# Patient Record
Sex: Male | Born: 1965 | Race: Black or African American | Hispanic: No | Marital: Single | Smoking: Never smoker
Health system: Southern US, Community
[De-identification: ages and names within clinical notes are randomized; demographics above are authoritative.]

## PROBLEM LIST (undated history)

## (undated) DIAGNOSIS — S46219A Strain of muscle, fascia and tendon of other parts of biceps, unspecified arm, initial encounter: Secondary | ICD-10-CM

## (undated) DIAGNOSIS — I10 Essential (primary) hypertension: Secondary | ICD-10-CM

## (undated) DIAGNOSIS — G4730 Sleep apnea, unspecified: Secondary | ICD-10-CM

## (undated) DIAGNOSIS — E785 Hyperlipidemia, unspecified: Secondary | ICD-10-CM

## (undated) DIAGNOSIS — M1990 Unspecified osteoarthritis, unspecified site: Secondary | ICD-10-CM

## (undated) DIAGNOSIS — Z6841 Body Mass Index (BMI) 40.0 and over, adult: Secondary | ICD-10-CM

## (undated) DIAGNOSIS — I214 Non-ST elevation (NSTEMI) myocardial infarction: Secondary | ICD-10-CM

## (undated) DIAGNOSIS — S82899A Other fracture of unspecified lower leg, initial encounter for closed fracture: Secondary | ICD-10-CM

## (undated) DIAGNOSIS — S43439A Superior glenoid labrum lesion of unspecified shoulder, initial encounter: Secondary | ICD-10-CM

## (undated) DIAGNOSIS — E039 Hypothyroidism, unspecified: Secondary | ICD-10-CM

## (undated) DIAGNOSIS — I219 Acute myocardial infarction, unspecified: Secondary | ICD-10-CM

## (undated) DIAGNOSIS — S93439A Sprain of tibiofibular ligament of unspecified ankle, initial encounter: Secondary | ICD-10-CM

## (undated) HISTORY — DX: Hypothyroidism, unspecified: E03.9

## (undated) HISTORY — PX: HERNIA REPAIR: SHX51

## (undated) HISTORY — DX: Morbid (severe) obesity due to excess calories: E66.01

## (undated) HISTORY — DX: Sprain of tibiofibular ligament of unspecified ankle, initial encounter: S93.439A

## (undated) HISTORY — DX: Non-ST elevation (NSTEMI) myocardial infarction: I21.4

## (undated) HISTORY — PX: WISDOM TOOTH EXTRACTION: SHX21

## (undated) HISTORY — DX: Unspecified osteoarthritis, unspecified site: M19.90

## (undated) HISTORY — DX: Hyperlipidemia, unspecified: E78.5

## (undated) HISTORY — PX: ANKLE SURGERY: SHX546

## (undated) HISTORY — DX: Superior glenoid labrum lesion of unspecified shoulder, initial encounter: S43.439A

## (undated) HISTORY — DX: Body Mass Index (BMI) 40.0 and over, adult: Z684

## (undated) HISTORY — DX: Other fracture of unspecified lower leg, initial encounter for closed fracture: S82.899A

## (undated) HISTORY — PX: KNEE ARTHROSCOPY: SUR90

## (undated) HISTORY — PX: THUMB ARTHROSCOPY: SHX2509

## (undated) HISTORY — PX: COLONOSCOPY: SHX174

## (undated) HISTORY — PX: BACK SURGERY: SHX140

---

## 1998-06-10 ENCOUNTER — Emergency Department (HOSPITAL_COMMUNITY): Admission: EM | Admit: 1998-06-10 | Discharge: 1998-06-10 | Payer: Self-pay | Admitting: Emergency Medicine

## 2004-07-27 ENCOUNTER — Ambulatory Visit: Payer: Self-pay | Admitting: Family Medicine

## 2005-12-22 ENCOUNTER — Ambulatory Visit: Payer: Self-pay | Admitting: Family Medicine

## 2007-05-10 ENCOUNTER — Ambulatory Visit: Payer: Self-pay | Admitting: Family Medicine

## 2007-05-11 LAB — CONVERTED CEMR LAB
ALT: 25 units/L (ref 0–53)
AST: 26 units/L (ref 0–37)
Albumin: 3.4 g/dL — ABNORMAL LOW (ref 3.5–5.2)
Alkaline Phosphatase: 70 units/L (ref 39–117)
BUN: 11 mg/dL (ref 6–23)
Basophils Absolute: 0 10*3/uL (ref 0.0–0.1)
Basophils Relative: 0.3 % (ref 0.0–1.0)
Bilirubin, Direct: 0.1 mg/dL (ref 0.0–0.3)
CO2: 31 meq/L (ref 19–32)
Calcium: 9.5 mg/dL (ref 8.4–10.5)
Chloride: 104 meq/L (ref 96–112)
Cholesterol: 229 mg/dL (ref 0–200)
Creatinine, Ser: 1.1 mg/dL (ref 0.4–1.5)
Direct LDL: 179.1 mg/dL
Eosinophils Absolute: 0.5 10*3/uL (ref 0.0–0.6)
Eosinophils Relative: 7.5 % — ABNORMAL HIGH (ref 0.0–5.0)
GFR calc Af Amer: 94 mL/min
GFR calc non Af Amer: 78 mL/min
Glucose, Bld: 91 mg/dL (ref 70–99)
HCT: 41.3 % (ref 39.0–52.0)
HDL: 35.4 mg/dL — ABNORMAL LOW (ref >39.0)
Hemoglobin: 14.3 g/dL (ref 13.0–17.0)
Lymphocytes Relative: 33.1 % (ref 12.0–46.0)
MCHC: 34.6 g/dL (ref 30.0–36.0)
MCV: 85.6 fL (ref 78.0–100.0)
Monocytes Absolute: 0.7 10*3/uL (ref 0.2–0.7)
Monocytes Relative: 9.6 % (ref 3.0–11.0)
Neutro Abs: 3.4 10*3/uL (ref 1.4–7.7)
Neutrophils Relative %: 49.5 % (ref 43.0–77.0)
Platelets: 256 10*3/uL (ref 150–400)
Potassium: 4.5 meq/L (ref 3.5–5.1)
RBC: 4.82 M/uL (ref 4.22–5.81)
RDW: 13.6 % (ref 11.5–14.6)
Sodium: 142 meq/L (ref 135–145)
TSH: 7.26 microintl units/mL — ABNORMAL HIGH (ref 0.35–5.50)
Total Bilirubin: 1 mg/dL (ref 0.3–1.2)
Total CHOL/HDL Ratio: 6.5
Total Protein: 7.9 g/dL (ref 6.0–8.3)
Triglycerides: 78 mg/dL (ref 0–149)
VLDL: 16 mg/dL (ref 0–40)
WBC: 6.9 10*3/uL (ref 4.5–10.5)

## 2007-08-13 ENCOUNTER — Ambulatory Visit: Payer: Self-pay | Admitting: Family Medicine

## 2007-08-14 LAB — CONVERTED CEMR LAB
ALT: 20 units/L (ref 0–53)
AST: 19 units/L (ref 0–37)
Cholesterol: 239 mg/dL (ref 0–200)
Direct LDL: 183.9 mg/dL
Free T4: 0.7 ng/dL (ref 0.6–1.6)
HDL: 34.2 mg/dL — ABNORMAL LOW (ref >39.0)
TSH: 9.4 microintl units/mL — ABNORMAL HIGH (ref 0.35–5.50)
Total CHOL/HDL Ratio: 7
Triglycerides: 112 mg/dL (ref 0–149)
VLDL: 22 mg/dL (ref 0–40)

## 2009-07-20 ENCOUNTER — Ambulatory Visit: Payer: Self-pay | Admitting: Family Medicine

## 2009-07-20 DIAGNOSIS — M2020 Hallux rigidus, unspecified foot: Secondary | ICD-10-CM | POA: Insufficient documentation

## 2009-12-14 ENCOUNTER — Ambulatory Visit: Payer: Self-pay | Admitting: Family Medicine

## 2010-01-04 ENCOUNTER — Ambulatory Visit: Payer: Self-pay | Admitting: Family Medicine

## 2010-01-04 DIAGNOSIS — R5383 Other fatigue: Secondary | ICD-10-CM

## 2010-01-04 DIAGNOSIS — R5381 Other malaise: Secondary | ICD-10-CM | POA: Insufficient documentation

## 2010-01-05 LAB — CONVERTED CEMR LAB
ALT: 21 units/L (ref 0–53)
AST: 21 units/L (ref 0–37)
Albumin: 4.1 g/dL (ref 3.5–5.2)
Alkaline Phosphatase: 66 units/L (ref 39–117)
BUN: 11 mg/dL (ref 6–23)
Basophils Absolute: 0.1 10*3/uL (ref 0.0–0.1)
Basophils Relative: 0.7 % (ref 0.0–3.0)
Bilirubin, Direct: 0.2 mg/dL (ref 0.0–0.3)
CO2: 31 meq/L (ref 19–32)
Calcium: 9.3 mg/dL (ref 8.4–10.5)
Chloride: 103 meq/L (ref 96–112)
Cholesterol: 262 mg/dL — ABNORMAL HIGH (ref 0–200)
Creatinine, Ser: 1.1 mg/dL (ref 0.4–1.5)
Direct LDL: 210 mg/dL
Eosinophils Absolute: 0.5 10*3/uL (ref 0.0–0.7)
Eosinophils Relative: 5.7 % — ABNORMAL HIGH (ref 0.0–5.0)
GFR calc non Af Amer: 91.07 mL/min (ref >60)
Glucose, Bld: 86 mg/dL (ref 70–99)
HCT: 43.6 % (ref 39.0–52.0)
HDL: 41.7 mg/dL (ref >39.00)
Hemoglobin: 14.5 g/dL (ref 13.0–17.0)
Lymphocytes Relative: 33.1 % (ref 12.0–46.0)
Lymphs Abs: 2.7 10*3/uL (ref 0.7–4.0)
MCHC: 33.1 g/dL (ref 30.0–36.0)
MCV: 86.9 fL (ref 78.0–100.0)
Monocytes Absolute: 0.8 10*3/uL (ref 0.1–1.0)
Monocytes Relative: 9.2 % (ref 3.0–12.0)
Neutro Abs: 4.2 10*3/uL (ref 1.4–7.7)
Neutrophils Relative %: 51.3 % (ref 43.0–77.0)
PSA: 0.38 ng/mL (ref 0.10–4.00)
Platelets: 247 10*3/uL (ref 150.0–400.0)
Potassium: 4.4 meq/L (ref 3.5–5.1)
RBC: 5.02 M/uL (ref 4.22–5.81)
RDW: 15.7 % — ABNORMAL HIGH (ref 11.5–14.6)
Sodium: 144 meq/L (ref 135–145)
TSH: 16.58 microintl units/mL — ABNORMAL HIGH (ref 0.35–5.50)
Total Bilirubin: 0.9 mg/dL (ref 0.3–1.2)
Total CHOL/HDL Ratio: 6
Total Protein: 7.3 g/dL (ref 6.0–8.3)
Triglycerides: 128 mg/dL (ref 0.0–149.0)
VLDL: 25.6 mg/dL (ref 0.0–40.0)
WBC: 8.2 10*3/uL (ref 4.5–10.5)

## 2010-01-06 ENCOUNTER — Ambulatory Visit: Payer: Self-pay | Admitting: Family Medicine

## 2010-01-06 DIAGNOSIS — E039 Hypothyroidism, unspecified: Secondary | ICD-10-CM | POA: Insufficient documentation

## 2010-01-06 DIAGNOSIS — E785 Hyperlipidemia, unspecified: Secondary | ICD-10-CM | POA: Insufficient documentation

## 2010-04-06 ENCOUNTER — Encounter (INDEPENDENT_AMBULATORY_CARE_PROVIDER_SITE_OTHER): Payer: Self-pay | Admitting: *Deleted

## 2010-06-29 NOTE — Assessment & Plan Note (Signed)
Summary: DERMITITIS??   Vital Signs:  Patient profile:   45 year old male Weight:      319.13 pounds Temp:     97.7 degrees F oral Pulse rate:   76 / minute Pulse rhythm:   regular BP sitting:   128 / 90  (left arm) Cuff size:   large  Vitals Entered By: Janee Morn, CMA 12/14/09, 8:15am CC: ? Dermitiis   History of Present Illness:  Dermatitis on lower abdomen, diffusely, has used some cream from Armida Sans, some antibiotic oitnment and Carmex.  Itchy and painful.   ROS: GEN: No acute illnesses, no fevers, chills, sweats, fatigue, weight loss, or URI sx. GI: No n/v/d Pulm: No SOB, cough, wheezing Interactive and getting along well at home.  Otherwise, ROS is as per the HPI.   GEN: Well-developed,well-nourished,in no acute distress; alert,appropriate and cooperative throughout examination HEENT: Normocephalic and atraumatic without obvious abnormalities. No apparent alopecia or balding. Ears, externally no deformities large ventral hernia PULM: Breathing comfortably in no respiratory distress EXT: No clubbing, cyanosis, or edema PSYCH: Normally interactive. Cooperative during the interview. Pleasant. Friendly and conversant. Not anxious or depressed appearing. Normal, full affect.   SKIN: very large area of unelevated scale on abdomen  Allergies (verified): No Known Drug Allergies  Past History:  Past medical, surgical, family and social histories (including risk factors) reviewed, and no changes noted (except as noted below).  Past Medical History: Reviewed history from 07/20/2009 and no changes required. h/o complete syndesmotic disruption and fixation, L h/o ankle fx L, x 2 Morbidly obese  Past Surgical History: Reviewed history from 05/09/2007 and no changes required. Ankle surgery Right knee arthroscopies Left thumb surgery PMH-FH-SH reviewed-no changes except otherwise noted  Family History: Reviewed history from 05/10/2007 and no changes  required. distant maternal relatives with DM no heart disease in family mother HTN  Social History: Reviewed history from 05/09/2007 and no changes required. Marital Status: Married Children:  Occupation: Midwife- PE teacher   Impression & Recommendations:  Problem # 1:  CONTACT DERMATITIS&OTHER ECZEMA DUE UNSPEC CAUSE (ICD-692.9)  His updated medication list for this problem includes:    Triamcinolone Acetonide 0.1 % Crea (Triamcinolone acetonide) .Marland Kitchen... Apply two times a day to affected areas  Complete Medication List: 1)  Triamcinolone Acetonide 0.1 % Crea (Triamcinolone acetonide) .... Apply two times a day to affected areas  Patient Instructions: 1)  Prephysical Labs, several days before, fasting 2)  BMP, HFP, FLP, CBC with diff, TSH, PSA: v77.91, v77.1, ,780.79, v76.44  Prescriptions: TRIAMCINOLONE ACETONIDE 0.1 % CREA (TRIAMCINOLONE ACETONIDE) Apply two times a day to affected areas  #1 pound jar x 1   Entered and Authorized by:   Hannah Beat MD   Signed by:   Hannah Beat MD on 12/14/2009   Method used:   Electronically to        [facility]  [address] (retail)       [address]       [place], [place]  [address]       Ph: [phone] or [phone]       Fax: [phone]   RxID:   [id number]   Current Allergies (reviewed today): No known allergies

## 2010-06-29 NOTE — Assessment & Plan Note (Signed)
Summary: PAIN IN BOTH FEET   Vital Signs:  Patient profile:   45 year old male Height:      72 inches Weight:      317.4 pounds BMI:     43.20 Temp:     98.4 degrees F oral Pulse rate:   84 / minute Pulse rhythm:   regular BP sitting:   130 / 90  (left arm) Cuff size:   large  Vitals Entered By: Benny Lennert CMA Duncan Dull) (July 20, 2009 10:25 AM)  History of Present Illness: Chief complaint pain in both feet  45 year old male, BMI 21 with B foot pain:  For about the last four or five months, but maybe even last spring with pain, R > L. More medially compared to laterally.  Woke up one morning and could not walk until he put his shoes on.  Has been happening more and more.   When standing long times, it will hurt a long time. 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    L ankle: had some surgery - syndesmotic screw placed. Bad high grade ankle sprain and broke ankle twice in college.   Right now the right is hurting more.  Allergies (verified): No Known Drug Allergies  Past History:  Past medical, surgical, family and social histories (including risk factors) reviewed, and no changes noted (except as noted below).  Past Medical History: h/o complete syndesmotic disruption and fixation, L h/o ankle fx L, x 2 Morbidly obese  Past Surgical History: Reviewed history from 05/09/2007 and no changes required. Ankle surgery Right knee arthroscopies Left thumb surgery  Family History: Reviewed history from 05/10/2007 and no changes required. distant maternal relatives with DM no heart disease in family mother HTN  Social History: Reviewed history from 05/09/2007 and no changes required. Marital Status: Married Children:  Occupation: Midwife- PE teacher  Review of Systems       REVIEW OF SYSTEMS  GEN: No systemic complaints, no fevers, chills, sweats, or other acute illnesses MSK: Detailed in the HPI GI:  tolerating PO intake without difficulty Neuro: No numbness, parasthesias, or tingling associated. Otherwise the pertinent positives of the ROS are noted above.    Physical Exam  General:  GEN: Well-developed,well-nourished,in no acute distress; alert,appropriate and cooperative throughout examination HEENT: Normocephalic and atraumatic without obvious abnormalities. No apparent alopecia or balding. Ears, externally no deformities PULM: Breathing comfortably in no respiratory distress EXT: No clubbing, cyanosis, or edema PSYCH: Normally interactive. Cooperative during the interview. Pleasant. Friendly and conversant. Not anxious or depressed appearing. Normal, full affect.  Msk:  B FEET Echymosis: no Edema: no ROM: full LE B Gait: heel toe, severe pronation MT pain: no Lateral Mall: NT Medial Mall: NT Talus: NT Navicular: NT Cuboid: NT Calcaneous: NT Metatarsals: NT 5th MT: NT Phalanges: NT Achilles: NT Plantar Fascia: NT Fat Pad: NT Peroneals: mild TTP, R > L Post Tib: TTP, R > L Great Toe: approx 40% loss of motion Ant Drawer: neg ATFL: NT CFL: NT Deltoid: NT Long arch: severe pes planus Transverse arch: severe breakdown with large bunions, bunionettes Hindfoot breakdown: moderate to severe Sensation: intact    Impression & Recommendations:  Problem # 1:  TENDINITIS TIBIALIS (ICD-726.72) Complex foot case, severe foot breakdown, leading to probable pT and peroneal tendonitis. 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 Apply to affected area 4 times daily  Patient Instructions: 1)  Excellent Over the Counter Orthotics: 2)   ***Hapad: available at www.hapad.com 3)  ***SPENCO: Available at some sports stores or www.amazon.com 4)  SHOES: Danskos, Merrells, Keens, Clarks - good arch support, want minimal bendability 5)  [facility] [place]: excellent staff, shoe selection, OTC orthotics 6)  Shoes: Birkenstock shoes, [employer] 7)  ***[employer], [address] [place], [place]***  8)  BALANCING EXERCISES 9)  SCRUNCH UP TOWEL WITH TOES 10)  PICK UP PING PONG BALLS WITH TOES  11)  BALANCE ON ONE FOOT 12)  Prephysical Labs, several days before, fasting 13)  BMP, HFP, FLP, CBC with diff, TSH, PSA: v77.91, v77.1, ,780.79, v76.44  Prescriptions: VOLTAREN 1 %  GEL (DICLOFENAC SODIUM) Apply to affected area 4 times daily  #5 tubes x 11   Entered and Authorized by:   Hannah Beat MD   Signed by:   Hannah Beat MD on 07/20/2009   Method used:   Print then Give to Patient   RxID:   [id number]   Current Allergies (reviewed today): No known allergies

## 2010-06-29 NOTE — Assessment & Plan Note (Signed)
Summary: CPX./DLO   Vital Signs:  Patient profile:   45 year old male Height:      72 inches Weight:      315.6 pounds BMI:     42.96 Temp:     99.0 degrees F oral Pulse rate:   76 / minute Pulse rhythm:   regular BP sitting:   130 / 90  (left arm) Cuff size:   large  Vitals Entered By: Benny Lennert CMA Duncan Dull) (January 06, 2010 2:34 PM)  History of Present Illness: Chief complaint cpx  45 year old male:  Hyperlipidemia: significantly elevated lipid panel with an LDL greater than 200. Not on any current medication. More than obesity, and an active.  colon prostate, father with a current history of prostate cancer  thyroid: significantly elevated TSH, new diagnosis of hypothyroidism  fungus:RIGHT underarm and anterior abdomen, lower with a hypopigmented area of some minimal scale with some evidence of excoriation  in and around  and under the pannus area and into the groin folds.  Clinical Review Panels:  Prevention   Last PSA:  0.38 (01/04/2010)  Lipid Management   Cholesterol:  262 (01/04/2010)   LDL (bad choesterol):  DEL (08/13/2007)   HDL (good cholesterol):  41.70 (01/04/2010)  CBC   WBC:  8.2 (01/04/2010)   RBC:  5.02 (01/04/2010)   Hgb:  14.5 (01/04/2010)   Hct:  43.6 (01/04/2010)   Platelets:  247.0 (01/04/2010)   MCV  86.9 (01/04/2010)   MCHC  33.1 (01/04/2010)   RDW  15.7 (01/04/2010)   PMN:  51.3 (01/04/2010)   Lymphs:  33.1 (01/04/2010)   Monos:  9.2 (01/04/2010)   Eosinophils:  5.7 (01/04/2010)   Basophil:  0.7 (01/04/2010)  Complete Metabolic Panel   Glucose:  86 (01/04/2010)   Sodium:  144 (01/04/2010)   Potassium:  4.4 (01/04/2010)   Chloride:  103 (01/04/2010)   CO2:  31 (01/04/2010)   BUN:  11 (01/04/2010)   Creatinine:  1.1 (01/04/2010)   Albumin:  4.1 (01/04/2010)   Total Protein:  7.3 (01/04/2010)   Calcium:  9.3 (01/04/2010)   Total Bili:  0.9 (01/04/2010)   Alk Phos:  66 (01/04/2010)   SGPT (ALT):  21 (01/04/2010)   SGOT  (AST):  21 (01/04/2010)   Allergies (verified): No Known Drug Allergies  Past History:  Past medical, surgical, family and social histories (including risk factors) reviewed, and no changes noted (except as noted below).  Past Medical History: Reviewed history from 07/20/2009 and no changes required. h/o complete syndesmotic disruption and fixation, L h/o ankle fx L, x 2 Morbidly obese  Past Surgical History: Reviewed history from 05/09/2007 and no changes required. Ankle surgery Right knee arthroscopies Left thumb surgery  Family History: Reviewed history from 05/10/2007 and no changes required. distant maternal relatives with DM no heart disease in family mother HTN  Social History: Reviewed history from 05/09/2007 and no changes required. 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 Eyes: Denies blurring. ENT: Denies earache, ear discharge, decreased hearing, nasal congestion, and sore throat. CV: Denies chest pains, dyspnea on exertion, palpitations, and syncope. Resp: Denies cough, cough with exercise, dyspnea at rest, excessive sputum, nighttime cough or wheeze, and wheezing GI: Denies nausea, vomiting, diarrhea, constipation, change in bowel habits, abdominal pain, melena, BRBPR  GU: dysuria, discharge, frequency,genital sores, STD concern. MS: no back pain, joint pain, stiffness, and arthritis. Derm:as above Neuro:  No abnormal gait, frequent headaches, paresthesias, seizures, vertigo, and weakness Psych: No anxiety, behavioral problems, compulsive behavior, depression, hyperactivity, and inattentive.having some difficulties at home with his wife, there currently living in separate bedrooms, and concern that he may ultimately get divorced. Endo: No polydipsia, polyphagia, polyuria, and unusual weight change Heme: No bruising or LAD Allergy: No urticaria  or hayfever   Otherwise, the pertinent positives and negatives are listed above and in the HPI, otherwise a full review of systems has been reviewed and is negative unless noted positive.    Impression & Recommendations:  Problem # 1:  HEALTH MAINTENANCE EXAM (ICD-V70.0) The patient's preventative maintenance and recommended screening tests for an annual wellness exam were reviewed in full today. Brought up to date unless services declined.  Counselled on the importance of diet, exercise, and its role in overall health and mortality. The patient's FH and SH was reviewed, including their home life, tobacco status, and drug and alcohol status.   Lose weight  Problem # 2:  HYPOTHYROIDISM (ICD-244.9) Assessment: New >25 minutes spent in face to face time with patient, >50% spent in counselling or coordination of care: this time is spent over and above health maintenance exam reviewing new onset hypothyroidism, new onset hyperlipidemia, and the patient is having some significant home stress due to marital difficulties.  We discussed his home life with  his children, stepchildren, white, in-laws, and  the discord in his household.  He could potentially have an upcoming divorce.  His updated medication list for this problem includes:    Synthroid 100 Mcg Tabs (Levothyroxine sodium) .Marland Kitchen... 1 by mouth daily  Labs Reviewed: TSH: 16.58 (01/04/2010)    Chol: 262 (01/04/2010)   HDL: 41.70 (01/04/2010)   LDL: DEL (08/13/2007)   TG: 128.0 (01/04/2010)  Problem # 3:  HYPERLIPIDEMIA (ICD-272.4) Assessment: New  His updated medication list for this problem includes:    Pravastatin Sodium 40 Mg Tabs (Pravastatin sodium) ..... One by mouth at bedtime  Labs Reviewed: SGOT: 21 (01/04/2010)   SGPT: 21 (01/04/2010)   HDL:41.70 (01/04/2010), 34.2 (08/13/2007)  LDL:DEL (08/13/2007), DEL (05/10/2007)  Chol:262 (01/04/2010), 239 (08/13/2007)  Trig:128.0 (01/04/2010), 112 (08/13/2007)  Problem # 4:   COUNSELING MARITAL&PARTNER PROBLEMS UNSPECIFIED (ICD-V61.10) Assessment: New  Complete Medication List: 1)  Triamcinolone Acetonide 0.1 % Crea (Triamcinolone acetonide) .... Apply two times a day to affected areas 2)  Pravastatin Sodium 40 Mg Tabs (Pravastatin sodium) .... One by mouth at bedtime 3)  Synthroid 100 Mcg Tabs (Levothyroxine sodium) .Marland Kitchen.. 1 by mouth daily  Patient Instructions: 1)  f/u 3 months 2)  labs 3-4 days before 3)  Hepatic Panel prior to visit ICD-9: v58.69 4)  Lipid panel prior to visit ICD-9 : 272.4 5)  TSH prior to visit ICD-9 : Free T4, Free T3, 244.9 6)  ADD IN TOPICAL CLOTRIMAZOLE CREAM 2-3 TIMES A DAY Prescriptions: SYNTHROID 100 MCG TABS (LEVOTHYROXINE SODIUM) 1 by mouth daily  #30 x 5   Entered and Authorized by:   Hannah Beat MD   Signed by:   Hannah Beat MD on 01/06/2010   Method used:   Print then Give to Patient   RxID:   [id number] PRAVASTATIN SODIUM 40 MG  TABS (PRAVASTATIN SODIUM) one by mouth at bedtime  #90 x 3   Entered and Authorized by:   Hannah Beat MD   Signed by:   Hannah Beat MD on 01/06/2010   Method used:   Print then Give to Patient   RxID:  [id number]   Current Allergies (reviewed today): No known allergies    General Medical Physical Exam:  General Appearance:      GEN: Well-developed,well-nourished,in no acute distress; alert,appropriate and cooperative throughout examination HEENT: Normocephalic and atraumatic without obvious abnormalities. No apparent alopecia or balding. Ears, externally no deformities PULM: Breathing comfortably in no respiratory distress EXT: No clubbing, cyanosis, or edema PSYCH: Normally interactive. Cooperative during the interview. Pleasant. Friendly and conversant. Not anxious or depressed appearing. Normal, full affect.   Head:      Inspection:     normocephalic without obvious abnormalities      Palpation:     no abnormal lesions palpable  Eyes:      External:      conjunctiva and lids normal      Pupils:     equal, round, and reactive to light and accommodation      Fundus:     discs sharp and flat; no a/v nicking, hemorrhages, or exudates  Ears, Nose, Throat:      External:     normocephalic, atraumatic, and no abnormalities observed.  no sinus tenderness      Otoscopic:     canals clear; tympanic membranes intact with normal light reflex      Hearing:     grossly intact      Nasal:     mucosal erythema and mucosal edema.        Dental:     good dentition      Pharynx:     tongue normal; posterior pharynx without erythema or exudate  Neck:      Neck:     supple; no masses; trachea midline      Thyroid:     no nodules, masses, tenderness, or enlargement  Respiratory:      Resp. effort:     no intercostal retractions or use of accessory muscles      Percussion:     no dullness      Palpation:     normal fremitus      Auscultation:     no rales, rhonchi, or wheezes  Chest Wall:      Chest wall:     no masses or gynecomastia      Axilla:     no axillary adenopathy  Cardiovascular:      Palpation:     no thrill or displacement of PMI      Auscultation:     normal S1 and  S2; no murmur, rub, or gallop  Gastrointestinal:      Abdomen:     soft and non-tender with normal bowel sounds; no masses      Liver/spleen:     normal to percussion; no enlargement or nodularity      Hernia:     no hernias      Rectal:     no masses or tenderness      Stool:     not done  Genitourinary:      Scrotum:     no lesions, cysts, edema, or rash      Penis:     no lesions or discharge      Prostate:     no enlargement or nodularity  Musculoskeletal:      Gait/station:     normal gait; no ataxia  Lymphatic:      Neck:     no cervical adenopathy      Inguinal:     no inguinal adenopathy  Skin:      Inspection:     as above, lichenified, hyperkeratotic, abdomen, right underarm  Neurological:      Sensory:     intact to touch  Psychiatric:       Judgement:     intact      Orientation:     oriented to time, place, and person      Memory:     intact for recent and remote events      Mood/affect:     no appearance of anxiety, depression, or agitation

## 2010-06-29 NOTE — Assessment & Plan Note (Signed)
Summary: REVIEW RESULTS /CLE   Vital Signs:  Patient Profile:   45 Years Old Male Weight:      319 pounds Temp:     97.9 degrees F oral Pulse rate:   72 / minute Pulse rhythm:   regular BP sitting:   142 / 100  (left arm) Cuff size:   large  Vitals Entered By: Lowella Petties (May 10, 2007 8:48 AM)                 Chief Complaint:  Review test results.  History of Present Illness: had gone to a PE convention- and did a free screening chol total was 265 HDL 30- no LDL sugar 100 blood pressure 127/90- pulse 95  lifestyle habits are not optimal is busy coaching- too busy for breakfast, lunch is leftovers is trying to avoid red meat- but wife fixes tacos and hamburgers does eat some veg and fruit does eat too much portion wise not a lot of salt- but does eat salty food    Current Allergies (reviewed today): No known allergies    Family History:    distant maternal relatives with DM    no heart disease in family    mother HTN   Serial Vital Signs/Assessments:  Time      Position  BP       Pulse  Resp  Temp     By                     140/90                         Judith Part MD    Review of Systems      See HPI  General      Denies chills, fatigue, loss of appetite, and weight loss.  Eyes      Denies blurring.  CV      Denies chest pain or discomfort.  Resp      Denies cough and shortness of breath.  GI      Denies abdominal pain.  MS      Complains of joint pain.      knees hurt to run- can swim and bike  Derm      Denies changes in color of skin and rash.  Neuro      Denies numbness.  Psych      mood is ok, but dissapointed in recent news  Endo      Denies excessive thirst and excessive urination.   Physical Exam  General:     overweight but generally well appearing  Head:     normocephalic, atraumatic, and no abnormalities observed.   Eyes:     vision grossly intact, pupils equal, pupils round, and pupils  reactive to light.   Mouth:     pharynx pink and moist.   Neck:     supple with full rom and no masses or thyromegally, no JVD or carotid bruit  Lungs:     Normal respiratory effort, chest expands symmetrically. Lungs are clear to auscultation, no crackles or wheezes. Heart:     Normal rate and regular rhythm. S1 and S2 normal without gallop, murmur, click, rub or other extra sounds. Abdomen:     soft and non-tender.  no renal bruits Pulses:     R and L carotid,radial,femoral,dorsalis pedis and posterior tibial pulses are full and equal bilaterally Extremities:     No  clubbing, cyanosis, edema, or deformity noted with normal full range of motion of all joints.   Neurologic:     sensation intact to light touch, gait normal, and DTRs symmetrical and normal.  -nl monofil test Skin:     Intact without suspicious lesions or rashes Cervical Nodes:     No lymphadenopathy noted Psych:     nl affect in general- pt is a little down    Impression & Recommendations:  Problem # 1:  ELEVATED BLOOD PRESSURE WITHOUT DIAGNOSIS OF HYPERTENSION (ICD-796.2) given info on lowering bp will start lifestyle change- wt loss, exercise and lowering sodium intake- disc this in detail will consider lifestyle center in future- pt states he has fairly good understanding of what to do if no imp at f/u will start med  Orders: Venipuncture (30865) TLB-BMP (Basic Metabolic Panel-BMET) (80048-METABOL) TLB-CBC Platelet - w/Differential (85025-CBCD) TLB-Hepatic/Liver Function Pnl (80076-HEPATIC) TLB-TSH (Thyroid Stimulating Hormone) (84443-TSH)   Problem # 2:  HYPERLIPIDEMIA (ICD-272.2) will work on lowering sat fat in diet, exercise, and consider omega 3 suppl checking labs today Orders: Venipuncture (78469) TLB-Lipid Panel (80061-LIPID)   Problem # 3:  OBESITY (ICD-278.00) will start working on wt loss cut portions- eat more regularly cut out junk food and snacks  start exerising 20-30 min daily   days week f/u 2-4 weeks   Patient Instructions: 1)  start healthier diet, smaller portions and exercise 2)  It is important that you exercise regularly at least 20 minutes 5 times a week. If you develop chest pain, have severe difficulty breathing, or feel very tired , stop exercising immediately and seek medical attention. 3)  follow up in about 2-4 weeks 4)  today am checking labs including cholesterol    ] Prior Medications (reviewed today): None Current Allergies (reviewed today): No known allergies

## 2010-06-29 NOTE — Letter (Signed)
Summary: [place] No Show Letter  [place] [facility]  [address] [place], [place] [address]   Phone: [phone]  Fax: [phone]    04/06/2010 MRN: [id number]  [facility] [address] [place], [place]  [address]   Dear Mr. [facility],   Our records indicate that you missed your scheduled appointment with __LAB___________________ on __11.8.2011__________.  Please contact this office to reschedule your appointment as soon as possible.  It is important that you keep your scheduled appointments with your physician, so we can provide you the best care possible.  Please be advised that there may be a charge for "no show" appointments.    Sincerely,   [place] at [facility]

## 2010-06-29 NOTE — Assessment & Plan Note (Signed)
Summary: ROA   Vital Signs:  Patient Profile:   45 Years Old Male Weight:      305 pounds Temp:     99.5 degrees F oral Pulse rate:   84 / minute Pulse rhythm:   regular BP sitting:   146 / 94  (left arm) Cuff size:   large  Vitals Entered By: Lowella Petties (August 13, 2007 10:36 AM)                 Serial Vital Signs/Assessments:  Time      Position  BP       Pulse  Resp  Temp     By                     138/90                         Judith Part MD   Chief Complaint:  Follow up.  History of Present Illness: started going to the gym since last visit -  thinks she has lost to 5- about 10-12 pounds--? then 305 this am  is also using the [employer]- likes the running and balance programs as well  is eating differently- overall  eating more soups and salads , with chicken stays away from red meat and fried foods   is overall feeling better labs from last visit HDL 35.4 - is not using any omega 3 supplements  trig 78 LDL 179.1 - very high  tsh was a little high has never had goiter or growth in neck once he started exercising energy level did go up   has been sick lately uri and cough (taking thera flu) now has some congestion in chest- cannot get rid of it  when lies down at night- cannot breath well , is wheezing some  does have hx of childhood asthma also has spring allergies  took one mucinex  has not had fever - but today 99.5 has been sick a lot more this year than in other years     Current Allergies: No known allergies    Family History:    Reviewed history from 05/10/2007 and no changes required:       distant maternal relatives with DM       no heart disease in family       mother HTN  Social History:    Reviewed history from 05/09/2007 and no changes required:       Marital Status: Married       Children:        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  no sinus tenderness Eyes:     vision grossly intact, pupils equal, pupils round, and pupils reactive to light.  no conjunctivits Ears:     R ear normal and L ear normal.   Nose:     mucosal erythema and mucosal edema.   Mouth:     pharynx pink and moist, no erythema, and no exudates.   Neck:     supple with full rom and no masses or thyromegally, no JVD or carotid bruit no tenderness Lungs:     harsh bs at bilateral bases without rales or labored breathing some faint rhonchi and scant end exp wheezes heard Heart:  Normal rate and regular rhythm. S1 and S2 normal without gallop, murmur, click, rub or other extra sounds. Pulses:     R and L carotid,radial,femoral,dorsalis pedis and posterior tibial pulses are full and equal bilaterally Extremities:     No clubbing, cyanosis, edema, or deformity noted with normal full range of motion of all joints.   Neurologic:     sensation intact to light touch, sensation intact to pinprick, gait normal, and DTRs symmetrical and normal.  no tremor Skin:     Intact without suspicious lesions or rashes Cervical Nodes:     No lymphadenopathy noted Psych:     normal affect, talkative and pleasant     Impression & Recommendations:  Problem # 1:  THYROID STIMULATING HORMONE, ABNORMAL (ICD-246.9) Assessment: New will re check tsh with FT4 today- and start tx with thyroid replacement if low pt does report some recent wt gain and lethargy- but has no prior problems with thyroid Orders: Venipuncture (82423) TLB-TSH (Thyroid Stimulating Hormone) (84443-TSH) TLB-T4 (Thyrox), Free [id number])   Problem # 2:  OBESITY (ICD-278.00) Assessment: Improved succeeding at initial effort at wt loss  enc to keep up the good work- this will continue to decrease numerous health risks  will f/u in 65mo to check on progress  Problem # 3:  HYPERLIPIDEMIA (ICD-272.2) will re  check labs today and expect improvement pt is not interested in chol med unless absolutely necessary  Orders: Venipuncture (40086) TLB-Lipid Panel (80061-LIPID) TLB-ALT (SGPT) (84460-ALT) TLB-AST (SGOT) (84450-SGOT)   Problem # 4:  ELEVATED BLOOD PRESSURE WITHOUT DIAGNOSIS OF HYPERTENSION (ICD-796.2) bp is still elevated- pt wants to continue working on diet and exercise will re eval in 3 mo  Problem # 5:  BRONCHITIS-ACUTE (ICD-466.0) with ongoing productive cough will cover with zithromax and sympt care (guifenisen ac)- urged to call/f/u if worse or not improved His updated medication list for this problem includes:    Zithromax Z-pak 250 Mg Tabs (Azithromycin) .Marland Kitchen... Take by mouth as directed    Guaifenesin-codeine 100-10 Mg/23ml Liqd (Guaifenesin-codeine) .Marland Kitchen... 1-2 tsp by mouth q 4-6 hours as needed cough    Proventil Hfa 108 (90 Base) Mcg/act Aers (Albuterol sulfate) .Marland Kitchen... 2 puffs q 4 hours as needed for wheezing or tight chest   Complete Medication List: 1)  Zithromax Z-pak 250 Mg Tabs (Azithromycin) .... Take by mouth as directed 2)  Guaifenesin-codeine 100-10 Mg/51ml Liqd (Guaifenesin-codeine) .Marland Kitchen.. 1-2 tsp by mouth q 4-6 hours as needed cough 3)  Proventil Hfa 108 (90 Base) Mcg/act Aers (Albuterol sulfate) .... 2 puffs q 4 hours as needed for wheezing or tight chest   Patient Instructions: 1)  keep up the good work with diet and exercise and weight loss 2)  try omega 3 supplement like fish oil or flax seed oil as directed to try to increase HDL (good cholesterol) 3)  for cough- take mucinex during the day and try the guifenesin with codiene at night  4)  take the zithromax as directed 5)  albuterol mdi as needed for wheezing or tightness 6)  if not improving or any shortness of breath- please update me  7)  when labs return- I will let you know if you need to start thyroid medication  8)  follow up in 3 months    Prescriptions: PROVENTIL HFA 108 (90 BASE) MCG/ACT  AERS  (ALBUTEROL SULFATE) 2 puffs q 4 hours as needed for wheezing or tight chest  #1 mdi x 1   Entered and Authorized by:  Judith Part MD   Signed by:   Judith Part MD on 08/13/2007   Method used:   Print then Give to Patient   RxID:   [id number] GUAIFENESIN-CODEINE 100-10 MG/5ML  LIQD (GUAIFENESIN-CODEINE) 1-2 tsp by mouth q 4-6 hours as needed cough  #120 cc x 0   Entered and Authorized by:   Judith Part MD   Signed by:   Judith Part MD on 08/13/2007   Method used:   Print then Give to Patient   RxID:   [id number] ZITHROMAX Z-PAK 250 MG  TABS (AZITHROMYCIN) take by mouth as directed  #1 pk x 0   Entered and Authorized by:   Judith Part MD   Signed by:   Judith Part MD on 08/13/2007   Method used:   Print then Give to Patient   RxID:   [id number]  ] Prior Medications: Current Allergies: No known allergies

## 2011-02-14 ENCOUNTER — Ambulatory Visit (INDEPENDENT_AMBULATORY_CARE_PROVIDER_SITE_OTHER): Payer: BC Managed Care – PPO | Admitting: Internal Medicine

## 2011-02-14 ENCOUNTER — Encounter: Payer: Self-pay | Admitting: Internal Medicine

## 2011-02-14 VITALS — BP 148/96 | HR 75 | Temp 98.0°F | Ht 72.0 in | Wt 328.0 lb

## 2011-02-14 DIAGNOSIS — K436 Other and unspecified ventral hernia with obstruction, without gangrene: Secondary | ICD-10-CM

## 2011-02-14 NOTE — Assessment & Plan Note (Signed)
Sounds like he had brief episode of strangulation this morning Suspect he had extension of additional bowel into hernia after moving chairs around and this caused the strangulation Better after home and lying down  Needs surgery---immediate if recurrent pain Will set up with surgeon for elective repair if pain doesn't recur

## 2011-02-14 NOTE — Progress Notes (Signed)
  Subjective:    Patient ID: Steve Ashley, male    DOB: 1966/04/19, 45 y.o.   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 Then got severe pain, hard to breathe. Went home, fell asleep for an hour or so and it improved Still hurts ---worsening when moving around  Has had protrusion above umbilicus for some time Has been able to feel but never hurt like this before  No current outpatient prescriptions on file prior to visit.    No Known Allergies  Past Medical History  Diagnosis Date  . Syndesmotic ankle sprain   . Ankle fracture   . Morbid obesity     Past Surgical History  Procedure Date  . Ankle surgery   . Knee arthroscopy     right  . Thumb arthroscopy     Family History  Problem Relation Age of Onset  . Hypertension Mother   . Heart disease Neg Hx     History   Social History  . Marital Status: Married    Spouse Name: N/A    Number of Children: N/A  . Years of Education: N/A   Occupational History  . PE teacher    Social History Main Topics  . Smoking status: Never Smoker   . Smokeless tobacco: Never Used  . Alcohol Use: No  . Drug Use: No  . Sexually Active: Not on file   Other Topics Concern  . Not on file   Social History Narrative  . No narrative on file   Review of Systems No nausea or vomiting Ate breakfast, but nothing since then Normal stool last night     Objective:   Physical Exam  Constitutional: He appears well-developed and well-nourished. No distress.  Abdominal:       Incarcerated ventral hernia just above the umbilicus Mild tenderness now No rebound          Assessment & Plan:

## 2011-02-15 ENCOUNTER — Ambulatory Visit (INDEPENDENT_AMBULATORY_CARE_PROVIDER_SITE_OTHER): Payer: Self-pay | Admitting: General Surgery

## 2011-02-18 ENCOUNTER — Encounter (INDEPENDENT_AMBULATORY_CARE_PROVIDER_SITE_OTHER): Payer: Self-pay | Admitting: General Surgery

## 2011-02-18 ENCOUNTER — Ambulatory Visit (INDEPENDENT_AMBULATORY_CARE_PROVIDER_SITE_OTHER): Payer: BC Managed Care – PPO | Admitting: General Surgery

## 2011-02-18 VITALS — BP 132/94 | HR 68 | Temp 97.1°F | Resp 12 | Ht 71.0 in | Wt 328.4 lb

## 2011-02-18 DIAGNOSIS — K436 Other and unspecified ventral hernia with obstruction, without gangrene: Secondary | ICD-10-CM

## 2011-02-18 NOTE — Progress Notes (Signed)
Chief Complaint  Patient presents with  . Other    new pt- eval ventral hernia    HPI Steve Ashley is a 45 y.o. male.  HPI 45 year old morbidly obese African American male referred by Dr. Alphonsus Sias for evaluation of an incarcerated ventral hernia. The patient states that he has had a lifelong hernia at his umbilicus. However, over the past several years the bulge above his umbilicus has slowly gotten larger. He states particularly the last 6-8 months he's been having more discomfort and intermittent episodes of pain at this bulge. This past Monday while at work he developed a sharp pain at his umbilicus. The pain got so intense at one point that he had difficulty breathing. He had to leave work early. The pain eventually decrease in intensity. He denies any nausea or vomiting. He denies any fevers or chills. He denies any diarrhea or constipation. He denies any melena or hematochezia. He denies any prior abdominal surgery. 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 Syndesmotic ankle sprain   . Ankle fracture   . Morbid obesity   . Arthritis   . Asthma   . Hyperlipidemia     Past Surgical History  Procedure Date  . Ankle surgery   . Knee arthroscopy     right  . Thumb arthroscopy     Family History  Problem Relation Age of Onset  . Hypertension Mother   . Heart disease Neg Hx   . Cancer Father     prostate    Social History History  Substance Use Topics  . Smoking status: Never Smoker   . Smokeless tobacco: Never Used  . Alcohol Use: No    No Known Allergies  No current outpatient prescriptions on file.    Review of Systems Review of Systems  Constitutional: Negative for fever, chills, appetite change and unexpected weight change.  HENT: Negative for hearing loss and neck pain.   Eyes: Negative for photophobia and visual disturbance.  Respiratory: Negative for cough, chest tightness and shortness of  breath.        +snoring  Cardiovascular: Negative for chest pain and leg swelling.       Denies DOE, orthopnea, PND  Gastrointestinal: Negative for nausea, vomiting, diarrhea, constipation and blood in stool.  Genitourinary: Positive for urgency. Negative for dysuria, decreased urine volume, discharge and penile pain.  Musculoskeletal: Negative.   Neurological: Negative.   Hematological: Negative.   Psychiatric/Behavioral: Negative.        Some stress, separated from wife    Blood pressure 132/94, pulse 68, temperature 97.1 F (36.2 C), temperature source Temporal, resp. rate 12, height 5\' 11"  (1.803 m), weight 328 lb 6.4 oz (148.961 kg).  Physical Exam Physical Exam  Constitutional: He is oriented to person, place, and time. He appears well-developed and well-nourished.       Morbidly obese  HENT:  Head: Normocephalic and atraumatic.       Short thick neck  Eyes: Conjunctivae are normal. No scleral icterus.  Neck: Normal range of motion. No JVD present. No tracheal deviation present.  Cardiovascular: Normal rate, regular rhythm and normal heart sounds.   Pulmonary/Chest: Effort normal and breath sounds normal. No respiratory distress. He has no wheezes.  Abdominal: Soft. Bowel sounds are normal. He exhibits no distension. There is no rebound and no guarding.    Musculoskeletal: Normal range of motion.  Lymphadenopathy:    He has no  cervical adenopathy.  Neurological: He is alert and oriented to person, place, and time. He exhibits normal muscle tone.  Skin: Skin is warm and dry.       Hyperpigmentation of lower abd wall c/w acanthosis nigricans  Psychiatric: He has a normal mood and affect. His behavior is normal. Judgment and thought content normal.    Data Reviewed Referring provider's note Labs from 12/2009 which showed elevated cholesterol  Assessment    Morbid obesity Hyperlipidemia Incarcerated Ventral Hernia    Plan    We discussed the etiology of ventral  hernias. He was given Agricultural engineer. We discussed the signs and symptoms of strangulation. He was told what he should do should those symptoms occur.  Because his hernia is incarcerated, I have recommended repair. Obviously he is at higher risk for recurrence and perioperative complications given his morbid obesity. We discussed both open and laparoscopic approaches. We discussed the risks and benefits of surgery including but not limited to bleeding, infection, injury to surrounding structures, blood clot formation, general anesthesia risk, prolonged abdominal pain, urinary retention, mesh complications, and hernia recurrence. We discussed with an open repair, that there would be surgical drains postoperatively but we would be able to reapproximate his abdominal wall muscle. We discussed with the laparoscopic repair there is been no surgical drains but we would not be able to reapproximate his abdominal muscles. We discussed the typical postoperative course with each procedure.  I would like to order CT scan of his abdomen and pelvis to further delineate his abdominal wall anatomy. It is hard on physical exam to appreciate the actual defect size due to his body habitus.  The patient states that he has a lot going on right now. He would like to delay surgery until January if possible. I explained to him that I cannot force him to have surgery. I explained to him that he is at risk for strangulation. If he presented with a strangulated hernia that would definitely add to his postoperative recovery time, potentially result in small bowel resection, and potentially result in a less than ideal hernia repair.  He is going to think about what we've talked about today and let us know how he would like to proceed. He states that he will call us with his plan.  Mary Sella. Andrey Campanile, MD       Gaynelle Adu Judie Petit 02/18/2011, 2:05 PM

## 2011-02-18 NOTE — Patient Instructions (Signed)
Call us at [phone] when you have decided if you would like surgery   Hernia A hernia occurs when an internal organ pushes out through a weak spot in the belly (abdominal) wall. Hernias most commonly occur in the groin and around the navel. Hernias also can occur through by cut (incision) made by the surgeon after an abdominal operation. Hernias often can be pushed back into place (reduced). Most hernias tend to get worse over time. Problems occur when abdominal contents get stuck in the opening (incarcerated hernia). The blood supply becomes blocked or impaired (strangulated hernia). Because of these risks, you may require surgery to repair the hernia. CAUSES  Heavy lifting.   Prolonged coughing.   Straining to move your bowels.   Hernias can also occur through a cut (incision) by a surgeon after an abdominal operation.  HOME CARE INSTRUCTIONS  Bed rest is not required. You may continue your normal activities. Avoid heavy lifting (more than 10 pounds) or straining. Cough gently. If you are a smoker it is best to stop. Even the best hernia repair can break down with the continual strain of coughing. Even if you do not have your hernia repaired, a cough will continue to aggravate the problem.   Do not wear anything tight over your hernia. Do not try to keep it in with an outside bandage or truss. These can damage abdominal contents if they are trapped within the hernia sac.   Eat a normal diet. Avoid constipation. Straining over long periods of time will increase hernia size and encourage breakdown of repairs. If you cannot do this with diet alone, stool softeners may be used.  SEEK IMMEDIATE MEDICAL CARE IF: You have problems (symptoms) of a trapped (incarcerated) hernia:  You develop an oral temperature above 101, or as your caregiver suggests.   You develop increasing abdominal pain.   You feel sick to your stomach (nausea) and vomiting.   The hernia is stuck outside the abdomen,  looks discolored, feels hard, or is tender.   You have any changes in your bowel habits or in the hernia that is unusual for you.   You have increased pain or swelling around the hernia.   You cannot push the hernia back in place by applying gentle pressure while lying down.  MAKE SURE YOU:   Understand these instructions.   Will watch your condition.   Will get help right away if you are not doing well or get worse.  Document Released: 05/16/2005 Document Re-Released: 03/13/2009 [facility] Patient Information 2011 [place], [place].

## 2011-03-24 ENCOUNTER — Other Ambulatory Visit: Payer: BC Managed Care – PPO

## 2011-03-29 ENCOUNTER — Encounter: Payer: BC Managed Care – PPO | Admitting: Family Medicine

## 2011-07-08 ENCOUNTER — Ambulatory Visit: Payer: BC Managed Care – PPO | Admitting: Family Medicine

## 2011-11-21 ENCOUNTER — Emergency Department: Payer: Self-pay | Admitting: Emergency Medicine

## 2011-11-21 ENCOUNTER — Telehealth (INDEPENDENT_AMBULATORY_CARE_PROVIDER_SITE_OTHER): Payer: Self-pay | Admitting: General Surgery

## 2011-11-21 NOTE — Telephone Encounter (Signed)
Pt calling, complaining of severe pain at hernia site.  He has been given an appt for this week, but is lying on his back with an ice pack to the site.  Advised pt if pain is intractable, he needs to go to the ER immediately.  He can go to [facility] (closest ER) or drive to [place].  He understands, but will not commit to go at this time.

## 2011-11-25 ENCOUNTER — Ambulatory Visit (INDEPENDENT_AMBULATORY_CARE_PROVIDER_SITE_OTHER): Payer: BC Managed Care – PPO | Admitting: General Surgery

## 2011-11-25 ENCOUNTER — Encounter (INDEPENDENT_AMBULATORY_CARE_PROVIDER_SITE_OTHER): Payer: Self-pay | Admitting: General Surgery

## 2011-11-25 VITALS — BP 148/100 | HR 98 | Temp 97.6°F | Resp 16 | Ht 71.5 in | Wt 340.4 lb

## 2011-11-25 DIAGNOSIS — K436 Other and unspecified ventral hernia with obstruction, without gangrene: Secondary | ICD-10-CM

## 2011-11-28 NOTE — Progress Notes (Signed)
Patient ID: Steve Ashley, male   DOB: March 07, 1966, 46 y.o.   MRN: [id number]  Chief Complaint  Patient presents with  . Routine Post Op    reck umb hernia, poss discuss sx LOV 02/18/11    HPI Steve Ashley is a 46 y.o. male.   HPI This is a 46 year old African American male who comes in to re-discuss ventral hernia surgery.I initially met him in September 2012. At that time, he had a partially incarcerated ventral hernia. Even though he was morbidly obese, I recommended proceeding to the operating room at that time because of my concern for possible strangulation of this hernia. The patient was going to think about it and call our office back. However he declined surgical intervention at that time. He states over the past several months he's had intermittent episodes of abdominal pain around the umbilicus. However this past Monday he developed a severe episode of pain that was extremely sharp and intense. It was the worst pain he ever had around his umbilicus. He states that it was a hard bulge at his umbilicus. He did become nauseous; however, he did not vomit. He had to leave work and go to the emergency room. He states that he went to [facility] emergency room. He states that after lying supine for several hours the hernia eventually reduced itself and he was discharged home. He denies any melena or hematochezia. He denies any fever or chills. Unfortunately HE HAS GAINED some weight since his last visit.  Past Medical History  Diagnosis Date  . Syndesmotic ankle sprain   . Ankle fracture   . Morbid obesity   . Arthritis   . Asthma   . Hyperlipidemia     Past Surgical History  Procedure Date  . Ankle surgery   . Knee arthroscopy     right  . Thumb arthroscopy     Family History  Problem Relation Age of Onset  . Hypertension Mother   . Heart disease Neg Hx   . Cancer Father     prostate    Social History History  Substance Use Topics  . Smoking status: Never  Smoker   . Smokeless tobacco: Never Used  . Alcohol Use: No    No Known Allergies  No current outpatient prescriptions on file.    Review of Systems Review of Systems  Constitutional: Negative for fever, chills, appetite change and unexpected weight change.  HENT: Negative for congestion and trouble swallowing.   Eyes: Negative for visual disturbance.  Respiratory: Negative for chest tightness and shortness of breath.        +snoring; denies DOE, PND, SOB  Cardiovascular: Negative for chest pain and leg swelling.       No PND, no orthopnea, no DOE  Gastrointestinal:       See HPI  Genitourinary: Negative for dysuria and hematuria.  Musculoskeletal: Negative.   Skin: Negative for rash.  Neurological: Negative for seizures and speech difficulty.  Hematological: Does not bruise/bleed easily.  Psychiatric/Behavioral: Negative for behavioral problems and confusion.    Blood pressure 148/100, pulse 98, temperature 97.6 F (36.4 C), temperature source Temporal, resp. rate 16, height 5' 11.5" (1.816 m), weight 340 lb 6.4 oz (154.404 kg).  Physical Exam Physical Exam  Vitals reviewed. Constitutional: He is oriented to person, place, and time. He appears well-developed and well-nourished. No distress.  HENT:  Head: Normocephalic and atraumatic.  Right Ear: External ear normal.  Left Ear: External ear normal.  Eyes: Conjunctivae  are normal. No scleral icterus.  Neck: Normal range of motion. Neck supple. No tracheal deviation present. No thyromegaly present.  Cardiovascular: Normal rate, regular rhythm and normal heart sounds.   Pulmonary/Chest: Effort normal and breath sounds normal. No stridor. No respiratory distress. He has no wheezes.  Abdominal: Soft. Bowel sounds are normal. He exhibits no distension. There is no rebound and no guarding. A hernia is present. Hernia confirmed positive in the ventral area.         Ventral hernia; partially reducible. Soft, no RT, no guarding,  very mild tenderness. No skin changes  Musculoskeletal: Normal range of motion. He exhibits no edema and no tenderness.  Neurological: He is alert and oriented to person, place, and time.  Skin: Skin is warm and dry. No rash noted. He is not diaphoretic. No erythema.  Psychiatric: He has a normal mood and affect. His behavior is normal. Judgment and thought content normal.    Data Reviewed My office note from 01/2011  Assessment    Partially Incarcerated ventral hernia Morbid obesity    Plan    We re-discussed the etiology of ventral hernias. We discussed the signs and symptoms of incarceration and strangulation. The patient was given educational material. I also drew diagrams.  We discussed nonoperative and operative management. With respect to operative management, we discussed both open repair and laparoscopic repair. We discussed the pros and cons of each approach. I discussed the typical aftercare with each procedure and how each procedure differs.  The patient has elected to proceed with LAPAROSCOPIC VENTRAL HERNIA REPAIR WITH MESH  We discussed the risk and benefits of surgery including but not limited to bleeding, infection, injury to surrounding structures, hernia recurrence, mesh complications, hematoma/seroma formation, need to convert to an open procedure, blood clot formation, urinary retention, post operative ileus, general anesthesia risk, long-term abdominal pain. We discussed that this procedure can be quite uncomfortable and difficult to recover from based on how the mesh is secured to the abdominal wall. We discussed the importance of avoiding heavy lifting and straining for a period of 6 weeks.  I explained that the he is at higher risk for recurrence because of his morbid obesity. Unfortunately rather than losing weight since the last time I saw him, he has actually gained 13 pounds. His BMI is 47. Ideally, I would like for him to at least 50 pounds or more prior to  hernia repair. But I'm concerned that he is at a high risk for strangulation. He appears to have had an episode of incarceration this past Monday and it appears it has had ongoing intermittent pain since I last saw him. Therefore, I believe the safest course of action is to proceed to the OR even though he is at an increased risk for recurrence given his size. He is not interested in weight loss surgery. The patient is also having a difficult time accepting the postoperative restrictions (weight limit). I explained that it is very important to limit his activities to allow it to heal.   Mary Sella. Andrey Campanile, MD, FACS General, Bariatric, & Minimally Invasive Surgery [facility], [place]         [facility] M 11/28/2011, 1:37 PM

## 2011-11-29 ENCOUNTER — Encounter (HOSPITAL_COMMUNITY): Payer: Self-pay | Admitting: Pharmacy Technician

## 2011-12-05 ENCOUNTER — Ambulatory Visit
Admission: RE | Admit: 2011-12-05 | Discharge: 2011-12-05 | Disposition: A | Discharge status: Home / Self Care | Payer: BC Managed Care – PPO | Source: Ambulatory Visit | Attending: General Surgery | Admitting: General Surgery

## 2011-12-05 ENCOUNTER — Encounter (HOSPITAL_COMMUNITY)
Admission: RE | Admit: 2011-12-05 | Discharge: 2011-12-05 | Disposition: A | Discharge status: Home / Self Care | Payer: BC Managed Care – PPO | Source: Ambulatory Visit | Attending: General Surgery | Admitting: General Surgery

## 2011-12-05 ENCOUNTER — Encounter (HOSPITAL_COMMUNITY): Payer: Self-pay

## 2011-12-05 DIAGNOSIS — K436 Other and unspecified ventral hernia with obstruction, without gangrene: Secondary | ICD-10-CM

## 2011-12-05 LAB — CBC
HCT: 45.5 % (ref 39.0–52.0)
Hemoglobin: 15 g/dL (ref 13.0–17.0)
MCH: 28.2 pg (ref 26.0–34.0)
MCHC: 33 g/dL (ref 30.0–36.0)
MCV: 85.5 fL (ref 78.0–100.0)
Platelets: 298 10*3/uL (ref 150–400)
RBC: 5.32 MIL/uL (ref 4.22–5.81)
RDW: 14.4 % (ref 11.5–15.5)
WBC: 7.4 10*3/uL (ref 4.0–10.5)

## 2011-12-05 LAB — DIFFERENTIAL
Basophils Absolute: 0.1 10*3/uL (ref 0.0–0.1)
Basophils Relative: 1 % (ref 0–1)
Eosinophils Absolute: 0.3 10*3/uL (ref 0.0–0.7)
Eosinophils Relative: 5 % (ref 0–5)
Lymphocytes Relative: 37 % (ref 12–46)
Lymphs Abs: 2.8 10*3/uL (ref 0.7–4.0)
Monocytes Absolute: 0.6 10*3/uL (ref 0.1–1.0)
Monocytes Relative: 8 % (ref 3–12)
Neutro Abs: 3.6 10*3/uL (ref 1.7–7.7)
Neutrophils Relative %: 49 % (ref 43–77)

## 2011-12-05 LAB — SURGICAL PCR SCREEN
MRSA, PCR: POSITIVE — AB
Staphylococcus aureus: POSITIVE — AB

## 2011-12-05 LAB — COMPREHENSIVE METABOLIC PANEL
ALT: 18 U/L (ref 0–53)
AST: 18 U/L (ref 0–37)
Albumin: 4.2 g/dL (ref 3.5–5.2)
Alkaline Phosphatase: 73 U/L (ref 39–117)
BUN: 13 mg/dL (ref 6–23)
CO2: 28 mEq/L (ref 19–32)
Calcium: 9.9 mg/dL (ref 8.4–10.5)
Chloride: 101 mEq/L (ref 96–112)
Creatinine, Ser: 1.1 mg/dL (ref 0.50–1.35)
GFR calc Af Amer: >90 mL/min (ref >90)
GFR calc non Af Amer: 78 mL/min — ABNORMAL LOW (ref >90)
Glucose, Bld: 92 mg/dL (ref 70–99)
Potassium: 4.5 mEq/L (ref 3.5–5.1)
Sodium: 137 mEq/L (ref 135–145)
Total Bilirubin: 0.6 mg/dL (ref 0.3–1.2)
Total Protein: 8.1 g/dL (ref 6.0–8.3)

## 2011-12-05 MED ORDER — IOHEXOL 300 MG/ML  SOLN
150.0000 mL | Freq: Once | INTRAMUSCULAR | Status: AC | PRN
  Administered 2011-12-05: 150 mL via INTRAVENOUS

## 2011-12-05 NOTE — Patient Instructions (Addendum)
[name]  12/05/2011   Your procedure is scheduled on:  12-08-2011  Report to [facility] Stay [facility] at  0930 AM.  Call this number if you have problems the morning of surgery: [phone]   Remember:   Do not eat food or drink liquids:After Midnight.  .  Take these medicines the morning of surgery with A SIP OF WATER: no meds to take   Do not wear jewelry or make up.  Do not wear lotions, powders, or perfumes.Do not wear deodorant.    Do not bring valuables to the hospital.  Contacts, dentures or bridgework may not be worn into surgery.  Leave suitcase in the car. After surgery it may be brought to your room.  For patients admitted to the hospital, checkout time is 11:00 AM the day of discharge.     Special Instructions: CHG Shower Use Special Wash: 1/2 bottle night before surgery and 1/2 bottle morning of surgery, use regular soap on face and front and back private area.   Please read over the following fact sheets that you were given: MRSA Information  [facility] pre op nurse phone number [phone], call if needed

## 2011-12-05 NOTE — Progress Notes (Signed)
12/05/11 1410  OBSTRUCTIVE SLEEP APNEA  Have you ever been diagnosed with sleep apnea through a sleep study? No  Do you snore loudly (loud enough to be heard through closed doors)?  1  Do you often feel tired, fatigued, or sleepy during the daytime? 0  Has anyone observed you stop breathing during your sleep? 1  Do you have, or are you being treated for high blood pressure? 0  BMI more than 35 kg/m2? 1  Age over 47 years old? 0  Neck circumference greater than 40 cm/18 inches? 0  Gender: 1  Obstructive Sleep Apnea Score 4   Score 4 or greater  Updated health history;Results sent to PCP

## 2011-12-06 ENCOUNTER — Encounter (HOSPITAL_COMMUNITY): Payer: Self-pay

## 2011-12-06 NOTE — Progress Notes (Signed)
Slowed ekg 12-05-2011 to dr ewell, made aware pt medical hx, pt ok for surgery

## 2011-12-08 ENCOUNTER — Encounter (HOSPITAL_COMMUNITY): Payer: Self-pay | Admitting: Anesthesiology

## 2011-12-08 ENCOUNTER — Encounter (HOSPITAL_COMMUNITY): Payer: Self-pay | Admitting: *Deleted

## 2011-12-08 ENCOUNTER — Encounter (HOSPITAL_COMMUNITY)
Admission: RE | Disposition: A | Discharge status: Home / Self Care | Payer: Self-pay | Source: Ambulatory Visit | Attending: General Surgery

## 2011-12-08 ENCOUNTER — Ambulatory Visit (HOSPITAL_COMMUNITY)
Admission: RE | Admit: 2011-12-08 | Discharge: 2011-12-10 | Disposition: A | Discharge status: Home / Self Care | Payer: BC Managed Care – PPO | Source: Ambulatory Visit | Attending: General Surgery | Admitting: General Surgery

## 2011-12-08 ENCOUNTER — Ambulatory Visit (HOSPITAL_COMMUNITY): Payer: BC Managed Care – PPO | Admitting: Anesthesiology

## 2011-12-08 DIAGNOSIS — R0989 Other specified symptoms and signs involving the circulatory and respiratory systems: Secondary | ICD-10-CM | POA: Insufficient documentation

## 2011-12-08 DIAGNOSIS — M129 Arthropathy, unspecified: Secondary | ICD-10-CM | POA: Insufficient documentation

## 2011-12-08 DIAGNOSIS — E785 Hyperlipidemia, unspecified: Secondary | ICD-10-CM | POA: Insufficient documentation

## 2011-12-08 DIAGNOSIS — Z01810 Encounter for preprocedural cardiovascular examination: Secondary | ICD-10-CM | POA: Insufficient documentation

## 2011-12-08 DIAGNOSIS — Z01812 Encounter for preprocedural laboratory examination: Secondary | ICD-10-CM | POA: Insufficient documentation

## 2011-12-08 DIAGNOSIS — K436 Other and unspecified ventral hernia with obstruction, without gangrene: Secondary | ICD-10-CM

## 2011-12-08 DIAGNOSIS — R0609 Other forms of dyspnea: Secondary | ICD-10-CM | POA: Insufficient documentation

## 2011-12-08 DIAGNOSIS — J45909 Unspecified asthma, uncomplicated: Secondary | ICD-10-CM | POA: Insufficient documentation

## 2011-12-08 HISTORY — PX: VENTRAL HERNIA REPAIR: SHX424

## 2011-12-08 SURGERY — REPAIR, HERNIA, VENTRAL, LAPAROSCOPIC
Anesthesia: General | Site: Abdomen | Wound class: Clean

## 2011-12-08 MED ORDER — NEOSTIGMINE METHYLSULFATE 1 MG/ML IJ SOLN
INTRAMUSCULAR | Status: DC | PRN
  Administered 2011-12-08: 5 mg via INTRAVENOUS

## 2011-12-08 MED ORDER — OXYCODONE HCL 5 MG PO TABS
5.0000 mg | ORAL_TABLET | ORAL | Status: DC | PRN
  Administered 2011-12-09: 10 mg via ORAL
  Administered 2011-12-09: 5 mg via ORAL
  Administered 2011-12-10 (×2): 10 mg via ORAL
  Filled 2011-12-08: qty 2
  Filled 2011-12-08: qty 1
  Filled 2011-12-08 (×2): qty 2

## 2011-12-08 MED ORDER — DIPHENHYDRAMINE HCL 12.5 MG/5ML PO ELIX: 12.5000 mg | ORAL_SOLUTION | Freq: Four times a day (QID) | ORAL | Status: DC | PRN

## 2011-12-08 MED ORDER — BIOTENE DRY MOUTH MT LIQD
15.0000 mL | Freq: Two times a day (BID) | OROMUCOSAL | Status: DC
  Administered 2011-12-08 – 2011-12-10 (×4): 15 mL via OROMUCOSAL

## 2011-12-08 MED ORDER — SODIUM CHLORIDE 0.9 % IJ SOLN: 9.0000 mL | INTRAMUSCULAR | Status: DC | PRN

## 2011-12-08 MED ORDER — PROMETHAZINE HCL 25 MG/ML IJ SOLN: 6.2500 mg | INTRAMUSCULAR | Status: DC | PRN

## 2011-12-08 MED ORDER — HYDROMORPHONE HCL PF 1 MG/ML IJ SOLN
INTRAMUSCULAR | Status: AC
  Filled 2011-12-08: qty 1

## 2011-12-08 MED ORDER — ENOXAPARIN SODIUM 40 MG/0.4ML ~~LOC~~ SOLN
40.0000 mg | SUBCUTANEOUS | Status: DC
Start: 2011-12-08 — End: 2011-12-10
  Administered 2011-12-08 – 2011-12-09 (×2): 40 mg via SUBCUTANEOUS
  Filled 2011-12-08 (×3): qty 0.4

## 2011-12-08 MED ORDER — ACETAMINOPHEN 10 MG/ML IV SOLN
INTRAVENOUS | Status: AC
  Filled 2011-12-08: qty 100

## 2011-12-08 MED ORDER — MORPHINE SULFATE (PF) 1 MG/ML IV SOLN
INTRAVENOUS | Status: DC
  Administered 2011-12-08 (×2): 1 mg via INTRAVENOUS
  Administered 2011-12-09: 2 mg via INTRAVENOUS
  Administered 2011-12-09: 5 mg via INTRAVENOUS
  Administered 2011-12-09: 2 mg via INTRAVENOUS

## 2011-12-08 MED ORDER — ACETAMINOPHEN 10 MG/ML IV SOLN
INTRAVENOUS | Status: DC | PRN
  Administered 2011-12-08: 1000 mg via INTRAVENOUS

## 2011-12-08 MED ORDER — CEFAZOLIN SODIUM 1-5 GM-% IV SOLN
INTRAVENOUS | Status: AC
  Filled 2011-12-08: qty 50

## 2011-12-08 MED ORDER — LABETALOL HCL 5 MG/ML IV SOLN
INTRAVENOUS | Status: AC
  Filled 2011-12-08: qty 4

## 2011-12-08 MED ORDER — ONDANSETRON HCL 4 MG/2ML IJ SOLN: 4.0000 mg | Freq: Four times a day (QID) | INTRAMUSCULAR | Status: DC | PRN

## 2011-12-08 MED ORDER — DIPHENHYDRAMINE HCL 50 MG/ML IJ SOLN: 12.5000 mg | Freq: Four times a day (QID) | INTRAMUSCULAR | Status: DC | PRN

## 2011-12-08 MED ORDER — LABETALOL HCL 5 MG/ML IV SOLN
5.0000 mg | Freq: Once | INTRAVENOUS | Status: AC
  Administered 2011-12-08: 2.5 mg via INTRAVENOUS

## 2011-12-08 MED ORDER — DOCUSATE SODIUM 100 MG PO CAPS
100.0000 mg | ORAL_CAPSULE | Freq: Two times a day (BID) | ORAL | Status: DC
  Administered 2011-12-08 – 2011-12-10 (×4): 100 mg via ORAL
  Filled 2011-12-08 (×5): qty 1

## 2011-12-08 MED ORDER — ONDANSETRON HCL 4 MG/2ML IJ SOLN
INTRAMUSCULAR | Status: DC | PRN
  Administered 2011-12-08: 4 mg via INTRAVENOUS

## 2011-12-08 MED ORDER — MIDAZOLAM HCL 5 MG/5ML IJ SOLN
INTRAMUSCULAR | Status: DC | PRN
  Administered 2011-12-08: 2 mg via INTRAVENOUS

## 2011-12-08 MED ORDER — PROPOFOL 10 MG/ML IV EMUL
INTRAVENOUS | Status: DC | PRN
  Administered 2011-12-08: 200 mg via INTRAVENOUS

## 2011-12-08 MED ORDER — SUCCINYLCHOLINE CHLORIDE 20 MG/ML IJ SOLN
INTRAMUSCULAR | Status: DC | PRN
  Administered 2011-12-08: 130 mg via INTRAVENOUS

## 2011-12-08 MED ORDER — BUPIVACAINE-EPINEPHRINE 0.25% -1:200000 IJ SOLN
INTRAMUSCULAR | Status: DC | PRN
  Administered 2011-12-08: 50 mL

## 2011-12-08 MED ORDER — GLYCOPYRROLATE 0.2 MG/ML IJ SOLN
INTRAMUSCULAR | Status: DC | PRN
  Administered 2011-12-08: .7 mg via INTRAVENOUS

## 2011-12-08 MED ORDER — ESMOLOL HCL 10 MG/ML IV SOLN
INTRAVENOUS | Status: DC | PRN
  Administered 2011-12-08 (×3): 20 mg via INTRAVENOUS

## 2011-12-08 MED ORDER — PANTOPRAZOLE SODIUM 40 MG IV SOLR
40.0000 mg | INTRAVENOUS | Status: DC
  Administered 2011-12-08: 40 mg via INTRAVENOUS
  Filled 2011-12-08 (×2): qty 40

## 2011-12-08 MED ORDER — ROCURONIUM BROMIDE 100 MG/10ML IV SOLN
INTRAVENOUS | Status: DC | PRN
  Administered 2011-12-08: 40 mg via INTRAVENOUS

## 2011-12-08 MED ORDER — ACETAMINOPHEN 10 MG/ML IV SOLN
1000.0000 mg | Freq: Four times a day (QID) | INTRAVENOUS | Status: AC
  Administered 2011-12-08 – 2011-12-09 (×4): 1000 mg via INTRAVENOUS
  Filled 2011-12-08 (×6): qty 100

## 2011-12-08 MED ORDER — BUPIVACAINE-EPINEPHRINE 0.25% -1:200000 IJ SOLN
INTRAMUSCULAR | Status: AC
  Filled 2011-12-08: qty 1

## 2011-12-08 MED ORDER — FENTANYL CITRATE 0.05 MG/ML IJ SOLN
INTRAMUSCULAR | Status: DC | PRN
  Administered 2011-12-08 (×4): 50 ug via INTRAVENOUS

## 2011-12-08 MED ORDER — EPHEDRINE SULFATE 50 MG/ML IJ SOLN
INTRAMUSCULAR | Status: DC | PRN
  Administered 2011-12-08: 10 mg via INTRAVENOUS

## 2011-12-08 MED ORDER — HYDROMORPHONE HCL PF 1 MG/ML IJ SOLN
0.5000 mg | INTRAMUSCULAR | Status: AC
  Administered 2011-12-08 (×2): 0.5 mg via INTRAVENOUS

## 2011-12-08 MED ORDER — LACTATED RINGERS IV SOLN
INTRAVENOUS | Status: DC
  Administered 2011-12-08: 1000 mL via INTRAVENOUS

## 2011-12-08 MED ORDER — LACTATED RINGERS IV SOLN: INTRAVENOUS | Status: DC

## 2011-12-08 MED ORDER — NALOXONE HCL 0.4 MG/ML IJ SOLN: 0.4000 mg | INTRAMUSCULAR | Status: DC | PRN

## 2011-12-08 MED ORDER — HYDROMORPHONE HCL PF 1 MG/ML IJ SOLN
0.2500 mg | INTRAMUSCULAR | Status: DC | PRN
  Administered 2011-12-08 (×4): 0.5 mg via INTRAVENOUS

## 2011-12-08 MED ORDER — MORPHINE SULFATE (PF) 1 MG/ML IV SOLN
INTRAVENOUS | Status: AC
  Filled 2011-12-08: qty 25

## 2011-12-08 MED ORDER — CEFAZOLIN SODIUM-DEXTROSE 2-3 GM-% IV SOLR
INTRAVENOUS | Status: AC
  Filled 2011-12-08: qty 50

## 2011-12-08 MED ORDER — LIDOCAINE HCL 4 % MT SOLN
OROMUCOSAL | Status: DC | PRN
  Administered 2011-12-08: 4 mL via TOPICAL

## 2011-12-08 MED ORDER — VANCOMYCIN HCL 1000 MG IV SOLR
1500.0000 mg | INTRAVENOUS | Status: AC
  Administered 2011-12-08: 1500 mg via INTRAVENOUS
  Filled 2011-12-08 (×2): qty 1500

## 2011-12-08 MED ORDER — DEXTROSE 5 % IV SOLN: 3.0000 g | INTRAVENOUS | Status: DC

## 2011-12-08 MED ORDER — ONDANSETRON HCL 4 MG PO TABS: 4.0000 mg | ORAL_TABLET | Freq: Four times a day (QID) | ORAL | Status: DC | PRN

## 2011-12-08 MED ORDER — KCL IN DEXTROSE-NACL 20-5-0.45 MEQ/L-%-% IV SOLN
INTRAVENOUS | Status: DC
  Administered 2011-12-08 – 2011-12-10 (×4): via INTRAVENOUS
  Filled 2011-12-08 (×7): qty 1000

## 2011-12-08 SURGICAL SUPPLY — 54 items
APPLIER CLIP LOGIC TI 5 (MISCELLANEOUS) IMPLANT
BANDAGE ADHESIVE 1X3 (GAUZE/BANDAGES/DRESSINGS) IMPLANT
BENZOIN TINCTURE PRP APPL 2/3 (GAUZE/BANDAGES/DRESSINGS) IMPLANT
BINDER ABD UNIV 12 45-62 (WOUND CARE) ×1 IMPLANT
BINDER ABDOMINAL 46IN 62IN (WOUND CARE) ×2
CABLE HIGH FREQUENCY MONO STRZ (ELECTRODE) IMPLANT
CANISTER SUCTION 2500CC (MISCELLANEOUS) ×2 IMPLANT
CATH FOLEY LATEX FREE 16FR (CATHETERS) ×2 IMPLANT
CHLORAPREP W/TINT 26ML (MISCELLANEOUS) ×2 IMPLANT
CLOTH BEACON ORANGE TIMEOUT ST (SAFETY) ×2 IMPLANT
DECANTER SPIKE VIAL GLASS SM (MISCELLANEOUS) ×2 IMPLANT
DERMABOND ADVANCED (GAUZE/BANDAGES/DRESSINGS) ×1
DERMABOND ADVANCED .7 DNX12 (GAUZE/BANDAGES/DRESSINGS) ×1 IMPLANT
DEVICE SECURE STRAP 25 ABSORB (INSTRUMENTS) ×4 IMPLANT
DEVICE TROCAR PUNCTURE CLOSURE (ENDOMECHANICALS) ×2 IMPLANT
DRAPE INCISE IOBAN 66X45 STRL (DRAPES) ×2 IMPLANT
DRAPE LAPAROSCOPIC ABDOMINAL (DRAPES) ×2 IMPLANT
DRAPE UTILITY XL STRL (DRAPES) ×2 IMPLANT
ELECT REM PT RETURN 9FT ADLT (ELECTROSURGICAL) ×2
ELECTRODE REM PT RTRN 9FT ADLT (ELECTROSURGICAL) ×1 IMPLANT
GLOVE BIOGEL PI IND STRL 6.5 (GLOVE) ×1 IMPLANT
GLOVE BIOGEL PI IND STRL 8 (GLOVE) ×1 IMPLANT
GLOVE BIOGEL PI INDICATOR 6.5 (GLOVE) ×1
GLOVE BIOGEL PI INDICATOR 8 (GLOVE) ×1
GLOVE SURG SS PI 6.5 STRL IVOR (GLOVE) ×2 IMPLANT
GLOVE SURG SS PI 7.5 STRL IVOR (GLOVE) ×2 IMPLANT
GOWN STRL NON-REIN LRG LVL3 (GOWN DISPOSABLE) ×2 IMPLANT
GOWN STRL REIN XL XLG (GOWN DISPOSABLE) ×4 IMPLANT
KIT BASIN OR (CUSTOM PROCEDURE TRAY) ×2 IMPLANT
MESH VENTRALIGHT ST 6IN CRC (Mesh General) ×2 IMPLANT
NEEDLE INSUFFLATION 14GA 120MM (NEEDLE) IMPLANT
NEEDLE SPNL 22GX3.5 QUINCKE BK (NEEDLE) ×2 IMPLANT
NS IRRIG 1000ML POUR BTL (IV SOLUTION) ×2 IMPLANT
PEN SKIN MARKING BROAD (MISCELLANEOUS) ×2 IMPLANT
SCALPEL HARMONIC ACE (MISCELLANEOUS) ×2 IMPLANT
SCISSORS LAP 5X35 DISP (ENDOMECHANICALS) ×2 IMPLANT
SET IRRIG TUBING LAPAROSCOPIC (IRRIGATION / IRRIGATOR) IMPLANT
SOLUTION ANTI FOG 6CC (MISCELLANEOUS) ×2 IMPLANT
STAPLER VISISTAT 35W (STAPLE) IMPLANT
STRIP CLOSURE SKIN 1/2X4 (GAUZE/BANDAGES/DRESSINGS) IMPLANT
SUT MNCRL AB 4-0 PS2 18 (SUTURE) ×2 IMPLANT
SUT NOVA NAB DX-16 0-1 5-0 T12 (SUTURE) ×4 IMPLANT
SUT NOVA NAB GS-21 0 18 T12 DT (SUTURE) IMPLANT
SUT PROLENE 1 CT 1 30 (SUTURE) IMPLANT
SUT PROLENE 2 0 CT 1 CR (SUTURE) IMPLANT
SUT PROLENE 2 0 SH DA (SUTURE) IMPLANT
TACKER 5MM HERNIA 3.5CML NAB (ENDOMECHANICALS) IMPLANT
TOWEL OR 17X26 10 PK STRL BLUE (TOWEL DISPOSABLE) ×2 IMPLANT
TRAY LAP CHOLE (CUSTOM PROCEDURE TRAY) ×2 IMPLANT
TROCAR BLADELESS OPT 5 100 (ENDOMECHANICALS) ×2 IMPLANT
TROCAR BLADELESS OPT 5 75 (ENDOMECHANICALS) IMPLANT
TROCAR XCEL BLUNT TIP 100MML (ENDOMECHANICALS) IMPLANT
TROCAR XCEL NON-BLD 11X100MML (ENDOMECHANICALS) ×2 IMPLANT
TUBING INSUFFLATION 10FT LAP (TUBING) ×2 IMPLANT

## 2011-12-08 NOTE — Transfer of Care (Signed)
Immediate Anesthesia Transfer of Care Note  Patient: Steve Ashley  Procedure(s) Performed: Procedure(s) (LRB): LAPAROSCOPIC VENTRAL HERNIA (N/A) INSERTION OF MESH (N/A)  Patient Location: PACU  Anesthesia Type: General  Level of Consciousness: sedated, patient cooperative and responds to stimulaton  Airway & Oxygen Therapy: Patient Spontanous Breathing and Patient connected to face mask oxgen  Post-op Assessment: Report given to PACU RN and Post -op Vital signs reviewed and stable  Post vital signs: Reviewed and stable  Complications: No apparent anesthesia complications

## 2011-12-08 NOTE — Anesthesia Preprocedure Evaluation (Signed)
Anesthesia Evaluation  Patient identified by MRN, date of birth, ID band Patient awake    Reviewed: Allergy & Precautions, H&P , NPO status , Patient's Chart, lab work & pertinent test results  Airway Mallampati: III TM Distance: >3 FB Neck ROM: Full    Dental  (+) Teeth Intact and Chipped,    Pulmonary asthma ,    Pulmonary exam normal       Cardiovascular negative cardio ROS  Rate:Normal     Neuro/Psych negative neurological ROS  negative psych ROS   GI/Hepatic negative GI ROS, Neg liver ROS,   Endo/Other  Hypothyroidism Morbid obesity  Renal/GU negative Renal ROS  negative genitourinary   Musculoskeletal negative musculoskeletal ROS (+)   Abdominal (+) + obese,   Peds negative pediatric ROS (+)  Hematology negative hematology ROS (+)   Anesthesia Other Findings   Reproductive/Obstetrics negative OB ROS                           Anesthesia Physical Anesthesia Plan  ASA: III  Anesthesia Plan: General   Post-op Pain Management:    Induction: Intravenous  Airway Management Planned: Oral ETT  Additional Equipment:   Intra-op Plan:   Post-operative Plan: Extubation in OR  Informed Consent: I have reviewed the patients History and Physical, chart, labs and discussed the procedure including the risks, benefits and alternatives for the proposed anesthesia with the patient or authorized representative who has indicated his/her understanding and acceptance.   Dental advisory given  Plan Discussed with: CRNA  Anesthesia Plan Comments:         Anesthesia Quick Evaluation

## 2011-12-08 NOTE — Op Note (Signed)
Laparoscopic Ventral Hernia Repair with Mesh Procedure Note  Indications: Symptomatic ventral hernia  Pre-operative Diagnosis:  Incarcerated ventral hernia  Post-operative Diagnosis: same  Surgeon: Atilano Ina   Assistants: none  Anesthesia: General endotracheal anesthesia and Local anesthesia 0.25.% bupivacaine, with epinephrine  ASA Class: 3  Procedure Details  The patient was seen in the Holding Room. The risks, benefits, complications, treatment options, and expected outcomes were discussed with the patient. The possibilities of reaction to medication, pulmonary aspiration, perforation of viscus, bleeding, recurrent infection, the need for additional procedures, failure to diagnose a condition, and creating a complication requiring transfusion or operation were discussed with the patient. The patient concurred with the proposed plan, giving informed consent.  The site of surgery properly noted/marked. The patient was taken to the operating room, identified as Steve Ashley and the procedure verified as laparoscopic ventral hernia repair with mesh. A Time Out was held and the above information confirmed.    The patient was placed supine.  After establishing general anesthesia, a Foley catheter was placed.  The abdomen was prepped with Chloraprep and draped in standard fashion.  A 5 mm Optiview was used the cannulate the peritoneal cavity in the left upper quadrant below the costal margin.  Pneumoperitoneum was obtained by insufflating CO2, maintaining a maximum pressure of 15 mmHg.  The 5 mm 30-degree laparoscopic was inserted.  There was an omental plug to the anterior abdominal wall in the hernia defect.  An 11-mm port was placed in the left anterior axillary line at the level of the umbilicus.  I was able to reduce the plug of omentum from the hernia defect. The defect was about a 1.5-2 inches above the umbilicus. Harmonic scalpel and gentle traction were used to take down some  surrounding fat and the falciform ligament from the anterior abdominal wall.  We cleared the entire abdominal wall and were able to visualize 1 fascial defects. However the umbilicus did look a little thin. The actual defect of the hernia was about 2cm x 2cm. I used a spinal needle to identify the extent of the hernia defect.  This covered an area of about 4 x5 cm once I incorporated the umbilical area into my measurements. I selected a 15cm round piece of Bard Ventralight ST mesh.  This would give around a 6 cm overlap. We placed 8 stay sutures of 1-0 Novofil around the edges of the mesh.  The mesh was then rolled up and inserted through the 11 mm port site.  The mesh was then unrolled.  The stay sutures were then pulled up through small stab incisions using the Endo-close device.  This deployed the mesh widely over the fascial defect.  The stay sutures were then tied down.  The Secure Strap device was then used to tack down the edges of the mesh at 1 cm intervals circumferentially.  We placed a few tacks inside the outer ring of tacks.  We inspected for hemostasis.  Local was infiltrated around the transfascial suture sites.  Pneumoperitoneum was then released as we removed the remainder of the trocars.  The port sites were closed with 4-0 Monocryl.  All of the incisions and stay suture sites were then sealed with Dermabond.  An abdominal binder was placed around the patient's abdomen.  The patient was extubated and brought to the recovery room in stable condition.  All sponge, instrument, and needle counts were correct prior to closure and at the conclusion of the case.   Findings: Small  ventral hernia superior to umbilicus, reduced plug of omentum. Used a round 15cm piece of Bard Ventralight ST mesh  Estimated Blood Loss:  Minimal         Complications:  None; patient tolerated the procedure well.         Disposition: PACU - hemodynamically stable.         Condition: stable  Mary Sella. Andrey Campanile, MD,  FACS General, Bariatric, & Minimally Invasive Surgery [facility], [place]

## 2011-12-08 NOTE — Anesthesia Postprocedure Evaluation (Signed)
  Anesthesia Post-op Note  Patient: Steve Ashley  Procedure(s) Performed: Procedure(s) (LRB): LAPAROSCOPIC VENTRAL HERNIA (N/A) INSERTION OF MESH (N/A)  Patient Location: PACU  Anesthesia Type: General  Level of Consciousness: oriented and sedated  Airway and Oxygen Therapy: Patient Spontanous Breathing and Patient connected to nasal cannula oxygen  Post-op Pain: mild  Post-op Assessment: Post-op Vital signs reviewed, Patient's Cardiovascular Status Stable, Respiratory Function Stable and Patent Airway  Post-op Vital Signs: stable  Complications: No apparent anesthesia complications

## 2011-12-08 NOTE — Interval H&P Note (Signed)
History and Physical Interval Note:  12/08/2011 11:52 AM  Md B Flegel  has presented today for surgery, with the diagnosis of ventral hernia  The various methods of treatment have been discussed with the patient and family. After consideration of risks, benefits and other options for treatment, the patient has consented to  Procedure(s) (LRB): LAPAROSCOPIC VENTRAL HERNIA (N/A) INSERTION OF MESH (N/A) as a surgical intervention .  The patient's history has been reviewed, patient examined, no change in status, stable for surgery.  I have reviewed the patients' chart and labs.  Questions were answered to the patient's satisfaction.     Mary Sella. Andrey Campanile, MD, FACS General, Bariatric, & Minimally Invasive Surgery [facility], [place]  [facility] [place] M

## 2011-12-08 NOTE — H&P (View-Only) (Signed)
Patient ID: Steve Ashley, male   DOB: 12/02/1965, 46 y.o.   MRN: [id number]  Chief Complaint  Patient presents with  . Routine Post Op    reck umb hernia, poss discuss sx LOV 02/18/11    HPI Steve Ashley is a 46 y.o. male.   HPI This is a 46-year-old African American male who comes in to re-discuss ventral hernia surgery.I initially met him in September 2012. At that time, he had a partially incarcerated ventral hernia. Even though he was morbidly obese, I recommended proceeding to the operating room at that time because of my concern for possible strangulation of this hernia. The patient was going to think about it and call our office back. However he declined surgical intervention at that time. He states over the past several months he's had intermittent episodes of abdominal pain around the umbilicus. However this past Monday he developed a severe episode of pain that was extremely sharp and intense. It was the worst pain he ever had around his umbilicus. He states that it was a hard bulge at his umbilicus. He did become nauseous; however, he did not vomit. He had to leave work and go to the emergency room. He states that he went to [place] [facility]. He states that after lying supine for several hours the hernia eventually reduced itself and he was discharged home. He denies any melena or hematochezia. He denies any fever or chills. Unfortunately HE HAS GAINED some weight since his last visit.  Past Medical History  Diagnosis Date  . Syndesmotic ankle sprain   . Ankle fracture   . Morbid obesity   . Arthritis   . Asthma   . Hyperlipidemia     Past Surgical History  Procedure Date  . Ankle surgery   . Knee arthroscopy     right  . Thumb arthroscopy     Family History  Problem Relation Age of Onset  . Hypertension Mother   . Heart disease Neg Hx   . Cancer Father     prostate    Social History History  Substance Use Topics  . Smoking status: Never  Smoker   . Smokeless tobacco: Never Used  . Alcohol Use: No    No Known Allergies  No current outpatient prescriptions on file.    Review of Systems Review of Systems  Constitutional: Negative for fever, chills, appetite change and unexpected weight change.  HENT: Negative for congestion and trouble swallowing.   Eyes: Negative for visual disturbance.  Respiratory: Negative for chest tightness and shortness of breath.        +snoring; denies DOE, PND, SOB  Cardiovascular: Negative for chest pain and leg swelling.       No PND, no orthopnea, no DOE  Gastrointestinal:       See HPI  Genitourinary: Negative for dysuria and hematuria.  Musculoskeletal: Negative.   Skin: Negative for rash.  Neurological: Negative for seizures and speech difficulty.  Hematological: Does not bruise/bleed easily.  Psychiatric/Behavioral: Negative for behavioral problems and confusion.    Blood pressure 148/100, pulse 98, temperature 97.6 F (36.4 C), temperature source Temporal, resp. rate 16, height 5' 11.5" (1.816 m), weight 340 lb 6.4 oz (154.404 kg).  Physical Exam Physical Exam  Vitals reviewed. Constitutional: He is oriented to person, place, and time. He appears well-developed and well-nourished. No distress.  HENT:  Head: Normocephalic and atraumatic.  Right Ear: External ear normal.  Left Ear: External ear normal.  Eyes: Conjunctivae   are normal. No scleral icterus.  Neck: Normal range of motion. Neck supple. No tracheal deviation present. No thyromegaly present.  Cardiovascular: Normal rate, regular rhythm and normal heart sounds.   Pulmonary/Chest: Effort normal and breath sounds normal. No stridor. No respiratory distress. He has no wheezes.  Abdominal: Soft. Bowel sounds are normal. He exhibits no distension. There is no rebound and no guarding. A hernia is present. Hernia confirmed positive in the ventral area.         Ventral hernia; partially reducible. Soft, no RT, no guarding,  very mild tenderness. No skin changes  Musculoskeletal: Normal range of motion. He exhibits no edema and no tenderness.  Neurological: He is alert and oriented to person, place, and time.  Skin: Skin is warm and dry. No rash noted. He is not diaphoretic. No erythema.  Psychiatric: He has a normal mood and affect. His behavior is normal. Judgment and thought content normal.    Data Reviewed My office note from 01/2011  Assessment    Partially Incarcerated ventral hernia Morbid obesity    Plan    We re-discussed the etiology of ventral hernias. We discussed the signs and symptoms of incarceration and strangulation. The patient was given educational material. I also drew diagrams.  We discussed nonoperative and operative management. With respect to operative management, we discussed both open repair and laparoscopic repair. We discussed the pros and cons of each approach. I discussed the typical aftercare with each procedure and how each procedure differs.  The patient has elected to proceed with LAPAROSCOPIC VENTRAL HERNIA REPAIR WITH MESH  We discussed the risk and benefits of surgery including but not limited to bleeding, infection, injury to surrounding structures, hernia recurrence, mesh complications, hematoma/seroma formation, need to convert to an open procedure, blood clot formation, urinary retention, post operative ileus, general anesthesia risk, long-term abdominal pain. We discussed that this procedure can be quite uncomfortable and difficult to recover from based on how the mesh is secured to the abdominal wall. We discussed the importance of avoiding heavy lifting and straining for a period of 6 weeks.  I explained that the he is at higher risk for recurrence because of his morbid obesity. Unfortunately rather than losing weight since the last time I saw him, he has actually gained 13 pounds. His BMI is 47. Ideally, I would like for him to at least 50 pounds or more prior to  hernia repair. But I'm concerned that he is at a high risk for strangulation. He appears to have had an episode of incarceration this past Monday and it appears it has had ongoing intermittent pain since I last saw him. Therefore, I believe the safest course of action is to proceed to the OR even though he is at an increased risk for recurrence given his size. He is not interested in weight loss surgery. The patient is also having a difficult time accepting the postoperative restrictions (weight limit). I explained that it is very important to limit his activities to allow it to heal.   Marieelena Bartko M. Sherrill Mckamie, MD, FACS General, Bariatric, & Minimally Invasive Surgery [facility] [place]         Demika Langenderfer M 11/28/2011, 1:37 PM    

## 2011-12-09 ENCOUNTER — Encounter (HOSPITAL_COMMUNITY): Payer: Self-pay | Admitting: General Surgery

## 2011-12-09 LAB — BASIC METABOLIC PANEL
BUN: 10 mg/dL (ref 6–23)
CO2: 24 mEq/L (ref 19–32)
Calcium: 9.1 mg/dL (ref 8.4–10.5)
Chloride: 101 mEq/L (ref 96–112)
Creatinine, Ser: 0.95 mg/dL (ref 0.50–1.35)
GFR calc Af Amer: >90 mL/min (ref >90)
GFR calc non Af Amer: >90 mL/min (ref >90)
Glucose, Bld: 152 mg/dL — ABNORMAL HIGH (ref 70–99)
Potassium: 4.4 mEq/L (ref 3.5–5.1)
Sodium: 134 mEq/L — ABNORMAL LOW (ref 135–145)

## 2011-12-09 LAB — MAGNESIUM: Magnesium: 2.1 mg/dL (ref 1.5–2.5)

## 2011-12-09 MED ORDER — OXYCODONE-ACETAMINOPHEN 5-325 MG PO TABS: 1.0000 | ORAL_TABLET | ORAL | Status: AC | PRN

## 2011-12-09 MED ORDER — MORPHINE SULFATE 2 MG/ML IJ SOLN: 1.0000 mg | INTRAMUSCULAR | Status: DC | PRN

## 2011-12-09 MED ORDER — MUPIROCIN 2 % EX OINT
TOPICAL_OINTMENT | Freq: Two times a day (BID) | CUTANEOUS | Status: DC
  Administered 2011-12-09 (×2): via NASAL
  Administered 2011-12-10: 1 via NASAL
  Filled 2011-12-09: qty 22

## 2011-12-09 MED ORDER — METOPROLOL TARTRATE 50 MG PO TABS
50.0000 mg | ORAL_TABLET | Freq: Two times a day (BID) | ORAL | Status: DC
Start: 2011-12-09 — End: 2011-12-10
  Administered 2011-12-09 – 2011-12-10 (×2): 50 mg via ORAL
  Filled 2011-12-09 (×3): qty 1

## 2011-12-09 NOTE — Progress Notes (Signed)
1 Day Post-Op  Subjective: Very sore. Tolerated liquids. No n/v. Hasn't ambulated that much. Only used PCA so far  Objective: Vital signs in last 24 hours: Temp:  [97.5 F (36.4 C)-98.6 F (37 C)] 98.1 F (36.7 C) (07/12 0613) Pulse Rate:  [58-89] 66  (07/12 0639) Resp:  [9-34] 18  (07/12 0613) BP: (106-178)/(78-107) 158/90 mmHg (07/12 0639) SpO2:  [91 %-100 %] 100 % (07/12 0613) FiO2 (%):  [33 %-42 %] 33 % (07/12 0430) Weight:  [334 lb (151.501 kg)] 334 lb (151.501 kg) (07/11 1554)    Intake/Output from previous day: 07/11 0701 - 07/12 0700 In: 3102.1 [I.V.:3102.1] Out: 1750 [Urine:1750] Intake/Output this shift:    Alert, nad cta Reg Obese, soft, hypoBS, incision c/d/i. +binder +scds  Lab Results:  No results found for this basename: WBC:2,HGB:2,HCT:2,PLT:2 in the last 72 hours BMET  [facility] 12/09/11 0343  NA 134*  K 4.4  CL 101  CO2 24  GLUCOSE 152*  BUN 10  CREATININE 0.95  CALCIUM 9.1   PT/INR No results found for this basename: LABPROT:2,INR:2 in the last 72 hours ABG No results found for this basename: PHART:2,PCO2:2,PO2:2,HCO3:2 in the last 72 hours  Studies/Results: No results found.  Anti-infectives: Anti-infectives     Start     Dose/Rate Route Frequency Ordered Stop   12/08/11 1230   vancomycin (VANCOCIN) 1,500 mg in sodium chloride 0.9 % 500 mL IVPB        1,500 mg 250 mL/hr over 120 Minutes Intravenous On call 12/08/11 1149 12/08/11 1200   12/08/11 0950   ceFAZolin (ANCEF) 3 g in dextrose 5 % 50 mL IVPB  Status:  Discontinued        3 g 160 mL/hr over 30 Minutes Intravenous 60 min pre-op 12/08/11 0950 12/08/11 1149          Assessment/Plan: s/p Procedure(s) (LRB): LAPAROSCOPIC VENTRAL HERNIA (N/A) INSERTION OF MESH (N/A)  Adv diet Decrease IVF D/c PCA Encourage po pain meds VTE prophylaxis Possible d/c home later today - may need another day  Giulian Goldring M. Andrey Campanile, MD, FACS General, Bariatric, & Minimally Invasive  Surgery [facility], [place]   LOS: 1 day    Atilano Ina 12/09/2011

## 2011-12-09 NOTE — Progress Notes (Signed)
B/p 191/99 automatic, 190/100 manually  Dr Johna Sheriff called and orders given

## 2011-12-10 MED ORDER — OXYCODONE HCL 5 MG PO TABS: 5.0000 mg | ORAL_TABLET | ORAL | Status: AC | PRN

## 2011-12-10 NOTE — Progress Notes (Signed)
Assessment unchanged. Pt verbalized understanding of dc instructions. Script x1 given as provided by MD. Discharged via foot per pt request to front entrance to meet awaiting vehicle to carry home. Accompanied by nurse tech.

## 2011-12-10 NOTE — Progress Notes (Signed)
Patient ID: Steve Ashley, male   DOB: 1966-04-06, 46 y.o.   MRN: [id number] 2 Days Post-Op  Subjective: Very sore. No severe pain. No nausea. Drinking liquids but doesn't want to eat because he doesn't want to have a bowel movement. Wants to go home.  Objective: Vital signs in last 24 hours: Temp:  [97.7 F (36.5 C)-98.5 F (36.9 C)] 98.5 F (36.9 C) (07/13 0630) Pulse Rate:  [65-75] 65  (07/13 0630) Resp:  [18] 18  (07/13 0630) BP: (120-191)/(65-100) 131/85 mmHg (07/13 0630) SpO2:  [97 %-98 %] 98 % (07/13 0630)    Intake/Output from previous day: 07/12 0701 - 07/13 0700 In: 3360 [P.O.:360; I.V.:3000] Out: 4000 [Urine:4000] Intake/Output this shift:    General appearance: alert, no distress and morbidly obese GI: abnormal findings:  mild tenderness in the entire abdomen Incision/Wound: mild swelling at surgical site. Incisions clean and dry.  Lab Results:  No results found for this basename: WBC:2,HGB:2,HCT:2,PLT:2 in the last 72 hours BMET  [facility] 12/09/11 0343  NA 134*  K 4.4  CL 101  CO2 24  GLUCOSE 152*  BUN 10  CREATININE 0.95  CALCIUM 9.1     Studies/Results: No results found.  Anti-infectives: Anti-infectives     Start     Dose/Rate Route Frequency Ordered Stop   12/08/11 1230   vancomycin (VANCOCIN) 1,500 mg in sodium chloride 0.9 % 500 mL IVPB        1,500 mg 250 mL/hr over 120 Minutes Intravenous On call 12/08/11 1149 12/08/11 1200   12/08/11 0950   ceFAZolin (ANCEF) 3 g in dextrose 5 % 50 mL IVPB  Status:  Discontinued        3 g 160 mL/hr over 30 Minutes Intravenous 60 min pre-op 12/08/11 0950 12/08/11 1149          Assessment/Plan: s/p Procedure(s): LAPAROSCOPIC VENTRAL HERNIA INSERTION OF MESH Progressing okay without complication identified. He wants to go home and I think is okay for discharge today.   LOS: 2 days    Cinque Begley T 12/10/2011  fg

## 2012-01-04 ENCOUNTER — Encounter (INDEPENDENT_AMBULATORY_CARE_PROVIDER_SITE_OTHER): Payer: Self-pay | Admitting: General Surgery

## 2012-01-04 ENCOUNTER — Ambulatory Visit (INDEPENDENT_AMBULATORY_CARE_PROVIDER_SITE_OTHER): Payer: BC Managed Care – PPO | Admitting: General Surgery

## 2012-01-04 VITALS — BP 146/82 | HR 88 | Temp 97.1°F | Resp 16 | Ht 73.0 in | Wt 330.4 lb

## 2012-01-04 DIAGNOSIS — Z09 Encounter for follow-up examination after completed treatment for conditions other than malignant neoplasm: Secondary | ICD-10-CM

## 2012-01-04 NOTE — Progress Notes (Signed)
Subjective:     Patient ID: Steve Ashley, male   DOB: 1965/09/28, 46 y.o.   MRN: [id number]  HPI 46 year old morbidly obese African American male comes in for followup after undergoing laparoscopic repair of incarcerated ventral hernia on July 11. He was discharged from the hospital 2 days later. He states that he is doing quite well. He denies any fever, chills, nausea, vomiting, diarrhea or constipation. He is no longer taking pain medication. He has one suture site that causes him some intermittent discomfort with certain activities. However, it is getting better. He has been doing some light exercising such as walking  Review of Systems     Objective:   Physical Exam BP 146/82  Pulse 88  Temp 97.1 F (36.2 C) (Temporal)  Resp 16  Ht 6\' 1"  (1.854 m)  Wt 330 lb 6.4 oz (149.868 kg)  BMI 43.59 kg/m2  Gen: alert, NAD, non-toxic appearing Pupils: equal, no scleral icterus Pulm: Lungs clear to auscultation, symmetric chest rise CV: regular rate and rhythm Abd: soft, nontender, nondistended, obese. Well-healed trocar sites. No cellulitis. No incisional hernia. Small 3-4 seroma in upper midine.  Ext: no edema, no calf tenderness Skin: no rash, no jaundice     Assessment:     S/p Laparoscopic repair of incarcerated ventral hernia with mesh 7/11    Plan:     The patient is going great. He has lost 10 pounds since surgery. I congratulated him on his weight loss. I reminded him he should not do any heavy lifting until the week of Aug 26th. I encouraged him to keep up with his light exercise. F/u 8 weeks.   Mary Sella. Andrey Campanile, MD, FACS General, Bariatric, & Minimally Invasive Surgery [facility], [place]

## 2012-01-04 NOTE — Patient Instructions (Signed)
You can do LIGHT cardio activity and some light weight leg flex/ext as well as tricep/bicep exercises but NO squat, crunches, pull-up, push-ups. At the 6 week mark (week of Aug 26), gradually ease back into full activities

## 2012-02-29 ENCOUNTER — Encounter (INDEPENDENT_AMBULATORY_CARE_PROVIDER_SITE_OTHER): Payer: BC Managed Care – PPO | Admitting: General Surgery

## 2012-03-05 ENCOUNTER — Encounter (INDEPENDENT_AMBULATORY_CARE_PROVIDER_SITE_OTHER): Payer: Self-pay | Admitting: General Surgery

## 2012-07-25 ENCOUNTER — Other Ambulatory Visit: Payer: Self-pay | Admitting: Family Medicine

## 2012-07-25 DIAGNOSIS — E039 Hypothyroidism, unspecified: Secondary | ICD-10-CM

## 2012-07-25 DIAGNOSIS — E785 Hyperlipidemia, unspecified: Secondary | ICD-10-CM

## 2012-07-25 DIAGNOSIS — R5381 Other malaise: Secondary | ICD-10-CM

## 2012-07-25 DIAGNOSIS — Z125 Encounter for screening for malignant neoplasm of prostate: Secondary | ICD-10-CM

## 2012-08-01 ENCOUNTER — Other Ambulatory Visit (INDEPENDENT_AMBULATORY_CARE_PROVIDER_SITE_OTHER): Payer: BC Managed Care – PPO

## 2012-08-01 DIAGNOSIS — Z125 Encounter for screening for malignant neoplasm of prostate: Secondary | ICD-10-CM

## 2012-08-01 DIAGNOSIS — R5381 Other malaise: Secondary | ICD-10-CM

## 2012-08-01 DIAGNOSIS — E785 Hyperlipidemia, unspecified: Secondary | ICD-10-CM

## 2012-08-01 DIAGNOSIS — E039 Hypothyroidism, unspecified: Secondary | ICD-10-CM

## 2012-08-01 LAB — CBC WITH DIFFERENTIAL/PLATELET
Basophils Absolute: 0.1 10*3/uL (ref 0.0–0.1)
Basophils Relative: 0.6 % (ref 0.0–3.0)
Eosinophils Absolute: 0.4 10*3/uL (ref 0.0–0.7)
Eosinophils Relative: 4.5 % (ref 0.0–5.0)
HCT: 44.2 % (ref 39.0–52.0)
Hemoglobin: 14.5 g/dL (ref 13.0–17.0)
Lymphocytes Relative: 35.1 % (ref 12.0–46.0)
Lymphs Abs: 3 10*3/uL (ref 0.7–4.0)
MCHC: 32.8 g/dL (ref 30.0–36.0)
MCV: 84.9 fl (ref 78.0–100.0)
Monocytes Absolute: 0.8 10*3/uL (ref 0.1–1.0)
Monocytes Relative: 9.3 % (ref 3.0–12.0)
Neutro Abs: 4.4 10*3/uL (ref 1.4–7.7)
Neutrophils Relative %: 50.5 % (ref 43.0–77.0)
Platelets: 289 10*3/uL (ref 150.0–400.0)
RBC: 5.2 Mil/uL (ref 4.22–5.81)
RDW: 14.8 % — ABNORMAL HIGH (ref 11.5–14.6)
WBC: 8.7 10*3/uL (ref 4.5–10.5)

## 2012-08-01 LAB — LIPID PANEL
Cholesterol: 214 mg/dL — ABNORMAL HIGH (ref 0–200)
HDL: 38.6 mg/dL — ABNORMAL LOW (ref >39.00)
Total CHOL/HDL Ratio: 6
Triglycerides: 82 mg/dL (ref 0.0–149.0)
VLDL: 16.4 mg/dL (ref 0.0–40.0)

## 2012-08-01 LAB — BASIC METABOLIC PANEL
BUN: 13 mg/dL (ref 6–23)
CO2: 31 mEq/L (ref 19–32)
Calcium: 9.8 mg/dL (ref 8.4–10.5)
Chloride: 104 mEq/L (ref 96–112)
Creatinine, Ser: 1.1 mg/dL (ref 0.4–1.5)
GFR: 97.02 mL/min (ref >60.00)
Glucose, Bld: 87 mg/dL (ref 70–99)
Potassium: 5 mEq/L (ref 3.5–5.1)
Sodium: 142 mEq/L (ref 135–145)

## 2012-08-01 LAB — HEPATIC FUNCTION PANEL
ALT: 23 U/L (ref 0–53)
AST: 18 U/L (ref 0–37)
Albumin: 4 g/dL (ref 3.5–5.2)
Alkaline Phosphatase: 61 U/L (ref 39–117)
Bilirubin, Direct: 0.1 mg/dL (ref 0.0–0.3)
Total Bilirubin: 0.7 mg/dL (ref 0.3–1.2)
Total Protein: 7.5 g/dL (ref 6.0–8.3)

## 2012-08-01 LAB — LDL CHOLESTEROL, DIRECT: Direct LDL: 170.9 mg/dL

## 2012-08-01 LAB — TSH: TSH: 8.54 u[IU]/mL — ABNORMAL HIGH (ref 0.35–5.50)

## 2012-08-01 LAB — PSA: PSA: 0.46 ng/mL (ref 0.10–4.00)

## 2012-08-08 ENCOUNTER — Ambulatory Visit (INDEPENDENT_AMBULATORY_CARE_PROVIDER_SITE_OTHER): Payer: BC Managed Care – PPO | Admitting: Family Medicine

## 2012-08-08 ENCOUNTER — Encounter: Payer: Self-pay | Admitting: Family Medicine

## 2012-08-08 VITALS — BP 130/80 | HR 77 | Temp 98.4°F | Ht 73.0 in | Wt 339.0 lb

## 2012-08-08 DIAGNOSIS — G4733 Obstructive sleep apnea (adult) (pediatric): Secondary | ICD-10-CM

## 2012-08-08 DIAGNOSIS — E039 Hypothyroidism, unspecified: Secondary | ICD-10-CM

## 2012-08-08 DIAGNOSIS — Z6841 Body Mass Index (BMI) 40.0 and over, adult: Secondary | ICD-10-CM

## 2012-08-08 DIAGNOSIS — Z0000 Encounter for general adult medical examination without abnormal findings: Secondary | ICD-10-CM

## 2012-08-08 MED ORDER — LEVOTHYROXINE SODIUM 50 MCG PO TABS
50.0000 ug | ORAL_TABLET | Freq: Every day | ORAL | Status: DC
Start: 2012-08-08 — End: 2013-10-10

## 2012-08-08 NOTE — Patient Instructions (Addendum)
REFERRAL: GO THE THE FRONT ROOM AT THE ENTRANCE OF OUR CLINIC, NEAR CHECK IN. ASK FOR MARION. SHE WILL HELP YOU SET UP YOUR REFERRAL. DATE: TIME:   F/u for thyroid labs in 1 month

## 2012-08-08 NOTE — Progress Notes (Signed)
Nature conservation officer at [facility] [address] [place] [address] Phone: [phone] Fax: [phone]  Date:  08/08/2012   Name:  Steve Ashley   DOB:  12/30/1965   MRN:  [id number] Gender: male Age: 47 y.o.  Primary Physician:  Hannah Beat, MD  Evaluating MD: Hannah Beat, MD   Chief Complaint: Annual Exam   History of Present Illness:  Steve Ashley is a 47 y.o. pleasant patient who presents with the following:  CPX and management of ongoing chronic medical problems:  Thyroid: Sleep study: everyone in family has sleep apnea Lipids:  Thyroid: Some weight gain. Labs reviewed. Denies cold / heat intolerance, dry skin, hair loss. No goiter.  Lab Results  Component Value Date   TSH 8.54* 08/01/2012    OSA: morbidly obese, 339 pounds, snores all the time, tired all the time. Ex-wife told him that he choked and stopped breathing while sleeping, but he has never had a sleep study.  Knees, hips: hurt much of the time.   Has been having a lot of DJ gigs.   Preventative Health Maintenance Visit:  Health Maintenance Summary Reviewed and updated, unless pt declines services.  Tobacco History Reviewed. Alcohol: No concerns, no excessive use Exercise Habits: rare activity, but likes to swim for exercise STD concerns: no risk or activity to increase risk Drug Use: None Encouraged self-testicular check  Labs reviewed with the patient.  Results for orders placed in visit on 08/01/12  LIPID PANEL      Result Value Range   Cholesterol 214 (*) 0 - 200 mg/dL   Triglycerides 86.5  0.0 - 149.0 mg/dL   HDL 78.46 (*) >96.29 mg/dL   VLDL 52.8  0.0 - 41.3 mg/dL   Total CHOL/HDL Ratio 6    HEPATIC FUNCTION PANEL      Result Value Range   Total Bilirubin 0.7  0.3 - 1.2 mg/dL   Bilirubin, Direct 0.1  0.0 - 0.3 mg/dL   Alkaline Phosphatase 61  39 - 117 U/L   AST 18  0 - 37 U/L   ALT 23  0 - 53 U/L   Total Protein 7.5  6.0 - 8.3 g/dL   Albumin 4.0  3.5 -  5.2 g/dL  CBC WITH DIFFERENTIAL      Result Value Range   WBC 8.7  4.5 - 10.5 K/uL   RBC 5.20  4.22 - 5.81 Mil/uL   Hemoglobin 14.5  13.0 - 17.0 g/dL   HCT 24.4  01.0 - 27.2 %   MCV 84.9  78.0 - 100.0 fl   MCHC 32.8  30.0 - 36.0 g/dL   RDW 53.6 (*) 64.4 - 03.4 %   Platelets 289.0  150.0 - 400.0 K/uL   Neutrophils Relative 50.5  43.0 - 77.0 %   Lymphocytes Relative 35.1  12.0 - 46.0 %   Monocytes Relative 9.3  3.0 - 12.0 %   Eosinophils Relative 4.5  0.0 - 5.0 %   Basophils Relative 0.6  0.0 - 3.0 %   Neutro Abs 4.4  1.4 - 7.7 K/uL   Lymphs Abs 3.0  0.7 - 4.0 K/uL   Monocytes Absolute 0.8  0.1 - 1.0 K/uL   Eosinophils Absolute 0.4  0.0 - 0.7 K/uL   Basophils Absolute 0.1  0.0 - 0.1 K/uL  BASIC METABOLIC PANEL      Result Value Range   Sodium 142  135 - 145 mEq/L   Potassium 5.0  3.5 - 5.1  mEq/L   Chloride 104  96 - 112 mEq/L   CO2 31  19 - 32 mEq/L   Glucose, Bld 87  70 - 99 mg/dL   BUN 13  6 - 23 mg/dL   Creatinine, Ser 1.1  0.4 - 1.5 mg/dL   Calcium 9.8  8.4 - 16.1 mg/dL   GFR 09.60  >45.40 mL/min  PSA      Result Value Range   PSA 0.46  0.10 - 4.00 ng/mL  TSH      Result Value Range   TSH 8.54 (*) 0.35 - 5.50 uIU/mL  LDL CHOLESTEROL, DIRECT      Result Value Range   Direct LDL 170.9       Patient Active Problem List  Diagnosis  . HYPOTHYROIDISM  . HYPERLIPIDEMIA  . HALLUX RIGIDUS    Past Medical History  Diagnosis Date  . Syndesmotic ankle sprain yrs ago  . Ankle fracture yrs ago    left   . Morbid obesity   . Arthritis   . Hyperlipidemia   . Asthma      hx of childhood asthma, none current    Past Surgical History  Procedure Laterality Date  . Ankle surgery  age 56    left ankle   . Knee arthroscopy  yrs ago    right  . Thumb arthroscopy  age 89    left   . Ventral hernia repair  12/08/2011    Procedure: LAPAROSCOPIC VENTRAL HERNIA;  Surgeon: Atilano Ina, MD,FACS;  Location: [facility];  Service: General;  Laterality: N/A;    History    Social History  . Marital Status: Legally Separated    Spouse Name: N/A    Number of Children: N/A  . Years of Education: N/A   Occupational History  . PE teacher    Social History Main Topics  . Smoking status: Never Smoker   . Smokeless tobacco: Never Used  . Alcohol Use: No  . Drug Use: No  . Sexually Active: Not on file   Other Topics Concern  . Not on file   Social History Narrative  . No narrative on file    Family History  Problem Relation Age of Onset  . Hypertension Mother   . Heart disease Neg Hx   . Cancer Father     prostate    Allergies  Allergen Reactions  . Latex Itching    Medication list has been reviewed and updated.  Outpatient Prescriptions Prior to Visit  Medication Sig Dispense Refill  . RASPBERRY KETONES PO Take 2 tablets by mouth daily.       No facility-administered medications prior to visit.    Review of Systems:   General: Denies fever, chills, sweats. No significant weight loss. WEIGHT GAIN, FATIGUE Eyes: Denies blurring,significant itching ENT: Denies earache, sore throat, and hoarseness. Cardiovascular: Denies chest pains, palpitations, dyspnea on exertion Respiratory: Denies cough, dyspnea at rest,wheeezing Breast: no concerns about lumps GI: Denies nausea, vomiting, diarrhea, constipation, change in bowel habits, abdominal pain, melena, hematochezia GU: Denies penile discharge, ED, urinary flow / outflow problems. No STD concerns. Musculoskeletal: KNEE HIP, BACK PAIN Derm: Denies rash, itching Neuro: Denies  paresthesias, frequent falls, frequent headaches Psych: Denies depression, anxiety Endocrine: WEIGHT GAIN AND HYPOTHYROID Heme: Denies enlarged lymph nodes Allergy: No hayfever   Physical Examination: BP 130/80  Pulse 77  Temp(Src) 98.4 F (36.9 C) (Oral)  Ht 6\' 1"  (1.854 m)  Wt 339 lb (153.769 kg)  BMI 44.74  kg/m2  SpO2 96%  Ideal Body Weight: Weight in (lb) to have BMI = 25: 189.1   Wt Readings  from Last 3 Encounters:  08/08/12 339 lb (153.769 kg)  01/04/12 330 lb 6.4 oz (149.868 kg)  12/08/11 334 lb (151.501 kg)    GEN: well developed, well nourished, no acute distress Eyes: conjunctiva and lids normal, PERRLA, EOMI ENT: TM clear, nares clear, oral exam WNL Neck: supple, no lymphadenopathy, no thyromegaly, no JVD Pulm: clear to auscultation and percussion, respiratory effort normal CV: regular rate and rhythm, S1-S2, no murmur, rub or gallop, no bruits, peripheral pulses normal and symmetric, no cyanosis, clubbing, edema or varicosities Chest: no scars, masses GI: soft, non-tender; no hepatosplenomegaly, masses; active bowel sounds all quadrants GU: no hernia, testicular mass, penile discharge, or prostate enlargement Lymph: no cervical, axillary or inguinal adenopathy MSK: gait normal, muscle tone and strength WNL, no joint swelling, effusions, discoloration, crepitus  Knee ROM 0 -115 B. Flexion pinch + and some crepitus. SKIN: clear, good turgor, color WNL, no rashes, lesions, or ulcerations Neuro: normal mental status, normal strength, sensation, and motion Psych: alert; oriented to person, place and time, normally interactive and not anxious or depressed in appearance.   Assessment and Plan:  Routine general medical examination at a health care facility: The patient's preventative maintenance and recommended screening tests for an annual wellness exam were reviewed in full today. Brought up to date unless services declined.  Counselled on the importance of diet, exercise, and its role in overall health and mortality. The patient's FH and SH was reviewed, including their home life, tobacco status, and drug and alcohol status.   Obstructive sleep apnea - Plan: Ambulatory referral to Pulmonology: strong clinical suspicion for OSA - consult Sleep Med  Unspecified hypothyroidism - Plan: TSH: start supplementation and recheck TSH in 1 month  Morbid obesity  Orders Today:   Orders Placed This Encounter  Procedures  . TSH    Standing Status: Future     Number of Occurrences:      Standing Expiration Date: 08/08/2013  . Ambulatory referral to Pulmonology    Referral Priority:  Routine    Referral Type:  Consultation    Referral Reason:  Specialty Services Required    Requested Specialty:  Pulmonary Disease    Number of Visits Requested:  1    Updated Medication List: (Includes new medications, updates to list, dose adjustments) Meds ordered this encounter  Medications  . levothyroxine (SYNTHROID, LEVOTHROID) 50 MCG tablet    Sig: Take 1 tablet (50 mcg total) by mouth daily.    Dispense:  30 tablet    Refill:  3    Medications Discontinued: Medications Discontinued During This Encounter  Medication Reason  . RASPBERRY KETONES PO Error      Signed, Shell Yandow T. Aubrina Nieman, MD 08/08/2012 9:07 AM

## 2012-08-10 ENCOUNTER — Encounter: Payer: Self-pay | Admitting: Family Medicine

## 2012-08-29 ENCOUNTER — Ambulatory Visit (INDEPENDENT_AMBULATORY_CARE_PROVIDER_SITE_OTHER): Payer: BC Managed Care – PPO | Admitting: Family Medicine

## 2012-08-29 ENCOUNTER — Encounter: Payer: Self-pay | Admitting: Family Medicine

## 2012-08-29 ENCOUNTER — Ambulatory Visit (INDEPENDENT_AMBULATORY_CARE_PROVIDER_SITE_OTHER): Payer: BC Managed Care – PPO | Admitting: Pulmonary Disease

## 2012-08-29 ENCOUNTER — Encounter: Payer: Self-pay | Admitting: Pulmonary Disease

## 2012-08-29 VITALS — BP 158/110 | HR 71 | Temp 98.2°F | Ht 71.75 in | Wt 338.4 lb

## 2012-08-29 VITALS — BP 130/84 | HR 76 | Temp 97.4°F | Wt 336.0 lb

## 2012-08-29 DIAGNOSIS — R030 Elevated blood-pressure reading, without diagnosis of hypertension: Secondary | ICD-10-CM

## 2012-08-29 DIAGNOSIS — IMO0001 Reserved for inherently not codable concepts without codable children: Secondary | ICD-10-CM

## 2012-08-29 DIAGNOSIS — G4733 Obstructive sleep apnea (adult) (pediatric): Secondary | ICD-10-CM

## 2012-08-29 HISTORY — DX: Obstructive sleep apnea (adult) (pediatric): G47.33

## 2012-08-29 NOTE — Progress Notes (Signed)
Nature conservation officer at [facility] [address] [place] [address] Phone: [phone] Fax: [phone]  Date:  08/29/2012   Name:  Steve Ashley   DOB:  1965-11-27   MRN:  [id number] Gender: male Age: 47 y.o.  Primary Physician:  Hannah Beat, MD  Evaluating MD: Hannah Beat, MD   Chief Complaint: Hypertension   History of Present Illness:  Steve Ashley is a 47 y.o. pleasant patient who presents with the following:  BP: 128/78 on my manual with thigh cuff.  The patient walked in our office, and I was asked to see him urgently to evaluate him for potential hypertension. He was at the pulmonary clinic, and they obtained a blood pressure reading of 160/110 and asked him to followup in our office.  He is currently asymptomatic, has no headache, blurred vision, and generally has been feeling well.  I remember this patient well and he is a Secondary school teacher at [employer].  Patient Active Problem List  Diagnosis  . HYPOTHYROIDISM  . HYPERLIPIDEMIA  . HALLUX RIGIDUS  . Morbid obesity  . OSA (obstructive sleep apnea)    Past Medical History  Diagnosis Date  . Syndesmotic ankle sprain yrs ago  . Ankle fracture yrs ago    left   . Morbid obesity   . Arthritis   . Hyperlipidemia   . Asthma      hx of childhood asthma, none current  . Morbid obesity 08/10/2012    Past Surgical History  Procedure Laterality Date  . Ankle surgery  age 47    left ankle   . Knee arthroscopy  yrs ago    right  . Thumb arthroscopy  age 22    left   . Ventral hernia repair  12/08/2011    Procedure: LAPAROSCOPIC VENTRAL HERNIA;  Surgeon: Atilano Ina, MD,FACS;  Location: [facility];  Service: General;  Laterality: N/A;    History   Social History  . Marital Status: Legally Separated    Spouse Name: N/A    Number of Children: N/A  . Years of Education: N/A   Occupational History  . PE teacher    Social History Main Topics  . Smoking status: Never  Smoker   . Smokeless tobacco: Never Used  . Alcohol Use: Yes     Comment: occassional  . Drug Use: No  . Sexually Active: Not on file   Other Topics Concern  . Not on file   Social History Narrative  . No narrative on file    Family History  Problem Relation Age of Onset  . Hypertension Mother   . Heart disease Neg Hx   . Cancer Father     prostate    Allergies  Allergen Reactions  . Latex Itching    Medication list has been reviewed and updated.  Outpatient Prescriptions Prior to Visit  Medication Sig Dispense Refill  . GARCINIA CAMBOGIA-CHROMIUM PO Take 3 tablets by mouth daily.      Marland Kitchen levothyroxine (SYNTHROID, LEVOTHROID) 50 MCG tablet Take 1 tablet (50 mcg total) by mouth daily.  30 tablet  3   No facility-administered medications prior to visit.    Review of Systems:   GEN: No acute illnesses, no fevers, chills. GI: No n/v/d, eating normally Pulm: No SOB Interactive and getting along well at home.  Otherwise, ROS is as per the HPI.   Physical Examination: BP 130/84  Pulse 76  Temp(Src) 97.4 F (36.3 C)  Wt 336 lb (152.409 kg)  BMI 45.91 kg/m2  Ideal Body Weight:     GEN: WDWN, NAD, Non-toxic, Alert & Oriented x 3 HEENT: Atraumatic, Normocephalic.  Ears and Nose: No external deformity. EXTR: No clubbing/cyanosis/edema NEURO: Normal gait.  PSYCH: Normally interactive. Conversant. Not depressed or anxious appearing.  Calm demeanor.    Assessment and Plan: Elevated blood pressure   I obtained 3 blood pressures and had an additional nurse check the blood pressure for comparison. All ranged 128-138/78-84.  We obtained all of these blood pressures with a thigh cuff, since the patient is 340 pounds a large adult blood pressure cuff is not large enough for his arm, and I am suspicious that this is what caused his blood pressure to appear quite high earlier. At its current level, urged him to continue to work on his weight.  He wanted me to make  sure Dr. Shelle Iron got a copy of my note. I am ok with his BP, and I feel pretty confident that my BP readings are accurate.  Signed, Elpidio Galea. Steve Elena, MD 08/29/2012 12:47 PM

## 2012-08-29 NOTE — Patient Instructions (Addendum)
Will do home sleep testing to evaluate you for sleep apnea Will call you with results when available.  Your blood pressure today is elevated, and recommend calling Dr. Durel Salts office to let them know, and they can pull up this visit in the computer.

## 2012-08-29 NOTE — Assessment & Plan Note (Signed)
The patient has a classic history for clinically significant sleep apnea.  He has loud snoring as well as an abnormal breathing pattern during sleep, nonrestorative sleep, and also definite daytime sleepiness with inactivity.  He is morbidly obese and does have an abnormal upper airway.  I have had long discussion with him about sleep apnea, including its impact to his quality of life and cardiovascular health.  He will need to have a sleep study done, and I think he is an excellent candidate for home sleep testing.  The patient is agreeable to this approach.

## 2012-08-29 NOTE — Progress Notes (Signed)
Subjective:    Patient ID: Steve Ashley, male    DOB: Jan 27, 1966, 47 y.o.   MRN: [id number]  HPI The patient is a 47 year old male who I've been asked to see for possible obstructive sleep apnea.  He has been noted to have loud snoring, as well as an abnormal breathing pattern during sleep.  He is not rested in the mornings upon arising, and admits to definite sleep pressure during the day with inactivity.  He will often fall sleep at night watching television or reading, and does note sleepiness driving longer distances.  He has no issues driving shorter distances.  The patient states that his weight has been up and down in the last few years, but most recently has lost 15 pounds.  His Epworth Sleepiness Scale today is 12.  Sleep Questionnaire What time do you typically go to bed?( Between what hours) 11p-1a 11p-1a at 0938 on 08/29/12 by Nita Sells, CMA How long does it take you to fall asleep? approx 1 hr  approx 1 hr  at 0938 on 08/29/12 by Marjo Bicker Mabe, CMA How many times during the night do you wake up? 2 2 at 0938 on 08/29/12 by Nita Sells, CMA What time do you get out of bed to start your day? 0600 0600 at 0938 on 08/29/12 by Nita Sells, CMA Do you drive or operate heavy machinery in your occupation? No No at 0938 on 08/29/12 by Nita Sells, CMA How much has your weight changed (up or down) over the past two years? (In pounds) 15 lb (6.804 kg)15 lb (6.804 kg) decreased at 0938 on 08/29/12 by Nita Sells, CMA Have you ever had a sleep study before? No No at 0938 on 08/29/12 by Nita Sells, CMA Do you currently use CPAP? No No at 0938 on 08/29/12 by Marjo Bicker Mabe, CMA Do you wear oxygen at any time? No No at 0938 on 08/29/12 by Nita Sells, CMA    Review of Systems  Constitutional: Negative for fever and unexpected weight change.  HENT: Negative for ear pain, nosebleeds, congestion, sore throat, rhinorrhea, sneezing, trouble swallowing, dental  problem, postnasal drip and sinus pressure.   Eyes: Negative for redness and itching.  Respiratory: Negative for cough, chest tightness, shortness of breath and wheezing.   Cardiovascular: Negative for palpitations and leg swelling.  Gastrointestinal: Negative for nausea and vomiting.  Genitourinary: Negative for dysuria.  Musculoskeletal: Negative for joint swelling.  Skin: Negative for rash.  Neurological: Negative for headaches.  Hematological: Does not bruise/bleed easily.  Psychiatric/Behavioral: Positive for dysphoric mood ( patient going through a divorce currently). The patient is not nervous/anxious.        Objective:   Physical Exam Constitutional: morbidly obese male, no acute distress  HENT:  Nares patent without discharge, but swollen turbinates.  Oropharynx without exudate, palate and uvula are thick and elongated, +side wall narrowing.   Eyes:  Perrla, eomi, no scleral icterus  Neck:  No JVD, no TMG  Cardiovascular:  Normal rate, regular rhythm, no rubs or gallops.  No murmurs        Intact distal pulses  Pulmonary :  Normal breath sounds, no stridor or respiratory distress   No rales, rhonchi, or wheezing  Abdominal:  Soft, nondistended, bowel sounds present.  No tenderness noted.   Musculoskeletal:  1+ lower extremity edema noted.  Lymph Nodes:  No cervical lymphadenopathy noted  Skin:  No cyanosis noted  Neurologic:  Appears sleepy,  but appropriate, moves all 4 extremities without obvious deficit.         Assessment & Plan:

## 2012-12-12 ENCOUNTER — Encounter: Payer: Self-pay | Admitting: Family Medicine

## 2012-12-12 ENCOUNTER — Ambulatory Visit (INDEPENDENT_AMBULATORY_CARE_PROVIDER_SITE_OTHER): Payer: BC Managed Care – PPO | Admitting: Family Medicine

## 2012-12-12 ENCOUNTER — Ambulatory Visit (INDEPENDENT_AMBULATORY_CARE_PROVIDER_SITE_OTHER)
Admission: RE | Admit: 2012-12-12 | Discharge: 2012-12-12 | Disposition: A | Discharge status: Home / Self Care | Payer: BC Managed Care – PPO | Source: Ambulatory Visit | Attending: Family Medicine | Admitting: Family Medicine

## 2012-12-12 VITALS — BP 130/80 | HR 105 | Temp 98.4°F | Ht 71.75 in | Wt 335.0 lb

## 2012-12-12 DIAGNOSIS — M19249 Secondary osteoarthritis, unspecified hand: Secondary | ICD-10-CM

## 2012-12-12 DIAGNOSIS — M25539 Pain in unspecified wrist: Secondary | ICD-10-CM

## 2012-12-12 DIAGNOSIS — M25532 Pain in left wrist: Secondary | ICD-10-CM

## 2012-12-12 DIAGNOSIS — M1830 Unilateral post-traumatic osteoarthritis of first carpometacarpal joint, unspecified hand: Secondary | ICD-10-CM

## 2012-12-12 NOTE — Progress Notes (Signed)
Nature conservation officer at [facility] [address] [place] [address] Phone: [phone] Fax: [phone]  Date:  12/12/2012   Name:  Steve Ashley   DOB:  05-24-1966   MRN:  [id number] Gender: male Age: 47 y.o.  Primary Physician:  Hannah Beat, MD  Evaluating MD: Hannah Beat, MD   Chief Complaint: thumb pain   History of Present Illness:  Steve Ashley is a 47 y.o. pleasant patient who presents with the following:  Left thumb --- at the [facility] joint.  Was hitting the heavy bag and caught it like that.  Now with some pain around the [facility] Old trauma and fracture with ORIF when he was younger.  Now with some pain, mild swelling.   Patient Active Problem List   Diagnosis Date Noted  . OSA (obstructive sleep apnea) 08/29/2012  . Morbid obesity 08/10/2012  . HYPOTHYROIDISM 01/06/2010  . HYPERLIPIDEMIA 01/06/2010  . HALLUX RIGIDUS 07/20/2009    Past Medical History  Diagnosis Date  . Syndesmotic ankle sprain yrs ago  . Ankle fracture yrs ago    left   . Morbid obesity   . Arthritis   . Hyperlipidemia   . Asthma      hx of childhood asthma, none current  . Morbid obesity 08/10/2012    Past Surgical History  Procedure Laterality Date  . Ankle surgery  age 38    left ankle   . Knee arthroscopy  yrs ago    right  . Thumb arthroscopy  age 41    left   . Ventral hernia repair  12/08/2011    Procedure: LAPAROSCOPIC VENTRAL HERNIA;  Surgeon: Atilano Ina, MD,FACS;  Location: [facility];  Service: General;  Laterality: N/A;    History   Social History  . Marital Status: Legally Separated    Spouse Name: N/A    Number of Children: N/A  . Years of Education: N/A   Occupational History  . PE teacher    Social History Main Topics  . Smoking status: Never Smoker   . Smokeless tobacco: Never Used  . Alcohol Use: Yes     Comment: occassional  . Drug Use: No  . Sexually Active: Not on file   Other Topics Concern  . Not on file   Social History  Narrative  . No narrative on file    Family History  Problem Relation Age of Onset  . Hypertension Mother   . Heart disease Neg Hx   . Cancer Father     prostate    Allergies  Allergen Reactions  . Latex Itching    Medication list has been reviewed and updated.  Outpatient Prescriptions Prior to Visit  Medication Sig Dispense Refill  . GARCINIA CAMBOGIA-CHROMIUM PO Take 3 tablets by mouth daily.      Marland Kitchen levothyroxine (SYNTHROID, LEVOTHROID) 50 MCG tablet Take 1 tablet (50 mcg total) by mouth daily.  30 tablet  3   No facility-administered medications prior to visit.    Review of Systems:   GEN: No fevers, chills. Nontoxic. Primarily MSK c/o today. MSK: Detailed in the HPI GI: tolerating PO intake without difficulty Neuro: No numbness, parasthesias, or tingling associated. Otherwise the pertinent positives of the ROS are noted above.    Physical Examination: BP 130/80  Pulse 105  Temp(Src) 98.4 F (36.9 C) (Oral)  Ht 5' 11.75" (1.822 m)  Wt 335 lb (151.955 kg)  BMI 45.77 kg/m2  SpO2 98%  Ideal Body Weight: Weight  in (lb) to have BMI = 25: 182.7   GEN: WDWN, NAD, Non-toxic, Alert & Oriented x 3 HEENT: Atraumatic, Normocephalic.  Ears and Nose: No external deformity. EXTR: No clubbing/cyanosis/edema NEURO: Normal gait.  PSYCH: Normally interactive. Conversant. Not depressed or anxious appearing.  Calm demeanor.   Hand: L Ecchymosis or edema: neg ROM wrist/hand/digits/elbow: full  Carpals, MCP's, digits: L CMC TTP Distal Ulna and Radius: NT Ecchymosis or edema: minimal swelling Cysts/nodules: neg Finkelstein's test: neg Snuffbox tenderness: neg Scaphoid tubercle: NT Hook of Hamate: NT Resisted supination: NT Full composite fist Grip, all digits: 5/5 str No tenosynovitis Axial load test: neg Atrophy: neg  Hand sensation: intact  Dg Wrist Complete Left  12/12/2012   *RADIOLOGY REPORT*  Clinical Data: Wrist pain post trauma  LEFT WRIST - COMPLETE  3+ VIEW  Comparison: None.  Findings: Five views of the left wrist submitted.  No acute fracture or subluxation.  Well corticated bony fragment adjacent to radial styloid process is most likely due to separate ossification center or due to prior injury. Degenerative changes are noted first carpal metacarpal joint.  IMPRESSION: No acute fracture or subluxation.  Well corticated bony fragment adjacent to radial styloid process is most likely due to separate ossification center or due to prior injury. Degenerative changes are noted first carpal metacarpal joint.   Original Report Authenticated By: Natasha Mead, M.D.    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 Thumb spica splint x 7-10 days  Signed, Aara Jacquot T. Kahlin Mark, MD 12/12/2012 12:15 PM

## 2013-05-09 ENCOUNTER — Encounter: Payer: Self-pay | Admitting: Family Medicine

## 2013-05-09 ENCOUNTER — Ambulatory Visit (INDEPENDENT_AMBULATORY_CARE_PROVIDER_SITE_OTHER): Payer: BC Managed Care – PPO | Admitting: Family Medicine

## 2013-05-09 VITALS — BP 130/90 | HR 88 | Temp 98.4°F | Ht 71.75 in | Wt 336.8 lb

## 2013-05-09 DIAGNOSIS — J209 Acute bronchitis, unspecified: Secondary | ICD-10-CM

## 2013-05-09 DIAGNOSIS — R059 Cough, unspecified: Secondary | ICD-10-CM

## 2013-05-09 DIAGNOSIS — R05 Cough: Secondary | ICD-10-CM

## 2013-05-09 DIAGNOSIS — Z6841 Body Mass Index (BMI) 40.0 and over, adult: Secondary | ICD-10-CM | POA: Insufficient documentation

## 2013-05-09 MED ORDER — HYDROCODONE-HOMATROPINE 5-1.5 MG/5ML PO SYRP: ORAL_SOLUTION | ORAL | Status: DC

## 2013-05-09 MED ORDER — AZITHROMYCIN 250 MG PO TABS: ORAL_TABLET | ORAL | Status: DC

## 2013-05-09 NOTE — Progress Notes (Signed)
Pre-visit discussion using our clinic review tool. No additional management support is needed unless otherwise documented below in the visit note.  

## 2013-05-09 NOTE — Progress Notes (Signed)
Patient Name: Steve Ashley Date of Birth: 1965-09-18 Medical Record Number: [id number]  History of Present Illness:  Patent presents with runny nose, sneezing, cough, sore throat, malaise and minimal / low-grade fever .  Has been sick and son is better. 1 week ago, bad cough.  Green sputum, nyquil, flu meds, but it has not helped it.  + recent exposure to others with similar symptoms.   The patent denies sore throat as the primary complaint. Denies sthortness of breath/wheezing, high fever, chest pain, rhinits for more than 14 days, significant myalgia, otalgia, facial pain, abdominal pain, changes in bowel or bladder.  PMH, PHS, Allergies, Problem List, Medications, Family History, and Social History have all been reviewed.  Patient Active Problem List   Diagnosis Date Noted  . Severe obesity (BMI >= 40) 05/09/2013  . OSA (obstructive sleep apnea) 08/29/2012  . HYPOTHYROIDISM 01/06/2010  . HYPERLIPIDEMIA 01/06/2010  . HALLUX RIGIDUS 07/20/2009    Past Medical History  Diagnosis Date  . Syndesmotic ankle sprain yrs ago  . Ankle fracture yrs ago    left   . Morbid obesity   . Arthritis   . Hyperlipidemia   . Asthma      hx of childhood asthma, none current  . Morbid obesity 08/10/2012    Past Surgical History  Procedure Laterality Date  . Ankle surgery  age 39    left ankle   . Knee arthroscopy  yrs ago    right  . Thumb arthroscopy  age 18    left   . Ventral hernia repair  12/08/2011    Procedure: LAPAROSCOPIC VENTRAL HERNIA;  Surgeon: Atilano Ina, MD,FACS;  Location: [facility];  Service: General;  Laterality: N/A;    History   Social History  . Marital Status: Legally Separated    Spouse Name: N/A    Number of Children: N/A  . Years of Education: N/A   Occupational History  . PE teacher    Social History Main Topics  . Smoking status: Never Smoker   . Smokeless tobacco: Never Used  . Alcohol Use: Yes     Comment: occassional  . Drug Use: No  .  Sexual Activity: Not on file   Other Topics Concern  . Not on file   Social History Narrative  . No narrative on file    Family History  Problem Relation Age of Onset  . Hypertension Mother   . Heart disease Neg Hx   . Cancer Father     prostate    Allergies  Allergen Reactions  . Latex Itching    Medication list reviewed and updated in full in [facility].  Review of Systems: as above, eating and drinking - tolerating PO. Urinating normally. No excessive vomitting or diarrhea. O/w as above.  Physical Exam:  Filed Vitals:   05/09/13 1137  BP: 130/90  Pulse: 88  Temp: 98.4 F (36.9 C)  TempSrc: Oral  Height: 5' 11.75" (1.822 m)  Weight: 336 lb 12 oz (152.749 kg)  SpO2: 98%    GEN: WDWN, Non-toxic, Atraumatic, normocephalic. A and O x 3. HEENT: Oropharynx clear without exudate, MMM, no significant LAD, mild rhinnorhea Ears: TM clear, COL visualized with good landmarks CV: RRR, no m/g/r. Pulm: CTA B, no wheezes, rhonchi, or crackles, normal respiratory effort. EXT: no c/c/e Psych: well oriented, neither depressed nor anxious in appearance   Acute bronchitis  Cough  Acute bronchitis: discussed plan of care. Given length of symptoms and  overall history, will treat with ABX in this case. Continue with additional supportive care, cough medications, liquids, sleep, steam / vaporizer.   New medications, updates to list, dose adjustments: Meds ordered this encounter  Medications  . HYDROcodone-homatropine (HYCODAN) 5-1.5 MG/5ML syrup    Sig: 1 tsp po at night before bed prn cough    Dispense:  240 mL    Refill:  0  . azithromycin (ZITHROMAX Z-PAK) 250 MG tablet    Sig: Take 2 tablets (500 mg) on  Day 1,  followed by 1 tablet (250 mg) once daily on Days 2 through 5.    Dispense:  6 each    Refill:  0    Signed,  Vasil Juhasz T. Ciro Tashiro, MD, CAQ Sports Medicine  [facility] [address] [place] [address] Phone:  [phone] Fax: [phone]  Updated Complete Medication List:   Medication List       This list is accurate as of: 05/09/13  2:35 PM.  Always use your most recent med list.               azithromycin 250 MG tablet  Commonly known as:  ZITHROMAX Z-PAK  Take 2 tablets (500 mg) on  Day 1,  followed by 1 tablet (250 mg) once daily on Days 2 through 5.     HYDROcodone-homatropine 5-1.5 MG/5ML syrup  Commonly known as:  HYCODAN  1 tsp po at night before bed prn cough     levothyroxine 50 MCG tablet  Commonly known as:  SYNTHROID, LEVOTHROID  Take 1 tablet (50 mcg total) by mouth daily.

## 2013-10-10 ENCOUNTER — Ambulatory Visit (INDEPENDENT_AMBULATORY_CARE_PROVIDER_SITE_OTHER): Payer: BC Managed Care – PPO | Admitting: Family Medicine

## 2013-10-10 ENCOUNTER — Encounter: Payer: Self-pay | Admitting: Family Medicine

## 2013-10-10 VITALS — BP 124/90 | HR 91 | Temp 97.3°F | Ht 70.32 in | Wt 342.8 lb

## 2013-10-10 DIAGNOSIS — G4733 Obstructive sleep apnea (adult) (pediatric): Secondary | ICD-10-CM

## 2013-10-10 DIAGNOSIS — Z13220 Encounter for screening for lipoid disorders: Secondary | ICD-10-CM

## 2013-10-10 DIAGNOSIS — Z0000 Encounter for general adult medical examination without abnormal findings: Secondary | ICD-10-CM

## 2013-10-10 DIAGNOSIS — R5381 Other malaise: Secondary | ICD-10-CM

## 2013-10-10 DIAGNOSIS — E7800 Pure hypercholesterolemia, unspecified: Secondary | ICD-10-CM

## 2013-10-10 DIAGNOSIS — Z125 Encounter for screening for malignant neoplasm of prostate: Secondary | ICD-10-CM

## 2013-10-10 DIAGNOSIS — Z8042 Family history of malignant neoplasm of prostate: Secondary | ICD-10-CM

## 2013-10-10 DIAGNOSIS — E039 Hypothyroidism, unspecified: Secondary | ICD-10-CM

## 2013-10-10 DIAGNOSIS — E785 Hyperlipidemia, unspecified: Secondary | ICD-10-CM

## 2013-10-10 DIAGNOSIS — R5383 Other fatigue: Secondary | ICD-10-CM

## 2013-10-10 DIAGNOSIS — Z131 Encounter for screening for diabetes mellitus: Secondary | ICD-10-CM

## 2013-10-10 LAB — LIPID PANEL
Cholesterol: 235 mg/dL — ABNORMAL HIGH (ref 0–200)
HDL: 41.7 mg/dL (ref >39.00)
LDL Cholesterol: 172 mg/dL — ABNORMAL HIGH (ref 0–99)
Total CHOL/HDL Ratio: 6
Triglycerides: 109 mg/dL (ref 0.0–149.0)
VLDL: 21.8 mg/dL (ref 0.0–40.0)

## 2013-10-10 LAB — CBC WITH DIFFERENTIAL/PLATELET
Basophils Absolute: 0 10*3/uL (ref 0.0–0.1)
Basophils Relative: 0.6 % (ref 0.0–3.0)
Eosinophils Absolute: 0.3 10*3/uL (ref 0.0–0.7)
Eosinophils Relative: 4.2 % (ref 0.0–5.0)
HCT: 44.8 % (ref 39.0–52.0)
Hemoglobin: 14.7 g/dL (ref 13.0–17.0)
Lymphocytes Relative: 30.2 % (ref 12.0–46.0)
Lymphs Abs: 2.3 10*3/uL (ref 0.7–4.0)
MCHC: 32.8 g/dL (ref 30.0–36.0)
MCV: 85.7 fl (ref 78.0–100.0)
Monocytes Absolute: 0.7 10*3/uL (ref 0.1–1.0)
Monocytes Relative: 9 % (ref 3.0–12.0)
Neutro Abs: 4.3 10*3/uL (ref 1.4–7.7)
Neutrophils Relative %: 56 % (ref 43.0–77.0)
Platelets: 277 10*3/uL (ref 150.0–400.0)
RBC: 5.23 Mil/uL (ref 4.22–5.81)
RDW: 15.3 % (ref 11.5–15.5)
WBC: 7.7 10*3/uL (ref 4.0–10.5)

## 2013-10-10 LAB — COMPREHENSIVE METABOLIC PANEL
ALT: 21 U/L (ref 0–53)
AST: 22 U/L (ref 0–37)
Albumin: 4 g/dL (ref 3.5–5.2)
Alkaline Phosphatase: 63 U/L (ref 39–117)
BUN: 13 mg/dL (ref 6–23)
CO2: 28 mEq/L (ref 19–32)
Calcium: 9.4 mg/dL (ref 8.4–10.5)
Chloride: 105 mEq/L (ref 96–112)
Creatinine, Ser: 1 mg/dL (ref 0.4–1.5)
GFR: 100.96 mL/min (ref >60.00)
Glucose, Bld: 83 mg/dL (ref 70–99)
Potassium: 4.3 mEq/L (ref 3.5–5.1)
Sodium: 139 mEq/L (ref 135–145)
Total Bilirubin: 1.2 mg/dL (ref 0.2–1.2)
Total Protein: 7.4 g/dL (ref 6.0–8.3)

## 2013-10-10 LAB — HEMOGLOBIN A1C: Hgb A1c MFr Bld: 6.1 % (ref 4.6–6.5)

## 2013-10-10 LAB — TSH: TSH: 7.13 u[IU]/mL — ABNORMAL HIGH (ref 0.35–4.50)

## 2013-10-10 LAB — PSA: PSA: 0.51 ng/mL (ref 0.10–4.00)

## 2013-10-10 MED ORDER — LEVOTHYROXINE SODIUM 50 MCG PO TABS: 50.0000 ug | ORAL_TABLET | Freq: Every day | ORAL | Status: DC

## 2013-10-10 NOTE — Progress Notes (Signed)
Pre visit review using our clinic review tool, if applicable. No additional management support is needed unless otherwise documented below in the visit note. 

## 2013-10-10 NOTE — Progress Notes (Signed)
[address] [place] [address] Phone: [phone] Fax: [phone]  Patient ID: Steve Ashley MRN: [id number], DOB: 05/31/1965, 48 y.o. Date of Encounter: 10/10/2013  Primary Physician:  Hannah BeatSpencer Nell Schrack, MD   Chief Complaint: Annual Exam  Subjective:   History of Present Illness:  Steve Ashley is a 48 y.o. pleasant patient who presents with the following:  Preventative Health Maintenance Visit as well as some acute issues.  Health Maintenance Summary Reviewed and updated, unless pt declines services.  Tobacco History Reviewed. Alcohol: No concerns, no excessive use Exercise Habits: minimal now STD concerns: no risk or activity to increase risk Drug Use: None Encouraged self-testicular check  Health Maintenance  Topic Date Due  . Tetanus/tdap  10/01/1983  . Influenza Vaccine  12/28/2013    There is no immunization history for the selected administration types on file for this patient.  Osa? [name]. Email.  Sleep center: set up.  Patient was seen by Dr. Shelle Ironlance about a year ago, who recommended sleep study, but the patient did not want to do it at the time.  Body mass index is 48.74 kg/(m^2).  Still tired all the time. "Everyone in my family has sleep apnea" His father just died 1 month ago, so he is ready to get checked and try CPAP  Thyroid: No symptoms. Labs reviewed. Denies cold / heat intolerance, dry skin, hair loss. No goiter.  Lab Results  Component Value Date   TSH 7.13* 10/10/2013    Lipids: No meds Lipids:    Component Value Date/Time   CHOL 235* 10/10/2013 0921   TRIG 109.0 10/10/2013 0921   HDL 41.70 10/10/2013 0921   LDLDIRECT 170.9 08/01/2012 1030   VLDL 21.8 10/10/2013 0921   CHOLHDL 6 10/10/2013 0921    Lab Results  Component Value Date   ALT 21 10/10/2013   AST 22 10/10/2013   ALKPHOS 63 10/10/2013   BILITOT 1.2 10/10/2013    Father with history of metastatic prostate ca. Will check PSA and prostate check.  Patient Active  Problem List   Diagnosis Date Noted  . Family history of prostate cancer 10/10/2013  . Severe obesity (BMI >= 40) 05/09/2013  . OSA (obstructive sleep apnea) 08/29/2012  . HYPOTHYROIDISM 01/06/2010  . HYPERLIPIDEMIA 01/06/2010  . HALLUX RIGIDUS 07/20/2009   Past Medical History  Diagnosis Date  . Syndesmotic ankle sprain yrs ago  . Ankle fracture yrs ago    left   . Morbid obesity   . Arthritis   . Hyperlipidemia   . Asthma      hx of childhood asthma, none current  . Morbid obesity 08/10/2012   Past Surgical History  Procedure Laterality Date  . Ankle surgery  age 48    left ankle   . Knee arthroscopy  yrs ago    right  . Thumb arthroscopy  age 48    left   . Ventral hernia repair  12/08/2011    Procedure: LAPAROSCOPIC VENTRAL HERNIA;  Surgeon: Atilano InaEric M Wilson, MD,FACS;  Location: [facility];  Service: General;  Laterality: N/A;   History   Social History  . Marital Status: Legally Separated    Spouse Name: N/A    Number of Children: N/A  . Years of Education: N/A   Occupational History  . PE teacher    Social History Main Topics  . Smoking status: Never Smoker   . Smokeless tobacco: Never Used  . Alcohol Use: Yes     Comment: occassional  .  Drug Use: No  . Sexual Activity: Not on file   Other Topics Concern  . Not on file   Social History Narrative  . No narrative on file   Family History  Problem Relation Age of Onset  . Hypertension Mother   . Heart disease Neg Hx   . Cancer Father     prostate   Allergies  Allergen Reactions  . Latex Itching   Medication list has been reviewed and updated.  Review of Systems:  General: Denies fever, chills, sweats. No significant weight loss. Eyes: Denies blurring,significant itching ENT: Denies earache, sore throat, and hoarseness. Cardiovascular: Denies chest pains, palpitations, dyspnea on exertion Respiratory: Denies cough, dyspnea at rest,wheeezing Breast: no concerns about lumps GI: Denies nausea,  vomiting, diarrhea, constipation, change in bowel habits, abdominal pain, melena, hematochezia GU: Denies penile discharge, ED, urinary flow / outflow problems. No STD concerns. Musculoskeletal: Denies back pain, joint pain Derm: Denies rash, itching Neuro: Denies  paresthesias, frequent falls, frequent headaches Psych: Denies depression, anxiety. GRIEF OVER HIS FATHER'S PASSING, BUT EVERYTHING IS GOING WELL WITH HIS CHILDREN. Endocrine: Denies cold intolerance, heat intolerance, polydipsia Heme: Denies enlarged lymph nodes Allergy: No hayfever  Objective:   Physical Examination: BP 124/90  Pulse 91  Temp(Src) 97.3 F (36.3 C) (Oral)  Ht 5' 10.32" (1.786 m)  Wt 342 lb 12 oz (155.47 kg)  BMI 48.74 kg/m2   Wt Readings from Last 3 Encounters:  10/10/13 342 lb 12 oz (155.47 kg)  05/09/13 336 lb 12 oz (152.749 kg)  12/12/12 335 lb (151.955 kg)    GEN: well developed, well nourished, no acute distress Eyes: conjunctiva and lids normal, PERRLA, EOMI ENT: TM clear, nares clear, oral exam WNL Neck: supple, no lymphadenopathy, no thyromegaly, no JVD Pulm: clear to auscultation and percussion, respiratory effort normal CV: regular rate and rhythm, S1-S2, no murmur, rub or gallop, no bruits, peripheral pulses normal and symmetric, no cyanosis, clubbing, edema or varicosities Chest: no scars, masses GI: soft, non-tender; no hepatosplenomegaly, masses; active bowel sounds all quadrants GU: no hernia, testicular mass, penile discharge, or prostate enlargement Lymph: no cervical, axillary or inguinal adenopathy MSK: gait normal, muscle tone and strength WNL, no joint swelling, effusions, discoloration, crepitus  SKIN: clear, good turgor, color WNL, no rashes, lesions, or ulcerations Neuro: normal mental status, normal strength, sensation, and motion Psych: alert; oriented to person, place and time, normally interactive and not anxious or depressed in appearance.  All labs reviewed with  patient.  Lipids:    Component Value Date/Time   CHOL 235* 10/10/2013 0921   TRIG 109.0 10/10/2013 0921   HDL 41.70 10/10/2013 0921   LDLDIRECT 170.9 08/01/2012 1030   VLDL 21.8 10/10/2013 0921   CHOLHDL 6 10/10/2013 0921   CBC:    Component Value Date/Time   WBC 7.7 10/10/2013 0921   HGB 14.7 10/10/2013 0921   HCT 44.8 10/10/2013 0921   PLT 277.0 10/10/2013 0921   MCV 85.7 10/10/2013 0921   NEUTROABS 4.3 10/10/2013 0921   LYMPHSABS 2.3 10/10/2013 0921   MONOABS 0.7 10/10/2013 0921   EOSABS 0.3 10/10/2013 0921   BASOSABS 0.0 10/10/2013 0921   Basic Metabolic Panel:    Component Value Date/Time   NA 139 10/10/2013 0921   K 4.3 10/10/2013 0921   CL 105 10/10/2013 0921   CO2 28 10/10/2013 0921   BUN 13 10/10/2013 0921   CREATININE 1.0 10/10/2013 0921   GLUCOSE 83 10/10/2013 0921   CALCIUM 9.4 10/10/2013 0921  Lab Results  Component Value Date   ALT 21 10/10/2013   AST 22 10/10/2013   ALKPHOS 63 10/10/2013   BILITOT 1.2 10/10/2013   Lab Results  Component Value Date   TSH 7.13* 10/10/2013   Lab Results  Component Value Date   PSA 0.51 10/10/2013   PSA 0.46 08/01/2012   PSA 0.38 01/04/2010    Assessment & Plan:   Routine general medical examination at a health care facility - Plan: Comprehensive metabolic panel, TSH, CBC with Differential  Special screening for malignant neoplasm of prostate - Plan: PSA  Pure hypercholesterolemia - Plan: Lipid panel  Screening for lipoid disorders  Other malaise and fatigue - Plan: Comprehensive metabolic panel, TSH, CBC with Differential, Nocturnal polysomnography  Screening for diabetes mellitus - Plan: Hemoglobin A1c  Family history of prostate cancer  HYPERLIPIDEMIA: weight loss discussed at length.  OSA (obstructive sleep apnea) - Plan: Nocturnal polysomnography: We will set him up for a sleep study at the hospital, and when that is back, he can f/u with Dr. Shelle Iron if needed. I will send him a copy of this note. 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   Severe obesity (BMI >= 40) - Plan: Nocturnal polysomnography  Hypothyroid: TSH is high, we will increase to 75 mcg of synthroid and recheck in 1 month.  >15 minutes spent in Ashley to Ashley time with patient, >50% spent in counselling or coordination of care: management of above and discussion of grief, death, and family above HM exam.  Health Maintenance Exam: The patient's preventative maintenance and recommended screening tests for an annual wellness exam were reviewed in full today. Brought up to date unless services declined.  Counselled on the importance of diet, exercise, and its role in overall health and mortality. The patient's FH and SH was reviewed, including their home life, tobacco status, and drug and alcohol status.  Follow-up: No Follow-up on file. Or follow-up in 1 year for complete physical examination  New Prescriptions   LEVOTHYROXINE (SYNTHROID, LEVOTHROID) 75 MCG TABLET    Take 1 tablet (75 mcg total) by mouth daily.   Orders Placed This Encounter  Procedures  . Lipid panel  . Hemoglobin A1c  . Comprehensive metabolic panel  . TSH  . PSA  . CBC with Differential  . Nocturnal polysomnography    Signed,  Courney Garrod T. Nickia Boesen, MD, CAQ Sports Medicine   Patient's Medications  New Prescriptions   LEVOTHYROXINE (SYNTHROID, LEVOTHROID) 75 MCG TABLET    Take 1 tablet (75 mcg total) by mouth daily.  Previous Medications   No medications on file  Modified Medications   No medications on file  Discontinued Medications   LEVOTHYROXINE (SYNTHROID, LEVOTHROID) 50 MCG TABLET    Take 1 tablet (50 mcg total) by mouth daily.

## 2013-10-11 MED ORDER — LEVOTHYROXINE SODIUM 75 MCG PO TABS: 75.0000 ug | ORAL_TABLET | Freq: Every day | ORAL | Status: DC

## 2013-11-25 ENCOUNTER — Ambulatory Visit (HOSPITAL_BASED_OUTPATIENT_CLINIC_OR_DEPARTMENT_OTHER): Payer: BC Managed Care – PPO | Attending: Family Medicine | Admitting: Radiology

## 2013-11-25 VITALS — Ht 70.0 in | Wt 332.0 lb

## 2013-11-25 DIAGNOSIS — R5381 Other malaise: Secondary | ICD-10-CM

## 2013-11-25 DIAGNOSIS — G4733 Obstructive sleep apnea (adult) (pediatric): Secondary | ICD-10-CM | POA: Insufficient documentation

## 2013-11-25 DIAGNOSIS — R5383 Other fatigue: Secondary | ICD-10-CM

## 2013-12-04 DIAGNOSIS — G4710 Hypersomnia, unspecified: Secondary | ICD-10-CM

## 2013-12-04 DIAGNOSIS — G4730 Sleep apnea, unspecified: Secondary | ICD-10-CM

## 2013-12-04 NOTE — Sleep Study (Signed)
   NAME: Steve Ashley DATE OF BIRTH:  09/11/1965 MEDICAL RECORD NUMBER [id number]  LOCATION: [facility]  PHYSICIAN: [name],[name]  DATE OF STUDY: 11/25/2013  SLEEP STUDY TYPE: Nocturnal Polysomnogram               REFERRING PHYSICIAN: [name], [name], MD  INDICATION FOR STUDY: Hypersomnia with sleep apnea  EPWORTH SLEEPINESS SCORE:  17 HEIGHT: 5\' 10"  (177.8 cm)  WEIGHT: 332 lb (150.594 kg)    Body mass index is 47.64 kg/(m^2).  NECK SIZE: 17.5 in.  MEDICATIONS: Reviewed in the sleep record  SLEEP ARCHITECTURE: The patient had a total sleep time of 376 minutes with no slow-wave sleep and 75 minutes of REM. Sleep onset latency was normal at 4 minutes, and REM onset was rapid at 57 minutes. Sleep efficiency was excellent at 98%.  RESPIRATORY DATA: The patient was found to have 343 apneas and 128 obstructive hypopneas, giving him an AHI of 75 events per hour. The events occurred in all body positions, and there was very loud snoring noted throughout.  OXYGEN DATA: There was oxygen desaturation as low as 58% with the patient's obstructive events  CARDIAC DATA: PACs noted throughout  MOVEMENT/PARASOMNIA: The patient had no significant limb movements or other abnormal behaviors.  IMPRESSION/ RECOMMENDATION:    1) severe obstructive sleep apnea/hypopnea syndrome, with an AHI of 75 events per hour and oxygen desaturation as low as 58%. Treatment for this degree of sleep apnea should focus on a CPAP trial, as well as aggressive weight loss.  2) PACs noted throughout, but no clinically significant arrhythmias were seen.     Barbaraann ShareLANCE,[name] Diplomate, American Board of Sleep Medicine  ELECTRONICALLY SIGNED ON:  12/04/2013, 6:13 PM [facility] PH: [phone]   FX: [phone] ACCREDITED BY THE AMERICAN ACADEMY OF SLEEP MEDICINE

## 2013-12-11 ENCOUNTER — Telehealth: Payer: Self-pay | Admitting: Pulmonary Disease

## 2013-12-11 NOTE — Telephone Encounter (Signed)
[email or web address].Paco Cislo, CMA  

## 2013-12-11 NOTE — Telephone Encounter (Signed)
Victorino DikeJennifer, this pt needs OV with me to review his sleep study.  Thanks .

## 2013-12-12 NOTE — Telephone Encounter (Signed)
lmomtcb x 2  

## 2013-12-17 NOTE — Telephone Encounter (Signed)
appt set for tomorrow at 1:30pm. Carron CurieJennifer Delaynee Alred, CMA

## 2013-12-18 ENCOUNTER — Ambulatory Visit: Payer: BC Managed Care – PPO | Admitting: Pulmonary Disease

## 2014-01-09 ENCOUNTER — Ambulatory Visit (INDEPENDENT_AMBULATORY_CARE_PROVIDER_SITE_OTHER): Payer: BC Managed Care – PPO | Admitting: Pulmonary Disease

## 2014-01-09 ENCOUNTER — Encounter: Payer: Self-pay | Admitting: Pulmonary Disease

## 2014-01-09 VITALS — BP 130/86 | HR 79 | Temp 98.5°F | Ht 71.0 in | Wt 339.8 lb

## 2014-01-09 DIAGNOSIS — G4733 Obstructive sleep apnea (adult) (pediatric): Secondary | ICD-10-CM

## 2014-01-09 NOTE — Assessment & Plan Note (Signed)
The patient has very severe obstructive sleep apnea by his recent sleep study, with severe oxygen desaturation. I have reviewed the results with him in detail, and have recommended starting on CPAP while he works aggressively on weight loss. The patient is agreeable to this approach. I will set the patient up on cpap at a moderate pressure level to allow for desensitization, and will troubleshoot the device over the next 4-6weeks if needed.  The pt is to call me if having issues with tolerance.  Will then optimize the pressure once patient is able to wear cpap on a consistent basis.

## 2014-01-09 NOTE — Progress Notes (Signed)
   Subjective:    Patient ID: Steve Ashley, male    DOB: 03/08/1966, 48 y.o.   MRN: [id number]  HPI The patient comes in today for followup of his recent sleep study. To review, I saw the patient initially in 2014, and recommended a sleep study. Unfortunately, the patient never had this done, however he recently had a sleep study ordered by his primary care physician which showed very severe sleep apnea. He had an AHI of 75 events per hour with oxygen desaturation as low as 58%. I have reviewed the study with him in detail, and answered all of his questions.   Review of Systems  Constitutional: Negative for fever and unexpected weight change.  HENT: Negative for congestion, dental problem, ear pain, nosebleeds, postnasal drip, rhinorrhea, sinus pressure, sneezing, sore throat and trouble swallowing.   Eyes: Negative for redness and itching.  Respiratory: Negative for cough, chest tightness, shortness of breath and wheezing.   Cardiovascular: Negative for palpitations and leg swelling.  Gastrointestinal: Negative for nausea and vomiting.  Genitourinary: Negative for dysuria.  Musculoskeletal: Negative for joint swelling.  Skin: Negative for rash.  Allergic/Immunologic: Negative for environmental allergies.  Neurological: Negative for headaches.  Hematological: Does not bruise/bleed easily.  Psychiatric/Behavioral: Negative for dysphoric mood. The patient is not nervous/anxious.        Objective:   Physical Exam Morbidly obese male in no acute distress Nose without purulence or discharge noted Neck without lymphadenopathy or thyromegaly Lower extremities with mild edema, no cyanosis Alert and oriented, moves all 4 extremities.       Assessment & Plan:

## 2014-01-09 NOTE — Patient Instructions (Signed)
Will start you on cpap at a moderate pressure.  Please call if having issues with tolerance. Work on weight loss.  followup with me again in 8 weeks.

## 2014-01-28 ENCOUNTER — Telehealth: Payer: Self-pay | Admitting: Pulmonary Disease

## 2014-01-28 NOTE — Telephone Encounter (Signed)
Called [facility]. Synetta Fail advised me they have been trying to contact pt but have not been able to leave VM. Gave # above and [facility] will call him after 4 pm. Called pt and LMTCB x1

## 2014-01-29 NOTE — Telephone Encounter (Signed)
Spoke with patient-aware we gave number listed in phone note to [facility] and they will reach him after 4:00pm. Pt is aware to contact our office back if he does not hear from [facility].

## 2014-01-30 ENCOUNTER — Telehealth: Payer: Self-pay | Admitting: Pulmonary Disease

## 2014-01-30 NOTE — Telephone Encounter (Signed)
Called spoke with Synetta Fail from [facility] [phone]. She reports she will make sure the RT Greggory Stallion gives pt a call today after 4 PM. Called pt and LMTCB x1

## 2014-01-31 NOTE — Telephone Encounter (Signed)
Spoke with the pt He states that he spoke with [facility] and nothing further needed

## 2014-01-31 NOTE — Telephone Encounter (Signed)
Pt called back we can call him around 1:50 when he gets out of class [phone]

## 2014-01-31 NOTE — Telephone Encounter (Signed)
lmomtcb for the pt to make sure that [facility] followed up with him.

## 2014-03-10 ENCOUNTER — Ambulatory Visit: Payer: BC Managed Care – PPO | Admitting: Pulmonary Disease

## 2014-04-07 ENCOUNTER — Encounter: Payer: Self-pay | Admitting: Family Medicine

## 2014-04-07 ENCOUNTER — Ambulatory Visit (INDEPENDENT_AMBULATORY_CARE_PROVIDER_SITE_OTHER): Payer: BC Managed Care – PPO | Admitting: Family Medicine

## 2014-04-07 VITALS — BP 120/78 | HR 84 | Temp 98.2°F | Ht 71.0 in | Wt 340.5 lb

## 2014-04-07 DIAGNOSIS — M76822 Posterior tibial tendinitis, left leg: Secondary | ICD-10-CM

## 2014-04-07 DIAGNOSIS — M76821 Posterior tibial tendinitis, right leg: Secondary | ICD-10-CM

## 2014-04-07 DIAGNOSIS — E038 Other specified hypothyroidism: Secondary | ICD-10-CM

## 2014-04-07 LAB — TSH: TSH: 6.89 u[IU]/mL — ABNORMAL HIGH (ref 0.35–4.50)

## 2014-04-07 MED ORDER — DICLOFENAC SODIUM 75 MG PO TBEC: 75.0000 mg | DELAYED_RELEASE_TABLET | Freq: Two times a day (BID) | ORAL | Status: DC

## 2014-04-07 NOTE — Progress Notes (Signed)
Dr. Karleen HampshireSpencer T. Walta Bellville, MD, [facility] Primary Care and Sports Medicine [address] [place], [id number] Phone: [phone] Fax: [phone]  04/07/2014  Patient: Steve [name], MRN: [id number], DOB: 05/09/1966, 48 y.o.  Primary Physician:  Hannah BeatSpencer Zurri Rudden, MD  Chief Complaint: Foot Pain  Subjective:   Steve Ashley is a 48 y.o. very pleasant male patient who presents with the following:  Coaching football, MS and HS.   R > L heels, and pain first thing in the morning. Scared to put his feet on the floor. Rotates his shoes. Lost one pair of shoes.   PT tendinopathy  The patient describes this as foot pain, more on the RIGHT compared to the LEFT, but he is pointing almost directly at the posterior tibialis tendon on the RIGHT compared and compared to the LEFT, the RIGHT is more tender.  He has very flat feet, and he is having a difficult time going up on his toes.  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  He has not had any particular injury.  Past Medical History, Surgical History, Social History, Family History, Problem List, Medications, and Allergies have been reviewed and updated if relevant.  GEN: No fevers, chills. Nontoxic. Primarily MSK c/o today. MSK: Detailed in the HPI GI: tolerating PO intake without difficulty Neuro: No numbness, parasthesias, or tingling associated. Otherwise the pertinent positives of the ROS are noted above.   Objective:   BP 120/78 mmHg  Pulse 84  Temp(Src) 98.2 F (36.8 C) (Oral)  Ht 5\' 11"  (1.803 m)  Wt 340 lb 8 oz (154.45 kg)  BMI 47.51 kg/m2   GEN: WDWN, NAD, Non-toxic, Alert & Oriented x 3 HEENT: Atraumatic, Normocephalic.  Ears and Nose: No external deformity. EXTR: No clubbing/cyanosis/edema NEURO: Normal gait.  PSYCH: Normally interactive. Conversant. Not depressed or anxious appearing.  Calm demeanor.    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  Also bunionette formation.  Nontender in the forefoot.  Nontender at the malleoli.  Peroneal tendons are nontender.  Markedly tender at the posterior tibialis tendon on the RIGHT, and to a lesser degree on the LEFT.  Achilles is nontender and plantar fascia is nontender  Radiology: No results found.  Assessment and Plan:   Tibialis tendinitis, right  Other specified hypothyroidism - Plan: TSH  Tibialis tendinitis, left  Severe obesity (BMI >= 40)  Using an anatomical model, I reviewed with the patient the structures involved and how they related to their diagnosis. The patient indicated that they understood our discussion and the anatomy involved.   Ice massage 2-3 times a day Rehab as described in p/i, toe raises, pidgeon toed and walking pidgeon toed  The patient would benefit from an ASO ankle brace type to unload the PT tendon - give one.  Follow-up: No Follow-up on file.  New Prescriptions   DICLOFENAC (VOLTAREN) 75 MG EC TABLET    Take 1 tablet (75 mg total) by mouth 2 (two) times daily.   Orders Placed This Encounter  Procedures  . TSH    Signed,  Karleen HampshireSpencer T. Josephanthony Tindel, MD  Patient Instructions  Posterior Tib and arch rehab Begin with easy walking, heel, toe and backwards * Try to pick an easy location like a hallway or a room in your house and  do one of these each time that you go through this area.  Calf raises on a step - Pidgeon Toes, with toes turned inward Try to do most days of the week If pain persists at 3 sets of 30 - add backpack with 5 lbs Increase by 5 lbs per week to max of 30 lbs  Towel "Scrunch Ups" Use a hand towel or a moderate size towel Foot flat down on the towel Use toes to "scrunch up the towel" straight up and down, and going to the right and left.  3 sets of 20 * Can be done watching TV, reading, or  sitting and relaxing.      Patient's Medications  New Prescriptions   DICLOFENAC (VOLTAREN) 75 MG EC TABLET    Take 1 tablet (75 mg total) by mouth 2 (two) times daily.  Previous Medications   LEVOTHYROXINE (SYNTHROID, LEVOTHROID) 75 MCG TABLET    Take 1 tablet (75 mcg total) by mouth daily.  Modified Medications   No medications on file  Discontinued Medications   No medications on file

## 2014-04-07 NOTE — Progress Notes (Signed)
Pre visit review using our clinic review tool, if applicable. No additional management support is needed unless otherwise documented below in the visit note. 

## 2014-04-07 NOTE — Patient Instructions (Signed)
Posterior Tib and arch rehab Begin with easy walking, heel, toe and backwards * Try to pick an easy location like a hallway or a room in your house and do one of these each time that you go through this area.  Calf raises on a step - Pidgeon Toes, with toes turned inward Try to do most days of the week If pain persists at 3 sets of 30 - add backpack with 5 lbs Increase by 5 lbs per week to max of 30 lbs  Towel "Scrunch Ups" Use a hand towel or a moderate size towel Foot flat down on the towel Use toes to "scrunch up the towel" straight up and down, and going to the right and left.  3 sets of 20 * Can be done watching TV, reading, or sitting and relaxing.  

## 2014-04-15 ENCOUNTER — Encounter: Payer: Self-pay | Admitting: *Deleted

## 2014-04-15 MED ORDER — LEVOTHYROXINE SODIUM 88 MCG PO TABS: 88.0000 ug | ORAL_TABLET | Freq: Every day | ORAL | Status: DC

## 2014-04-15 NOTE — Addendum Note (Signed)
Addended by: Damita LackLORING, Jannetta Massey S on: 04/15/2014 02:46 PM   Modules accepted: Orders

## 2014-10-13 ENCOUNTER — Encounter: Payer: Self-pay | Admitting: Internal Medicine

## 2014-10-13 ENCOUNTER — Encounter: Payer: Self-pay | Admitting: Family Medicine

## 2014-10-13 ENCOUNTER — Ambulatory Visit (INDEPENDENT_AMBULATORY_CARE_PROVIDER_SITE_OTHER): Payer: BC Managed Care – PPO | Admitting: Family Medicine

## 2014-10-13 VITALS — BP 120/80 | HR 87 | Temp 98.4°F | Ht 70.5 in | Wt 329.5 lb

## 2014-10-13 DIAGNOSIS — Z0000 Encounter for general adult medical examination without abnormal findings: Secondary | ICD-10-CM

## 2014-10-13 DIAGNOSIS — Z125 Encounter for screening for malignant neoplasm of prostate: Secondary | ICD-10-CM

## 2014-10-13 DIAGNOSIS — E038 Other specified hypothyroidism: Secondary | ICD-10-CM

## 2014-10-13 DIAGNOSIS — Z1211 Encounter for screening for malignant neoplasm of colon: Secondary | ICD-10-CM

## 2014-10-13 DIAGNOSIS — R739 Hyperglycemia, unspecified: Secondary | ICD-10-CM

## 2014-10-13 DIAGNOSIS — Z79899 Other long term (current) drug therapy: Secondary | ICD-10-CM

## 2014-10-13 DIAGNOSIS — E785 Hyperlipidemia, unspecified: Secondary | ICD-10-CM

## 2014-10-13 NOTE — Progress Notes (Signed)
Pre visit review using our clinic review tool, if applicable. No additional management support is needed unless otherwise documented below in the visit note. 

## 2014-10-13 NOTE — Progress Notes (Signed)
Dr. Frederico Hamman T. Hale Chalfin, MD, [facility] Primary [facility] and Sports Medicine [place], [address] Phone: [phone] Fax: [phone]  10/13/2014  Patient: Steve Ashley, MRN: [id number], DOB: 24-Oct-1965, 49 y.o.  Primary Physician:  Owens Loffler, MD  Chief Complaint: Annual Exam  Subjective:   Steve Ashley is a 49 y.o. pleasant patient who presents with the following:  Preventative Health Maintenance Visit:  Health Maintenance Summary Reviewed and updated, unless pt declines services.  Tobacco History Reviewed. Alcohol: No concerns, no excessive use Exercise Habits: Some activity, rec at least 30 mins 5 times a week STD concerns: no risk or activity to increase risk Drug Use: None Encouraged self-testicular check  [employer] orth[employer]?  DJ a lot  Plan to lift and swim a lot over the summer.  Ride bikes around the track.   Health Maintenance  Topic Date Due  . HIV Screening  10/01/1979  . TETANUS/TDAP  10/01/1983  . COLONOSCOPY  10/01/2014  . INFLUENZA VACCINE  12/29/2014   There is no immunization history for the selected administration types on file for this patient. Patient Active Problem List   Diagnosis Date Noted  . Family history of prostate cancer 10/10/2013  . Severe obesity (BMI >= 40) 05/09/2013  . OSA (obstructive sleep apnea) 08/29/2012  . Hypothyroidism 01/06/2010  . HYPERLIPIDEMIA 01/06/2010  . HALLUX RIGIDUS 07/20/2009   Past Medical History  Diagnosis Date  . Syndesmotic ankle sprain yrs ago  . Ankle fracture yrs ago    left   . Morbid obesity   . Arthritis   . Hyperlipidemia   . Asthma      hx of childhood asthma, none current  . Morbid obesity 08/10/2012   Past Surgical History  Procedure Laterality Date  . Ankle surgery  age 56    left ankle   . Knee arthroscopy  yrs ago    right  . Thumb arthroscopy  age 28    left   . Ventral hernia repair  12/08/2011    Procedure: LAPAROSCOPIC VENTRAL HERNIA;   Surgeon: Gayland Curry, MD,FACS;  Location: [facility];  Service: General;  Laterality: N/A;   History   Social History  . Marital Status: Single    Spouse Name: N/A  . Number of Children: N/A  . Years of Education: N/A   Occupational History  . PE teacher    Social History Main Topics  . Smoking status: Never Smoker   . Smokeless tobacco: Never Used  . Alcohol Use: Yes     Comment: occassional  . Drug Use: No  . Sexual Activity: Not on file   Other Topics Concern  . Not on file   Social History Narrative   Family History  Problem Relation Age of Onset  . Hypertension Mother   . Heart disease Neg Hx   . Cancer Father     prostate   Allergies  Allergen Reactions  . Latex Itching    Medication list has been reviewed and updated.   General: Denies fever, chills, sweats. No significant weight loss. Eyes: Denies blurring,significant itching ENT: Denies earache, sore throat, and hoarseness. Cardiovascular: Denies chest pains, palpitations, dyspnea on exertion Respiratory: Denies cough, dyspnea at rest,wheeezing Breast: no concerns about lumps GI: Denies nausea, vomiting, diarrhea, constipation, change in bowel habits, abdominal pain, melena, hematochezia GU: Denies penile discharge, ED, urinary flow / outflow problems. No STD concerns. Musculoskeletal: Denies back pain, joint pain, ONGOING PT TENDINOPATHY Derm: Denies rash,  itching Neuro: Denies  paresthesias, frequent falls, frequent headaches Psych: Denies depression, anxiety Endocrine: Denies cold intolerance, heat intolerance, polydipsia Heme: Denies enlarged lymph nodes Allergy: No hayfever  Objective:   BP 120/80 mmHg  Pulse 87  Temp(Src) 98.4 F (36.9 C) (Oral)  Ht 5' 10.5" (1.791 m)  Wt 329 lb 8 oz (149.46 kg)  BMI 46.59 kg/m2 Ideal Body Weight: Weight in (lb) to have BMI = 25: 176.4  No exam data present  GEN: well developed, well nourished, no acute distress Eyes: conjunctiva and lids normal,  PERRLA, EOMI ENT: TM clear, nares clear, oral exam WNL Neck: supple, no lymphadenopathy, no thyromegaly, no JVD Pulm: clear to auscultation and percussion, respiratory effort normal CV: regular rate and rhythm, S1-S2, no murmur, rub or gallop, no bruits, peripheral pulses normal and symmetric, no cyanosis, clubbing, edema or varicosities GI: soft, non-tender; no hepatosplenomegaly, masses; active bowel sounds all quadrants GU: defer Lymph: no cervical, axillary or inguinal adenopathy MSK: gait normal, muscle tone and strength WNL. Notable bilateral posterior tibialis tendinopathy.  He has very significant for breakdown and pes planus bilaterally. SKIN: clear, good turgor, color WNL, no rashes, lesions, or ulcerations Neuro: normal mental status, normal strength, sensation, and motion Psych: alert; oriented to person, place and time, normally interactive and not anxious or depressed in appearance.  All labs reviewed with patient. (2016 labs pending)  Lipids:    Component Value Date/Time   CHOL 235* 10/10/2013 0921   TRIG 109.0 10/10/2013 0921   HDL 41.70 10/10/2013 0921   LDLDIRECT 170.9 08/01/2012 1030   VLDL 21.8 10/10/2013 0921   CHOLHDL 6 10/10/2013 0921   CBC: CBC Latest Ref Rng 10/10/2013 08/01/2012 12/05/2011  WBC 4.0 - 10.5 K/uL 7.7 8.7 7.4  Hemoglobin 13.0 - 17.0 g/dL 14.7 14.5 15.0  Hematocrit 39.0 - 52.0 % 44.8 44.2 45.5  Platelets 150.0 - 400.0 K/uL 277.0 289.0 704    Basic Metabolic Panel:    Component Value Date/Time   NA 139 10/10/2013 0921   K 4.3 10/10/2013 0921   CL 105 10/10/2013 0921   CO2 28 10/10/2013 0921   BUN 13 10/10/2013 0921   CREATININE 1.0 10/10/2013 0921   GLUCOSE 83 10/10/2013 0921   CALCIUM 9.4 10/10/2013 0921   Hepatic Function Latest Ref Rng 10/10/2013 08/01/2012 12/05/2011  Total Protein 6.0 - 8.3 g/dL 7.4 7.5 8.1  Albumin 3.5 - 5.2 g/dL 4.0 4.0 4.2  AST 0 - 37 U/L 22 18 18   ALT 0 - 53 U/L 21 23 18   Alk Phosphatase 39 - 117 U/L 63 61 73    Total Bilirubin 0.2 - 1.2 mg/dL 1.2 0.7 0.6  Bilirubin, Direct 0.0 - 0.3 mg/dL - 0.1 -    Lab Results  Component Value Date   TSH 6.89* 04/07/2014   Lab Results  Component Value Date   PSA 0.51 10/10/2013   PSA 0.46 08/01/2012   PSA 0.38 01/04/2010    Assessment and Plan:   Healthcare maintenance  Special screening for malignant neoplasms, colon - Plan: Ambulatory referral to Gastroenterology  Screening for prostate cancer - Plan: PSA  Hyperlipidemia - Plan: LDL cholesterol, direct  Other specified hypothyroidism - Plan: TSH  Hyperglycemia - Plan: Hemoglobin A1c  Encounter for long-term (current) use of medications - Plan: Basic metabolic panel, CBC with Differential/Platelet, Hepatic function panel  Health Maintenance Exam: The patient's preventative maintenance and recommended screening tests for an annual wellness exam were reviewed in full today. Brought up to date unless services  declined.  Counselled on the importance of diet, exercise, and its role in overall health and mortality. The patient's FH and SH was reviewed, including their home life, tobacco status, and drug and alcohol status.  Weight loss is still primary goal that he will work towards this year, diet and exercise.   Follow-up: Return in 1 year (on 10/13/2015). Unless noted, follow-up in 1 year for Health Maintenance Exam.  New Prescriptions   No medications on file   Orders Placed This Encounter  Procedures  . TSH  . Basic metabolic panel  . CBC with Differential/Platelet  . Hepatic function panel  . PSA  . LDL cholesterol, direct  . Hemoglobin A1c  . Ambulatory referral to Gastroenterology    Signed,  Frederico Hamman T. Yovani Cogburn, MD   Patient's Medications  New Prescriptions   No medications on file  Previous Medications   LEVOTHYROXINE (SYNTHROID, LEVOTHROID) 88 MCG TABLET    Take 1 tablet (88 mcg total) by mouth daily.  Modified Medications   No medications on file  Discontinued  Medications   DICLOFENAC (VOLTAREN) 75 MG EC TABLET    Take 1 tablet (75 mg total) by mouth 2 (two) times daily.

## 2014-10-13 NOTE — Patient Instructions (Signed)

## 2014-10-14 ENCOUNTER — Encounter: Payer: Self-pay | Admitting: Family Medicine

## 2014-10-14 LAB — CBC WITH DIFFERENTIAL/PLATELET
Basophils Absolute: 0 10*3/uL (ref 0.0–0.1)
Basophils Relative: 0.6 % (ref 0.0–3.0)
Eosinophils Absolute: 0.3 10*3/uL (ref 0.0–0.7)
Eosinophils Relative: 3.7 % (ref 0.0–5.0)
HCT: 44.8 % (ref 39.0–52.0)
Hemoglobin: 14.7 g/dL (ref 13.0–17.0)
Lymphocytes Relative: 29.5 % (ref 12.0–46.0)
Lymphs Abs: 2.4 10*3/uL (ref 0.7–4.0)
MCHC: 32.7 g/dL (ref 30.0–36.0)
MCV: 84.8 fl (ref 78.0–100.0)
Monocytes Absolute: 0.8 10*3/uL (ref 0.1–1.0)
Monocytes Relative: 10.6 % (ref 3.0–12.0)
Neutro Abs: 4.5 10*3/uL (ref 1.4–7.7)
Neutrophils Relative %: 55.6 % (ref 43.0–77.0)
Platelets: 300 10*3/uL (ref 150.0–400.0)
RBC: 5.28 Mil/uL (ref 4.22–5.81)
RDW: 15.5 % (ref 11.5–15.5)
WBC: 8 10*3/uL (ref 4.0–10.5)

## 2014-10-14 LAB — HEPATIC FUNCTION PANEL
ALT: 21 U/L (ref 0–53)
AST: 19 U/L (ref 0–37)
Albumin: 4.3 g/dL (ref 3.5–5.2)
Alkaline Phosphatase: 70 U/L (ref 39–117)
Bilirubin, Direct: 0.1 mg/dL (ref 0.0–0.3)
Total Bilirubin: 0.4 mg/dL (ref 0.2–1.2)
Total Protein: 7.5 g/dL (ref 6.0–8.3)

## 2014-10-14 LAB — BASIC METABOLIC PANEL
BUN: 13 mg/dL (ref 6–23)
CO2: 30 mEq/L (ref 19–32)
Calcium: 10.1 mg/dL (ref 8.4–10.5)
Chloride: 103 mEq/L (ref 96–112)
Creatinine, Ser: 1.13 mg/dL (ref 0.40–1.50)
GFR: 88.33 mL/min (ref >60.00)
Glucose, Bld: 81 mg/dL (ref 70–99)
Potassium: 4.4 mEq/L (ref 3.5–5.1)
Sodium: 140 mEq/L (ref 135–145)

## 2014-10-14 LAB — PSA: PSA: 0.45 ng/mL (ref 0.10–4.00)

## 2014-10-14 LAB — LDL CHOLESTEROL, DIRECT: Direct LDL: 175 mg/dL

## 2014-10-14 LAB — HEMOGLOBIN A1C: Hgb A1c MFr Bld: 5.8 % (ref 4.6–6.5)

## 2014-10-14 LAB — TSH: TSH: 6.86 u[IU]/mL — ABNORMAL HIGH (ref 0.35–4.50)

## 2014-10-14 NOTE — Addendum Note (Signed)
Addended by: Alvina ChouWALSH, Casi Westerfeld J on: 10/14/2014 11:24 AM   Modules accepted: Orders

## 2014-11-05 ENCOUNTER — Other Ambulatory Visit: Payer: Self-pay | Admitting: Family Medicine

## 2014-11-05 DIAGNOSIS — E039 Hypothyroidism, unspecified: Secondary | ICD-10-CM

## 2014-12-04 ENCOUNTER — Telehealth: Payer: Self-pay | Admitting: *Deleted

## 2014-12-04 NOTE — Telephone Encounter (Signed)
Attempted to call home, work and cell. LM on cell phone regarding missed previsit appointment that colonoscopy cancelled. Advised that he could call back 1st thing in the morning to see if appointment still available if that suited his schedule. Both colonoscopy and previsit need to be scheduled. No show letter sent.

## 2014-12-08 ENCOUNTER — Other Ambulatory Visit: Payer: Self-pay | Admitting: Family Medicine

## 2014-12-09 ENCOUNTER — Ambulatory Visit (AMBULATORY_SURGERY_CENTER): Payer: Self-pay

## 2014-12-09 VITALS — Ht 71.0 in | Wt 334.8 lb

## 2014-12-09 DIAGNOSIS — Z1211 Encounter for screening for malignant neoplasm of colon: Secondary | ICD-10-CM

## 2014-12-09 MED ORDER — SUPREP BOWEL PREP KIT 17.5-3.13-1.6 GM/177ML PO SOLN: 1.0000 | Freq: Once | ORAL | Status: DC

## 2014-12-09 NOTE — Progress Notes (Signed)
No allergies to eggs or soy No diet/weight loss meds No home oxygen No past problems with anesthesia  Does not want emmi video 

## 2014-12-18 ENCOUNTER — Encounter: Payer: Self-pay | Admitting: Internal Medicine

## 2014-12-18 ENCOUNTER — Ambulatory Visit (AMBULATORY_SURGERY_CENTER): Payer: BC Managed Care – PPO | Admitting: Internal Medicine

## 2014-12-18 ENCOUNTER — Encounter: Payer: BC Managed Care – PPO | Admitting: Internal Medicine

## 2014-12-18 VITALS — BP 147/81 | HR 71 | Temp 97.2°F | Resp 28 | Ht 71.0 in | Wt 334.0 lb

## 2014-12-18 DIAGNOSIS — Z1211 Encounter for screening for malignant neoplasm of colon: Secondary | ICD-10-CM

## 2014-12-18 MED ORDER — SODIUM CHLORIDE 0.9 % IV SOLN: 500.0000 mL | INTRAVENOUS | Status: DC

## 2014-12-18 NOTE — Progress Notes (Signed)
A/ox3 pleased with MAC, report to Suzanne RN 

## 2014-12-18 NOTE — Op Note (Signed)
[facility] [address] [place], [address]   COLONOSCOPY PROCEDURE REPORT  PATIENT: Steve, Ashley  MR#: [id number] BIRTHDATE: [birth year] , 49  yrs. old GENDER: male ENDOSCOPIST: Beverley Fiedler, MD REFERRED XB:[id number] Ward Chatters, M.D. PROCEDURE DATE:  12/18/2014 PROCEDURE:   Colonoscopy, screening First Screening Colonoscopy - Avg.  risk and is 50 yrs.  old or older Yes.  Prior Negative Screening - Now for repeat screening. N/A  History of Adenoma - Now for follow-up colonoscopy & has been > or = to 3 yrs.  N/A  Polyps removed today? No Recommend repeat exam, <10 yrs? No ASA CLASS:   Class III INDICATIONS:Screening for colonic neoplasia and Colorectal Neoplasm Risk Assessment for this procedure is average risk. MEDICATIONS: Monitored anesthesia care and Propofol 370 mg IV  DESCRIPTION OF PROCEDURE:   After the risks benefits and alternatives of the procedure were thoroughly explained, informed consent was obtained.  The digital rectal exam revealed no rectal mass.   The LB [id number]  endoscope was introduced through the anus and advanced to the cecum, which was identified by both the appendix and ileocecal valve. No adverse events experienced. The quality of the prep was good.  (Suprep was used)  The instrument was then slowly withdrawn as the colon was fully examined. Estimated blood loss is zero unless otherwise noted in this procedure report.     COLON FINDINGS: There was mild diverticulosis noted in the left colon.   The examination was otherwise normal.  No polyps or tumors seen.  Retroflexed views revealed external hemorrhoids. The time to cecum = 5.1 Withdrawal time = 13.9   The scope was withdrawn and the procedure completed.  COMPLICATIONS: There were no immediate complications.  ENDOSCOPIC IMPRESSION: 1.   Mild diverticulosis was noted in the left colon 2.   The examination was otherwise normal  RECOMMENDATIONS: You should  continue to follow colorectal cancer screening guidelines for "routine risk" patients with a repeat colonoscopy in 10 years. There is no need for FOBT (stool) testing for at least 5 years.  eSigned:  Beverley Fiedler, MD 12/18/2014 12:28 PM   cc: Juleen China, MD and The Patient

## 2014-12-18 NOTE — Patient Instructions (Signed)
YOU HAD AN ENDOSCOPIC PROCEDURE TODAY AT THE [facility]:   Refer to the procedure report that was given to you for any specific questions about what was found during the examination.  If the procedure report does not answer your questions, please call your gastroenterologist to clarify.  If you requested that your care partner not be given the details of your procedure findings, then the procedure report has been included in a sealed envelope for you to review at your convenience later.  YOU SHOULD EXPECT: Some feelings of bloating in the abdomen. Passage of more gas than usual.  Walking can help get rid of the air that was put into your GI tract during the procedure and reduce the bloating. If you had a lower endoscopy (such as a colonoscopy or flexible sigmoidoscopy) you may notice spotting of blood in your stool or on the toilet paper. If you underwent a bowel prep for your procedure, you may not have a normal bowel movement for a few days.  Please Note:  You might notice some irritation and congestion in your nose or some drainage.  This is from the oxygen used during your procedure.  There is no need for concern and it should clear up in a day or so.  SYMPTOMS TO REPORT IMMEDIATELY:   Following lower endoscopy (colonoscopy or flexible sigmoidoscopy):  Excessive amounts of blood in the stool  Significant tenderness or worsening of abdominal pains  Swelling of the abdomen that is new, acute  Fever of 100F or higher   For urgent or emergent issues, a gastroenterologist can be reached at any hour by calling [phone].   DIET: Your first meal following the procedure should be a small meal and then it is ok to progress to your normal diet. Heavy or fried foods are harder to digest and may make you feel nauseous or bloated.  Likewise, meals heavy in dairy and vegetables can increase bloating.  Drink plenty of fluids but you should avoid alcoholic beverages for 24 hours. Increase  the fiber in your diet.  ACTIVITY:  You should plan to take it easy for the rest of today and you should NOT DRIVE or use heavy machinery until tomorrow (because of the sedation medicines used during the test).    FOLLOW UP: Our staff will call the number listed on your records the next business day following your procedure to check on you and address any questions or concerns that you may have regarding the information given to you following your procedure. If we do not reach you, we will leave a message.  However, if you are feeling well and you are not experiencing any problems, there is no need to return our call.  We will assume that you have returned to your regular daily activities without incident.  If any biopsies were taken you will be contacted by phone or by letter within the next 1-3 weeks.  Please call us at [phone] if you have not heard about the biopsies in 3 weeks.    SIGNATURES/CONFIDENTIALITY: You and/or your care partner have signed paperwork which will be entered into your electronic medical record.  These signatures attest to the fact that that the information above on your After Visit Summary has been reviewed and is understood.  Full responsibility of the confidentiality of this discharge information lies with you and/or your care-partner.  Read all of the information given to you by your recovery room nurse.

## 2014-12-19 ENCOUNTER — Telehealth: Payer: Self-pay | Admitting: *Deleted

## 2014-12-19 NOTE — Telephone Encounter (Signed)
  Follow up Call-  Call back number 12/18/2014  Post procedure Call Back phone  # [phone]  Permission to leave phone message Yes     Patient questions:  Do you have a fever, pain , or abdominal swelling? No. Pain Score  0 *  Have you tolerated food without any problems? Yes.    Have you been able to return to your normal activities? Yes.    Do you have any questions about your discharge instructions: Diet   No. Medications  No. Follow up visit  No.  Do you have questions or concerns about your Care? No.  Actions: * If pain score is 4 or above: No action needed, pain <4.

## 2015-04-30 ENCOUNTER — Ambulatory Visit (INDEPENDENT_AMBULATORY_CARE_PROVIDER_SITE_OTHER): Payer: BC Managed Care – PPO | Admitting: Family Medicine

## 2015-04-30 ENCOUNTER — Ambulatory Visit (INDEPENDENT_AMBULATORY_CARE_PROVIDER_SITE_OTHER)
Admission: RE | Admit: 2015-04-30 | Discharge: 2015-04-30 | Disposition: A | Discharge status: Home / Self Care | Payer: BC Managed Care – PPO | Source: Ambulatory Visit | Attending: Family Medicine | Admitting: Family Medicine

## 2015-04-30 ENCOUNTER — Encounter: Payer: Self-pay | Admitting: Family Medicine

## 2015-04-30 ENCOUNTER — Encounter (INDEPENDENT_AMBULATORY_CARE_PROVIDER_SITE_OTHER): Payer: Self-pay

## 2015-04-30 VITALS — BP 138/86 | HR 85 | Temp 98.7°F | Ht 70.5 in | Wt 348.0 lb

## 2015-04-30 DIAGNOSIS — R05 Cough: Secondary | ICD-10-CM

## 2015-04-30 DIAGNOSIS — R059 Cough, unspecified: Secondary | ICD-10-CM

## 2015-04-30 DIAGNOSIS — R042 Hemoptysis: Secondary | ICD-10-CM

## 2015-04-30 MED ORDER — HYDROCODONE-HOMATROPINE 5-1.5 MG/5ML PO SYRP: 5.0000 mL | ORAL_SOLUTION | Freq: Every evening | ORAL | Status: DC | PRN

## 2015-04-30 MED ORDER — AZITHROMYCIN 250 MG PO TABS: ORAL_TABLET | ORAL | Status: DC

## 2015-04-30 NOTE — Assessment & Plan Note (Signed)
CXR clear.. Likely due to throat irritation from coughing.

## 2015-04-30 NOTE — Patient Instructions (Addendum)
Cough suppressant at night. Mucinex DM during the day.  Complete antibiotics.  Go to ER for severe shortness of breath.

## 2015-04-30 NOTE — Assessment & Plan Note (Signed)
Given worsening after 7-10 days.. Treat with antibitoics. Cough suppressant as well.

## 2015-04-30 NOTE — Progress Notes (Signed)
   Subjective:    Patient ID: Steve Ashley, male    DOB: 06/19/1965, 49 y.o.   MRN: [id number]  Cough This is a new problem. The current episode started in the past 7 days (week ago). The problem has been gradually worsening. The problem occurs constantly. The cough is productive of blood-tinged sputum. Associated symptoms include hemoptysis, myalgias, nasal congestion, a sore throat and shortness of breath. Pertinent negatives include no ear congestion, ear pain, fever, postnasal drip or wheezing. Associated symptoms comments: Night sweats. The symptoms are aggravated by lying down. Risk factors: nonsmoker. Treatments tried: theraflu. The treatment provided moderate relief. His past medical history is significant for asthma. There is no history of COPD or environmental allergies. childhood asthma    Social History /Family History/Past Medical History reviewed and updated if needed.  OSA  Review of Systems  Constitutional: Negative for fever.  HENT: Positive for sore throat. Negative for ear pain and postnasal drip.   Respiratory: Positive for cough, hemoptysis and shortness of breath. Negative for wheezing.   Musculoskeletal: Positive for myalgias.  Allergic/Immunologic: Negative for environmental allergies.       Objective:   Physical Exam  Constitutional: Vital signs are normal. He appears well-developed and well-nourished.  Non-toxic appearance. He does not appear ill. No distress.  HENT:  Head: Normocephalic and atraumatic.  Right Ear: Hearing, tympanic membrane, external ear and ear canal normal. No tenderness. No foreign bodies. Tympanic membrane is not retracted and not bulging.  Left Ear: Hearing, tympanic membrane, external ear and ear canal normal. No tenderness. No foreign bodies. Tympanic membrane is not retracted and not bulging.  Nose: Nose normal. No mucosal edema or rhinorrhea. Right sinus exhibits no maxillary sinus tenderness and no frontal sinus tenderness. Left  sinus exhibits no maxillary sinus tenderness and no frontal sinus tenderness.  Mouth/Throat: Uvula is midline, oropharynx is clear and moist and mucous membranes are normal. Normal dentition. No dental caries. No oropharyngeal exudate or tonsillar abscesses.  Eyes: Conjunctivae, EOM and lids are normal. Pupils are equal, round, and reactive to light. Lids are everted and swept, no foreign bodies found.  Neck: Trachea normal, normal range of motion and phonation normal. Neck supple. Carotid bruit is not present. No thyroid mass and no thyromegaly present.  Cardiovascular: Normal rate, regular rhythm, S1 normal, S2 normal, normal heart sounds, intact distal pulses and normal pulses.  Exam reveals no gallop.   No murmur heard. Pulmonary/Chest: Effort normal. No respiratory distress. He has no wheezes. He has rhonchi. He has no rales.  Cannot stop coughing for deep breath exam.  Abdominal: Soft. Normal appearance and bowel sounds are normal. There is no hepatosplenomegaly. There is no tenderness. There is no rebound, no guarding and no CVA tenderness. No hernia.  Neurological: He is alert. He has normal reflexes.  Skin: Skin is warm, dry and intact. No rash noted.  Psychiatric: He has a normal mood and affect. His speech is normal and behavior is normal. Judgment normal.          Assessment & Plan:

## 2015-04-30 NOTE — Progress Notes (Signed)
Pre visit review using our clinic review tool, if applicable. No additional management support is needed unless otherwise documented below in the visit note. 

## 2015-10-12 ENCOUNTER — Other Ambulatory Visit: Payer: Self-pay | Admitting: Family Medicine

## 2015-10-12 DIAGNOSIS — Z114 Encounter for screening for human immunodeficiency virus [HIV]: Secondary | ICD-10-CM

## 2015-10-12 DIAGNOSIS — Z13220 Encounter for screening for lipoid disorders: Secondary | ICD-10-CM

## 2015-10-12 DIAGNOSIS — Z79899 Other long term (current) drug therapy: Secondary | ICD-10-CM

## 2015-10-12 DIAGNOSIS — E039 Hypothyroidism, unspecified: Secondary | ICD-10-CM

## 2015-10-12 DIAGNOSIS — Z125 Encounter for screening for malignant neoplasm of prostate: Secondary | ICD-10-CM

## 2015-10-12 DIAGNOSIS — I519 Heart disease, unspecified: Secondary | ICD-10-CM

## 2015-10-14 ENCOUNTER — Other Ambulatory Visit (INDEPENDENT_AMBULATORY_CARE_PROVIDER_SITE_OTHER): Payer: BC Managed Care – PPO

## 2015-10-14 DIAGNOSIS — Z125 Encounter for screening for malignant neoplasm of prostate: Secondary | ICD-10-CM

## 2015-10-14 DIAGNOSIS — Z79899 Other long term (current) drug therapy: Secondary | ICD-10-CM

## 2015-10-14 DIAGNOSIS — E039 Hypothyroidism, unspecified: Secondary | ICD-10-CM | POA: Diagnosis not present

## 2015-10-14 DIAGNOSIS — Z13220 Encounter for screening for lipoid disorders: Secondary | ICD-10-CM

## 2015-10-14 DIAGNOSIS — Z114 Encounter for screening for human immunodeficiency virus [HIV]: Secondary | ICD-10-CM

## 2015-10-14 DIAGNOSIS — R7989 Other specified abnormal findings of blood chemistry: Secondary | ICD-10-CM

## 2015-10-14 LAB — T4, FREE: Free T4: 0.57 ng/dL — ABNORMAL LOW (ref 0.60–1.60)

## 2015-10-14 LAB — CBC WITH DIFFERENTIAL/PLATELET
Basophils Absolute: 0 10*3/uL (ref 0.0–0.1)
Basophils Relative: 0.6 % (ref 0.0–3.0)
Eosinophils Absolute: 0.4 10*3/uL (ref 0.0–0.7)
Eosinophils Relative: 4.5 % (ref 0.0–5.0)
HCT: 44.5 % (ref 39.0–52.0)
Hemoglobin: 14.5 g/dL (ref 13.0–17.0)
Lymphocytes Relative: 30.1 % (ref 12.0–46.0)
Lymphs Abs: 2.5 10*3/uL (ref 0.7–4.0)
MCHC: 32.7 g/dL (ref 30.0–36.0)
MCV: 85.3 fl (ref 78.0–100.0)
Monocytes Absolute: 0.7 10*3/uL (ref 0.1–1.0)
Monocytes Relative: 9 % (ref 3.0–12.0)
Neutro Abs: 4.6 10*3/uL (ref 1.4–7.7)
Neutrophils Relative %: 55.8 % (ref 43.0–77.0)
Platelets: 255 10*3/uL (ref 150.0–400.0)
RBC: 5.21 Mil/uL (ref 4.22–5.81)
RDW: 15 % (ref 11.5–15.5)
WBC: 8.2 10*3/uL (ref 4.0–10.5)

## 2015-10-14 LAB — HEPATIC FUNCTION PANEL
ALT: 20 U/L (ref 0–53)
AST: 18 U/L (ref 0–37)
Albumin: 4.3 g/dL (ref 3.5–5.2)
Alkaline Phosphatase: 68 U/L (ref 39–117)
Bilirubin, Direct: 0.1 mg/dL (ref 0.0–0.3)
Total Bilirubin: 0.5 mg/dL (ref 0.2–1.2)
Total Protein: 7.4 g/dL (ref 6.0–8.3)

## 2015-10-14 LAB — T3, FREE: T3, Free: 3.7 pg/mL (ref 2.3–4.2)

## 2015-10-14 LAB — BASIC METABOLIC PANEL
BUN: 11 mg/dL (ref 6–23)
CO2: 28 mEq/L (ref 19–32)
Calcium: 9.7 mg/dL (ref 8.4–10.5)
Chloride: 104 mEq/L (ref 96–112)
Creatinine, Ser: 1.02 mg/dL (ref 0.40–1.50)
GFR: 99.01 mL/min (ref >60.00)
Glucose, Bld: 122 mg/dL — ABNORMAL HIGH (ref 70–99)
Potassium: 3.8 mEq/L (ref 3.5–5.1)
Sodium: 141 mEq/L (ref 135–145)

## 2015-10-14 LAB — TSH: TSH: 6.85 u[IU]/mL — ABNORMAL HIGH (ref 0.35–4.50)

## 2015-10-14 LAB — LIPID PANEL
Cholesterol: 248 mg/dL — ABNORMAL HIGH (ref 0–200)
HDL: 37.1 mg/dL — ABNORMAL LOW (ref >39.00)
NonHDL: 210.5
Total CHOL/HDL Ratio: 7
Triglycerides: 217 mg/dL — ABNORMAL HIGH (ref 0.0–149.0)
VLDL: 43.4 mg/dL — ABNORMAL HIGH (ref 0.0–40.0)

## 2015-10-14 LAB — PSA: PSA: 0.42 ng/mL (ref 0.10–4.00)

## 2015-10-14 LAB — LDL CHOLESTEROL, DIRECT: Direct LDL: 175 mg/dL

## 2015-10-15 ENCOUNTER — Encounter: Payer: Self-pay | Admitting: *Deleted

## 2015-10-15 ENCOUNTER — Ambulatory Visit (INDEPENDENT_AMBULATORY_CARE_PROVIDER_SITE_OTHER): Payer: BC Managed Care – PPO | Admitting: Family Medicine

## 2015-10-15 ENCOUNTER — Encounter: Payer: Self-pay | Admitting: Family Medicine

## 2015-10-15 ENCOUNTER — Ambulatory Visit (INDEPENDENT_AMBULATORY_CARE_PROVIDER_SITE_OTHER)
Admission: RE | Admit: 2015-10-15 | Discharge: 2015-10-15 | Disposition: A | Discharge status: Home / Self Care | Payer: BC Managed Care – PPO | Source: Ambulatory Visit | Attending: Family Medicine | Admitting: Family Medicine

## 2015-10-15 VITALS — BP 120/80 | HR 93 | Temp 98.3°F | Ht 70.5 in | Wt 353.0 lb

## 2015-10-15 DIAGNOSIS — M79674 Pain in right toe(s): Secondary | ICD-10-CM | POA: Diagnosis not present

## 2015-10-15 DIAGNOSIS — W2103XS Struck by baseball, sequela: Secondary | ICD-10-CM

## 2015-10-15 DIAGNOSIS — M79671 Pain in right foot: Secondary | ICD-10-CM

## 2015-10-15 LAB — HIV ANTIBODY (ROUTINE TESTING W REFLEX): HIV 1&2 Ab, 4th Generation: NONREACTIVE

## 2015-10-15 MED ORDER — DICLOFENAC SODIUM 75 MG PO TBEC: 75.0000 mg | DELAYED_RELEASE_TABLET | Freq: Two times a day (BID) | ORAL | Status: DC

## 2015-10-15 NOTE — Progress Notes (Signed)
   Subjective:    Patient ID: Steve Ashley, male    DOB: 11/09/1965, 50 y.o.   MRN: [id number]  HPI   50 year old male with severe obesity presents with new onset baseball injury to right great toe.  He reports that  2 days ago,he had a baseball direct impact to tip of right great toe when he was pitching.  Unable to weight bear immediately, pain and swelling immediate.  Iced that day. Some decrease in swelling now. Painful weight bearing and pt limping.  He has used 1-2 tabs  ibuprofen with out any relief.  He has been elevating foot at night.   Social History /Family History/Past Medical History reviewed and updated if needed. No past foot injuries.  Review of Systems  Constitutional: Negative for fatigue.  HENT: Negative for ear pain.   Eyes: Negative for pain.  Respiratory: Negative for shortness of breath.   Cardiovascular: Negative for chest pain.       Objective:   Physical Exam  Constitutional: Vital signs are normal. He appears well-developed and well-nourished.  HENT:  Head: Normocephalic.  Right Ear: Hearing normal.  Left Ear: Hearing normal.  Nose: Nose normal.  Mouth/Throat: Oropharynx is clear and moist and mucous membranes are normal.  Neck: Trachea normal. Carotid bruit is not present. No thyroid mass and no thyromegaly present.  Cardiovascular: Normal rate, regular rhythm and normal pulses.  Exam reveals no gallop, no distant heart sounds and no friction rub.   No murmur heard. No peripheral edema  Pulmonary/Chest: Effort normal and breath sounds normal. No respiratory distress.  Musculoskeletal:       Right ankle: Normal. He exhibits normal range of motion and no swelling. No tenderness.       Right foot: There is tenderness, bony tenderness and swelling.       Feet:  Area of pain  and swelling  No bruising  Skin: Skin is warm, dry and intact. No rash noted.  Psychiatric: He has a normal mood and affect. His speech is normal and behavior is  normal. Thought content normal.          Assessment & Plan:   Pain in right great toe and dorsal foot: No clear fracture seen at this time on X-ray. Will await radiologist's over-reading.  Treat with ice, elevation, limit weight bearing with use of crutch hes. Start NSAID for pain and inflammation. Wear post op shoe. Keep follow up with PCP/sports medication for further management of injury as planned on Monday.

## 2015-10-15 NOTE — Patient Instructions (Addendum)
Elevate, ice. Start anti-inflammatory for pain and swelling.  Wear post op shoe and use crutches to limit weight bearing.  We will call with radiologist's over reading of X-rays.  Keep follow up with Dr. Patsy Lageropland as scheduled.

## 2015-10-19 ENCOUNTER — Ambulatory Visit (INDEPENDENT_AMBULATORY_CARE_PROVIDER_SITE_OTHER): Payer: BC Managed Care – PPO | Admitting: Family Medicine

## 2015-10-19 ENCOUNTER — Encounter: Payer: Self-pay | Admitting: Family Medicine

## 2015-10-19 VITALS — BP 116/80 | HR 86 | Temp 98.3°F | Ht 70.5 in | Wt 351.2 lb

## 2015-10-19 DIAGNOSIS — Z23 Encounter for immunization: Secondary | ICD-10-CM

## 2015-10-19 DIAGNOSIS — Z0000 Encounter for general adult medical examination without abnormal findings: Secondary | ICD-10-CM | POA: Diagnosis not present

## 2015-10-19 MED ORDER — LEVOTHYROXINE SODIUM 75 MCG PO TABS: 75.0000 ug | ORAL_TABLET | Freq: Every day | ORAL | Status: DC

## 2015-10-19 NOTE — Progress Notes (Signed)
Dr. Frederico Hamman T. [name], MD, [facility] [place], [address] Phone: [phone] Fax: [phone]  10/19/2015  Patient: Steve Ashley, MRN: [id number], DOB: 05-30-1966, 50 y.o.  Primary Physician:  Owens Loffler, MD   Chief Complaint  Patient presents with  . Annual Exam   Subjective:   Steve Ashley is a 50 y.o. pleasant patient who presents with the following:  Preventative Health Maintenance Visit:  Health Maintenance Summary Reviewed and updated, unless pt declines services.  Tobacco History Reviewed. Alcohol: No concerns, no excessive use Exercise Habits: Some activity, rec at least 30 mins 5 times a week - some walking and biking STD concerns: no risk or activity to increase risk Drug Use: None Encouraged self-testicular check  R MTP with small avulsion fx  Health Maintenance  Topic Date Due  . INFLUENZA VACCINE  12/29/2015  . COLONOSCOPY  12/17/2024  . TETANUS/TDAP  10/18/2025  . HIV Screening  Completed   Immunization History  Administered Date(s) Administered  . Tdap 10/19/2015   Patient Active Problem List   Diagnosis Date Noted  . Right foot pain 10/15/2015  . Pain of right great toe 10/15/2015  . Hemoptysis 04/30/2015  . Family history of prostate cancer 10/10/2013  . Severe obesity (BMI >= 40) ([facility]) 05/09/2013  . OSA (obstructive sleep apnea) 08/29/2012  . Hypothyroidism 01/06/2010  . HYPERLIPIDEMIA 01/06/2010  . HALLUX RIGIDUS 07/20/2009   Past Medical History  Diagnosis Date  . Syndesmotic ankle sprain yrs ago  . Ankle fracture yrs ago    left   . Morbid obesity ([facility])   . Arthritis   . Hyperlipidemia   . Asthma      hx of childhood asthma, none current  . Morbid obesity 08/10/2012  . Thyroid condition    Past Surgical History  Procedure Laterality Date  . Ankle surgery  age 66    left ankle   . Knee arthroscopy  yrs ago    right  . Thumb arthroscopy  age 60   left   . Ventral hernia repair  12/08/2011    Procedure: LAPAROSCOPIC VENTRAL HERNIA;  Surgeon: Gayland Curry, MD,FACS;  Location: [facility];  Service: General;  Laterality: N/A;   Social History   Social History  . Marital Status: Single    Spouse Name: N/A  . Number of Children: N/A  . Years of Education: N/A   Occupational History  . PE teacher    Social History Main Topics  . Smoking status: Never Smoker   . Smokeless tobacco: Never Used  . Alcohol Use: 0.0 oz/week    0 Standard drinks or equivalent per week     Comment: occassional; once monthly  . Drug Use: No  . Sexual Activity: Not on file   Other Topics Concern  . 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 Hypertension Mother   . Heart disease Neg Hx   . Colon cancer Neg Hx   . Cancer Father     prostate   Allergies  Allergen Reactions  . Latex Itching    Medication list has been reviewed and updated.   General: Denies fever, chills, sweats. No significant weight loss. Eyes: Denies blurring,significant itching ENT: Denies earache, sore throat,  and hoarseness. Cardiovascular: Denies chest pains, palpitations, dyspnea on exertion Respiratory: Denies cough, dyspnea at rest,wheeezing Breast: no concerns about lumps GI: Denies nausea, vomiting, diarrhea, constipation, change in bowel habits, abdominal pain, melena, hematochezia GU: Denies penile discharge, ED, urinary flow / outflow problems. No STD concerns. Musculoskeletal: Denies back pain, joint pain Derm: Denies rash, itching Neuro: Denies  paresthesias, frequent falls, frequent headaches Psych: MILD DEPRESSION Endocrine: Denies cold intolerance, heat intolerance, polydipsia Heme: Denies enlarged lymph nodes Allergy: No hayfever  Objective:   BP 116/80 mmHg  Pulse 86  Temp(Src) 98.3 F (36.8 C)  (Oral)  Ht 5' 10.5" (1.791 m)  Wt 351 lb 4 oz (159.326 kg)  BMI 49.67 kg/m2 Ideal Body Weight: Weight in (lb) to have BMI = 25: 176.4  No exam data present  GEN: well developed, well nourished, no acute distress Eyes: conjunctiva and lids normal, PERRLA, EOMI ENT: TM clear, nares clear, oral exam WNL Neck: supple, no lymphadenopathy, no thyromegaly, no JVD Pulm: clear to auscultation and percussion, respiratory effort normal CV: regular rate and rhythm, S1-S2, no murmur, rub or gallop, no bruits, peripheral pulses normal and symmetric, no cyanosis, clubbing, edema or varicosities GI: soft, non-tender; no hepatosplenomegaly, masses; active bowel sounds all quadrants GU: no hernia, testicular mass, penile discharge Lymph: no cervical, axillary or inguinal adenopathy MSK: gait normal, muscle tone and strength WNL, no joint swelling, effusions, discoloration, crepitus  SKIN: clear, good turgor, color WNL, no rashes, lesions, or ulcerations Neuro: normal mental status, normal strength, sensation, and motion Psych: alert; oriented to person, place and time, normally interactive and not anxious or depressed in appearance. All labs reviewed with patient.  Lipids:    Component Value Date/Time   CHOL 248* 10/14/2015 1531   TRIG 217.0* 10/14/2015 1531   HDL 37.10* 10/14/2015 1531   LDLDIRECT 175.0 10/14/2015 1531   VLDL 43.4* 10/14/2015 1531   CHOLHDL 7 10/14/2015 1531   CBC: CBC Latest Ref Rng 10/14/2015 10/14/2014 10/10/2013  WBC 4.0 - 10.5 K/uL 8.2 8.0 7.7  Hemoglobin 13.0 - 17.0 g/dL 14.5 14.7 14.7  Hematocrit 39.0 - 52.0 % 44.5 44.8 44.8  Platelets 150.0 - 400.0 K/uL 255.0 300.0 888.9    Basic Metabolic Panel:    Component Value Date/Time   NA 141 10/14/2015 1531   K 3.8 10/14/2015 1531   CL 104 10/14/2015 1531   CO2 28 10/14/2015 1531   BUN 11 10/14/2015 1531   CREATININE 1.02 10/14/2015 1531   GLUCOSE 122* 10/14/2015 1531   CALCIUM 9.7 10/14/2015 1531   Hepatic Function  Latest Ref Rng 10/14/2015 10/14/2014 10/10/2013  Total Protein 6.0 - 8.3 g/dL 7.4 7.5 7.4  Albumin 3.5 - 5.2 g/dL 4.3 4.3 4.0  AST 0 - 37 U/L _0 ALT 0 - 53 U/L _1 Alk Phosphatase 39 - 117 U/L 68 70 63  Total Bilirubin 0.2 - 1.2 mg/dL 0.5 0.4 1.2  Bilirubin, Direct 0.0 - 0.3 mg/dL 0.1 0.1 -    Lab Results  Component Value Date   TSH 6.85* 10/14/2015   Lab Results  Component Value Date   PSA 0.42 10/14/2015   PSA 0.45 10/14/2014   PSA 0.51 10/10/2013   Dg Foot Complete Right  10/15/2015  CLINICAL DATA:  RIGHT great toe and dorsal foot pain over first through third metacarpals following direct impact to great toe with a baseball, initial encounter EXAM: RIGHT FOOT COMPLETE - 3+ VIEW COMPARISON:  None FINDINGS: Osseous mineralization normal. Soft tissue  swelling diffusely at RIGHT foot greatest at the medial and dorsal margins of the first MTP joint. Joint space narrowing with spur formation first MTP joint. Bony overgrowth at medial margin of first metatarsal head. Small calcific densities are seen medial to the first metatarsal head, appear grossly corticated, question soft tissue calcification versus sequela of remote fracture. These do not have the typical appearance of an acute fracture fragments. Additional tiny triangular calcific density is seen at the dorsal medial aspect, base of proximal phalanx great toe, question age-indeterminate fracture fragment. No definite acute fracture, dislocation, or bone destruction. Additional degenerative changes noted at talonavicular joint with question talar beaking. Tiny plantar calcaneal spur. IMPRESSION: Degenerative changes first MTP joint and at talonavicular joint. Soft tissue swelling, greatest at first MTP joint. Question age-indeterminate avulsion fracture at the base of the proximal phalanx RIGHT great toe. Electronically Signed   By: Lavonia Dana M.D.   On: 10/15/2015 19:54     Assessment and Plan:   Healthcare  maintenance  Need for prophylactic vaccination with combined diphtheria-tetanus-pertussis (DTP) vaccine - Plan: Tdap vaccine greater than or equal to 7yo IM  Health Maintenance Exam: The patient's preventative maintenance and recommended screening tests for an annual wellness exam were reviewed in full today. Brought up to date unless services declined.  Counselled on the importance of diet, exercise, and its role in overall health and mortality. The patient's FH and SH was reviewed, including their home life, tobacco status, and drug and alcohol status.  Been off synthroid x 6 mos  Be carefull with 1st MTP joint, toe for next few weeks, avulsion fx  Follow-up: No Follow-up on file. Unless noted, follow-up in 1 year for Health Maintenance Exam.  Modified Medications   Modified Medication Previous Medication   LEVOTHYROXINE (SYNTHROID, LEVOTHROID) 75 MCG TABLET levothyroxine (SYNTHROID, LEVOTHROID) 75 MCG tablet      Take 1 tablet (75 mcg total) by mouth daily before breakfast. Reported on 10/15/2015    Take 75 mcg by mouth daily before breakfast. Reported on 10/15/2015   Orders Placed This Encounter  Procedures  . Tdap vaccine greater than or equal to 7yo IM    Signed,  [name] T. [name], MD   Patient's Medications  New Prescriptions   No medications on file  Previous Medications   DICLOFENAC (VOLTAREN) 75 MG EC TABLET    Take 1 tablet (75 mg total) by mouth 2 (two) times daily.  Modified Medications   Modified Medication Previous Medication   LEVOTHYROXINE (SYNTHROID, LEVOTHROID) 75 MCG TABLET levothyroxine (SYNTHROID, LEVOTHROID) 75 MCG tablet      Take 1 tablet (75 mcg total) by mouth daily before breakfast. Reported on 10/15/2015    Take 75 mcg by mouth daily before breakfast. Reported on 10/15/2015  Discontinued Medications   OVER THE COUNTER MEDICATION    Reported on 10/15/2015   OVER THE COUNTER MEDICATION    Reported on 10/15/2015

## 2015-10-20 ENCOUNTER — Encounter: Payer: Self-pay | Admitting: Family Medicine

## 2015-12-02 ENCOUNTER — Encounter: Payer: Self-pay | Admitting: Family Medicine

## 2015-12-02 ENCOUNTER — Ambulatory Visit
Admission: RE | Admit: 2015-12-02 | Discharge: 2015-12-02 | Disposition: A | Discharge status: Home / Self Care | Payer: BC Managed Care – PPO | Source: Ambulatory Visit | Attending: Family Medicine | Admitting: Family Medicine

## 2015-12-02 ENCOUNTER — Ambulatory Visit (INDEPENDENT_AMBULATORY_CARE_PROVIDER_SITE_OTHER): Payer: BC Managed Care – PPO | Admitting: Family Medicine

## 2015-12-02 VITALS — BP 120/80 | HR 78 | Temp 98.3°F | Ht 70.5 in | Wt 350.5 lb

## 2015-12-02 DIAGNOSIS — M545 Low back pain, unspecified: Secondary | ICD-10-CM

## 2015-12-02 DIAGNOSIS — Z6841 Body Mass Index (BMI) 40.0 and over, adult: Secondary | ICD-10-CM | POA: Diagnosis not present

## 2015-12-02 MED ORDER — PREDNISONE 20 MG PO TABS: ORAL_TABLET | ORAL | Status: DC

## 2015-12-02 MED ORDER — TIZANIDINE HCL 4 MG PO TABS: 4.0000 mg | ORAL_TABLET | Freq: Every evening | ORAL | Status: DC

## 2015-12-02 NOTE — Patient Instructions (Signed)

## 2015-12-02 NOTE — Progress Notes (Signed)
Dr. Karleen HampshireSpencer T. Zaleah Ternes, MD, [facility] [address] [place], [id number] Phone: [phone] Fax: [phone]  12/02/2015  Patient: Steve Ashley, MRN: [id number], DOB: 11/03/1965, 50 y.o.  Primary Physician:  Hannah BeatSpencer Malaiah Viramontes, MD   Chief Complaint  Patient presents with  . Back Pain    ?Hurt lifting speakers   Subjective:   Steve Ashley is a 50 y.o. very pleasant male patient who presents with the following: Back Pain  ongoing for approximately: 6 weeks The patient has had back pain before. The back pain is localized into the lumbar spine area. They also describe no radiculopathy.  6 weeks ago: Lifted some speakers. Will usually take down and will lift straight up, turned v lifted too high. Felt a twinge. Put his stuff in the car. Did not take it out of the car.   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 Lifted them straight up and has to lift them straight up. Hurting on the R buttock.   3-4 inches R of spine.  Dull pain with lying down.  Cannot really sit Bangladeshindian style.   Used a TENS unit.  In bed for 3 days.  Took some ibuprofen.   No numbness or tingling. No bowel or bladder incontinence. No focal weakness. Prior interventions: inversion table, NSAIDS, TENS unit Physical therapy: No Chiropractic manipulations: No Acupuncture: No Osteopathic manipulation: No Heat or cold: Minimal effect  Past Medical History, Surgical History, Family History, Medications, Allergies have been reviewed and updated if relevant.  Patient Active Problem List   Diagnosis Date Noted  . Family history of prostate cancer 10/10/2013  . Severe obesity (BMI >= 40) ([facility]) 05/09/2013  . OSA (obstructive sleep apnea) 08/29/2012  . Hypothyroidism 01/06/2010  . HYPERLIPIDEMIA 01/06/2010  . HALLUX RIGIDUS 07/20/2009    Past Medical History  Diagnosis Date  . Syndesmotic ankle sprain yrs ago  . Ankle fracture yrs ago    left     . Morbid obesity ([facility])   . Arthritis   . Hyperlipidemia   . Asthma      hx of childhood asthma, none current  . Morbid obesity 08/10/2012  . Hypothyroidism     Past Surgical History  Procedure Laterality Date  . Ankle surgery  age 50    left ankle   . Knee arthroscopy  yrs ago    right  . Thumb arthroscopy  age 50    left   . Ventral hernia repair  12/08/2011    Procedure: LAPAROSCOPIC VENTRAL HERNIA;  Surgeon: Atilano InaEric M Wilson, MD,FACS;  Location: [facility];  Service: General;  Laterality: N/A;    Social History   Social History  . Marital Status: Single    Spouse Name: N/A  . Number of Children: N/A  . Years of Education: N/A   Occupational History  . PE teacher    Social History Main Topics  . Smoking status: Never Smoker   . Smokeless tobacco: Never Used  . Alcohol Use: 0.0 oz/week    0 Standard drinks or equivalent per week     Comment: occassional; once monthly  . Drug Use: No  . Sexual Activity: Not on file   Other Topics Concern  . Not on file   Social History Narrative   Divorced   2 sons   PE Runner, broadcasting/film/videoteacher at [employer], television/film/videoelementary   Baseball and Football coach at [employer]    Family History  Problem  Relation Age of Onset  . Hypertension Mother   . Heart disease Neg Hx   . Colon cancer Neg Hx   . Cancer Father     prostate    Allergies  Allergen Reactions  . Latex Itching    Medication list reviewed and updated in full in [name].  GEN: No fevers, chills. Nontoxic. Primarily MSK c/o today. MSK: Detailed in the HPI GI: tolerating PO intake without difficulty Neuro: As above  Otherwise the pertinent positives of the ROS are noted above.    Objective:   Blood pressure 120/80, pulse 78, temperature 98.3 F (36.8 C), temperature source Oral, height 5' 10.5" (1.791 m), weight 350 lb 8 oz (158.986 kg).  Gen: Well-developed,well-nourished,in no acute distress; alert,appropriate and cooperative throughout examination HEENT: Normocephalic  and atraumatic without obvious abnormalities.  Ears, externally no deformities Pulm: Breathing comfortably in no respiratory distress Range of motion at  the waist: Flexion, rotation and lateral bending: flexion is limited to approximately 70.  Normal extension and lateral bending and rotation.   No echymosis or edema Rises to examination table with no difficulty Gait: minimally antalgic  Inspection/Deformity: No abnormality Paraspinus T:  Tender to palpation from L1-S1 bilaterally  B Ankle Dorsiflexion (L5,4): 5/5 B Great Toe Dorsiflexion (L5,4): 5/5 Heel Walk (L5): WNL Toe Walk (S1): WNL Rise/Squat (L4): WNL, mild pain  SENSORY B Medial Foot (L4): WNL B Dorsum (L5): WNL B Lateral (S1): WNL Light Touch: WNL Pinprick: WNL  REFLEXES Knee (L4): 2+ Ankle (S1): 2+  B SLR, seated: neg B SLR, supine: neg B FABER: neg B Reverse FABER: neg B Greater Troch: NT B Log Roll: neg B Stork: NT B Sciatic Notch: NT  Radiology: Dg Lumbar Spine Complete  12/02/2015  CLINICAL DATA:  Right lower lumbar pain for 6 weeks, lifted heavy object 6 weeks ago EXAM: LUMBAR SPINE - COMPLETE 4+ VIEW COMPARISON:  CT scan 12/05/2011 FINDINGS: Five views of the lumbar spine submitted. No acute fracture or subluxation. Alignment and vertebral body heights are preserved. Minimal disc space flattening at L4-L5 level. IMPRESSION: No acute fracture or subluxation. Minimal disc space flattening at L4-L5 level. Electronically Signed   By: Natasha Mead M.D.   On: 12/02/2015 11:36    Assessment and Plan:   Right-sided low back pain without sciatica - Plan: DG Lumbar Spine Complete, CANCELED: DG Lumbar Spine Complete  Morbid obesity with BMI of 45.0-49.9, adult ([facility]) - Plan: Ambulatory referral to General Surgery  Anatomy reviewed. Conservative algorithms for acute back pain generally begin with the following: NSAIDS, Muscle Relaxants, Mild pain medication  Start with medications, core rehab, and progress from  there following low back pain algorithm. No red flags are present.  Body mass index is 49.56 kg/(m^2).  He also has interest in discussing his case with the bariatric service.  He has been unable to lose weight and is been trying basically the entire time I have known him.  He is very physically active her historically has been, and he has morbid obesity, sleep apnea as well as hyperlipidemia.  Follow-up: 1 mo if not improved  New Prescriptions   PREDNISONE (DELTASONE) 20 MG TABLET    Prednisone 20 mg, 2 tabs po for 5 days, then 1 tab po for 5 days, #15, 0 ref   TIZANIDINE (ZANAFLEX) 4 MG TABLET    Take 1 tablet (4 mg total) by mouth Nightly.   Orders Placed This Encounter  Procedures  . DG Lumbar Spine Complete  .  Ambulatory referral to General Surgery    Signed,  Steve HampshireSpencer T. Gessica Jawad, MD   Patient's Medications  New Prescriptions   PREDNISONE (DELTASONE) 20 MG TABLET    Prednisone 20 mg, 2 tabs po for 5 days, then 1 tab po for 5 days, #15, 0 ref   TIZANIDINE (ZANAFLEX) 4 MG TABLET    Take 1 tablet (4 mg total) by mouth Nightly.  Previous Medications   LEVOTHYROXINE (SYNTHROID, LEVOTHROID) 75 MCG TABLET    Take 1 tablet (75 mcg total) by mouth daily before breakfast. Reported on 10/15/2015  Modified Medications   No medications on file  Discontinued Medications   DICLOFENAC (VOLTAREN) 75 MG EC TABLET    Take 1 tablet (75 mg total) by mouth 2 (two) times daily.

## 2015-12-02 NOTE — Progress Notes (Signed)
Pre visit review using our clinic review tool, if applicable. No additional management support is needed unless otherwise documented below in the visit note. 

## 2016-01-28 ENCOUNTER — Encounter: Payer: Self-pay | Admitting: Family Medicine

## 2016-01-28 ENCOUNTER — Ambulatory Visit (INDEPENDENT_AMBULATORY_CARE_PROVIDER_SITE_OTHER): Payer: BC Managed Care – PPO | Admitting: Family Medicine

## 2016-01-28 VITALS — BP 140/94 | HR 72 | Temp 98.4°F | Ht 70.5 in | Wt 359.8 lb

## 2016-01-28 DIAGNOSIS — M545 Low back pain, unspecified: Secondary | ICD-10-CM

## 2016-01-28 MED ORDER — TIZANIDINE HCL 4 MG PO TABS: 4.0000 mg | ORAL_TABLET | Freq: Every evening | ORAL | 1 refills | Status: DC

## 2016-01-28 NOTE — Progress Notes (Signed)
Dr. Karleen Hampshire T. Arlynn Stare, MD, CAQ Sports Medicine Primary Care and Sports Medicine [address] [place], [address] Phone: [phone] Fax: [phone]  01/28/2016  Patient: Steve Ashley, MRN: [id number], DOB: 18-Feb-1966, 50 y.o.  Primary Physician:  Hannah Beat, MD   Chief Complaint  Patient presents with  . Back Pain    Has gotten worse   Subjective:   Tyreik B Dyar is a 50 y.o. very pleasant male patient who presents with the following: Back Pain  Doing worse Took some muscle relaxers.  Took some prednisone Went to church camp, and then could not really move and hard to get out of bed.  Hard time even walking in the gym.  Took one step and froze.  No numbness or tingling.   At this point who is doing worse clinically.  He is having pain almost all the time.  He cannot lift anything while he is at work, and he is functionally impaired.  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  He is having to ride around in the gator most of the time.  12/02/2015 Last OV with Hannah Beat, MD  ongoing for approximately: 6 weeks The patient has had back pain before. The back pain is localized into the lumbar spine area. They also describe no radiculopathy.  6 weeks ago: Lifted some speakers. Will usually take down and will lift straight up, turned v lifted too high. Felt a twinge. Put his stuff in the car. Did not take it out of the car.   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 Lifted them straight up and has to lift them straight up. Hurting on the R buttock.   3-4 inches R of spine.  Dull pain with lying down.  Cannot really sit [place] style.   Used a TENS unit.  In bed for 3 days.  Took some ibuprofen.   No numbness or tingling. No bowel or bladder incontinence. No focal weakness. Prior interventions: inversion table, NSAIDS, TENS unit Physical therapy: No Chiropractic manipulations: No Acupuncture: No Osteopathic manipulation: No Heat or  cold: Minimal effect  Past Medical History, Surgical History, Family History, Medications, Allergies have been reviewed and updated if relevant.  Patient Active Problem List   Diagnosis Date Noted  . Family history of prostate cancer 10/10/2013  . Severe obesity (BMI >= 40) (HCC) 05/09/2013  . OSA (obstructive sleep apnea) 08/29/2012  . Hypothyroidism 01/06/2010  . HYPERLIPIDEMIA 01/06/2010  . HALLUX RIGIDUS 07/20/2009    Past Medical History:  Diagnosis Date  . Ankle fracture yrs ago   left   . Arthritis   . Asthma     hx of childhood asthma, none current  . Hyperlipidemia   . Hypothyroidism   . Morbid obesity 08/10/2012  . Morbid obesity (HCC)   . Syndesmotic ankle sprain yrs ago    Past Surgical History:  Procedure Laterality Date  . ANKLE SURGERY  age 56   left ankle   . KNEE ARTHROSCOPY  yrs ago   right  . THUMB ARTHROSCOPY  age 51   left   . VENTRAL HERNIA REPAIR  12/08/2011   Procedure: LAPAROSCOPIC VENTRAL HERNIA;  Surgeon: Atilano Ina, MD,FACS;  Location: [facility];  Service: General;  Laterality: N/A;    Social History   Social History  . Marital status: Single    Spouse name: N/A  . Number of children: N/A  . Years of education: N/A   Occupational  History  . PE teacher    Social History Main Topics  . Smoking status: Never Smoker  . Smokeless tobacco: Never Used  . Alcohol use 0.0 oz/week     Comment: occassional; once monthly  . Drug use: No  . Sexual activity: Not on file   Other Topics Concern  . 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 Hypertension Mother   . Heart disease Neg Hx   . Colon cancer Neg Hx   . Cancer Father     prostate    Allergies  Allergen Reactions  . Latex Itching    Medication list reviewed and updated in full in [facility].  GEN: No fevers,  chills. Nontoxic. Primarily MSK c/o today. MSK: Detailed in the HPI GI: tolerating PO intake without difficulty Neuro: As above  Otherwise the pertinent positives of the ROS are noted above.    Objective:   Blood pressure (!) 140/94, pulse 72, temperature 98.4 F (36.9 C), temperature source Oral, height 5' 10.5" (1.791 m), weight (!) 359 lb 12 oz (163.2 kg).  Gen: Well-developed,well-nourished,in no acute distress; alert,appropriate and cooperative throughout examination HEENT: Normocephalic and atraumatic without obvious abnormalities.  Ears, externally no deformities Pulm: Breathing comfortably in no respiratory distress Range of motion at  the waist: Flexion, rotation and lateral bending: flexion is limited to approximately 100.  Normal extension and lateral bending and rotation.   No echymosis or edema Rises to examination table with no difficulty Gait: minimally antalgic  Inspection/Deformity: No abnormality Paraspinus T:  Tender to palpation from L1-S1 bilaterally  B Ankle Dorsiflexion (L5,4): 5/5 B Great Toe Dorsiflexion (L5,4): 5/5 Heel Walk (L5): WNL Toe Walk (S1): WNL Rise/Squat (L4): WNL, mild pain  SENSORY B Medial Foot (L4): WNL B Dorsum (L5): WNL B Lateral (S1): WNL Light Touch: WNL Pinprick: WNL  REFLEXES Knee (L4): 2+ Ankle (S1): 2+  B SLR, seated: neg B SLR, supine: neg B FABER: neg B Reverse FABER: neg B Greater Troch: NT B Log Roll: neg B Stork: NT B Sciatic Notch: NT  Radiology: Dg Lumbar Spine Complete  Result Date: 12/02/2015 CLINICAL DATA:  Right lower lumbar pain for 6 weeks, lifted heavy object 6 weeks ago EXAM: LUMBAR SPINE - COMPLETE 4+ VIEW COMPARISON:  CT scan 12/05/2011 FINDINGS: Five views of the lumbar spine submitted. No acute fracture or subluxation. Alignment and vertebral body heights are preserved. Minimal disc space flattening at L4-L5 level. IMPRESSION: No acute fracture or subluxation. Minimal disc space flattening at L4-L5  level. Electronically Signed   By: Natasha MeadLiviu  Pop M.D.   On: 12/02/2015 11:36   Assessment and Plan:   Bilateral low back pain without sciatica - Plan: MR Lumbar Spine Wo Contrast  Very knowledgeable, historically athletic coach who presents with ongoing back pain for greater than 2 months with failure of conservative management.  He has been on anti-inflammatories, oral steroids, muscle relaxants, and has failed the diligent home exercise program.  At this point he is doing worse clinically and he is having a hard time functioning with his activities of daily living and work.  Obtain an MRI of the lumbar spine without contrast to evaluate for spinal stenosis, spinal cord edema, foraminal stenosis, disc herniation, or other acute injury that might be the cause  of the patient's ongoing issues.  Follow-up: depending on MRI findings.  Modified Medications   Modified Medication Previous Medication   TIZANIDINE (ZANAFLEX) 4 MG TABLET tiZANidine (ZANAFLEX) 4 MG tablet      Take 1 tablet (4 mg total) by mouth Nightly.    Take 1 tablet (4 mg total) by mouth Nightly.   Orders Placed This Encounter  Procedures  . MR Lumbar Spine Wo Contrast    Signed,  Baylie Drakes T. Shawnika Pepin, MD   Patient's Medications  New Prescriptions   No medications on file  Previous Medications   LEVOTHYROXINE (SYNTHROID, LEVOTHROID) 75 MCG TABLET    Take 1 tablet (75 mcg total) by mouth daily before breakfast. Reported on 10/15/2015  Modified Medications   Modified Medication Previous Medication   TIZANIDINE (ZANAFLEX) 4 MG TABLET tiZANidine (ZANAFLEX) 4 MG tablet      Take 1 tablet (4 mg total) by mouth Nightly.    Take 1 tablet (4 mg total) by mouth Nightly.  Discontinued Medications   PREDNISONE (DELTASONE) 20 MG TABLET    Prednisone 20 mg, 2 tabs po for 5 days, then 1 tab po for 5 days, #15, 0 ref

## 2016-01-28 NOTE — Progress Notes (Signed)
Pre visit review using our clinic review tool, if applicable. No additional management support is needed unless otherwise documented below in the visit note. 

## 2016-02-09 ENCOUNTER — Ambulatory Visit
Admission: RE | Admit: 2016-02-09 | Discharge: 2016-02-09 | Disposition: A | Discharge status: Home / Self Care | Payer: BC Managed Care – PPO | Source: Ambulatory Visit | Attending: Family Medicine | Admitting: Family Medicine

## 2016-02-09 DIAGNOSIS — M545 Low back pain, unspecified: Secondary | ICD-10-CM

## 2016-02-12 ENCOUNTER — Telehealth: Payer: Self-pay | Admitting: Family Medicine

## 2016-02-12 NOTE — Telephone Encounter (Signed)
Patient called to get his MRI results from Tuesday.  Please call patient.

## 2016-02-15 ENCOUNTER — Other Ambulatory Visit: Payer: Self-pay | Admitting: Family Medicine

## 2016-02-15 DIAGNOSIS — M5126 Other intervertebral disc displacement, lumbar region: Secondary | ICD-10-CM

## 2016-02-15 DIAGNOSIS — M545 Low back pain, unspecified: Secondary | ICD-10-CM

## 2016-02-15 DIAGNOSIS — M48061 Spinal stenosis, lumbar region without neurogenic claudication: Secondary | ICD-10-CM

## 2016-02-15 NOTE — Telephone Encounter (Signed)
Mr. Steve Ashley notified as instructed by telephone (see result note on MRI 02/09/2016). He is agreeable to referral for Neurosurgeon.  He states he is in a lot of pain and wants to know if he can get a Rx for a low back brace or a tens unit.  He states he went to try a buy a back back but they did not have his size.

## 2016-02-15 NOTE — Telephone Encounter (Signed)
See result note.  

## 2016-02-15 NOTE — Telephone Encounter (Signed)
NSG consultation made.

## 2016-02-15 NOTE — Telephone Encounter (Signed)
Patient called to get the results of his MRI. Please call patient back at [phone].

## 2016-02-17 NOTE — Telephone Encounter (Signed)
All done

## 2016-02-17 NOTE — Telephone Encounter (Signed)
Called [facility] about back brace.  They will be about to fit him for a back brace.  They need a prescription written for back brace faxed over to them at [phone]. Steve Ashley notified.  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  Note written as requested.

## 2016-02-17 NOTE — Telephone Encounter (Signed)
Rx Faxed to [facility] for back brace at [phone].

## 2016-02-23 ENCOUNTER — Other Ambulatory Visit: Payer: Self-pay | Admitting: Neurosurgery

## 2016-02-23 DIAGNOSIS — M5126 Other intervertebral disc displacement, lumbar region: Secondary | ICD-10-CM

## 2016-02-26 ENCOUNTER — Ambulatory Visit
Admission: RE | Admit: 2016-02-26 | Discharge: 2016-02-26 | Disposition: A | Discharge status: Home / Self Care | Payer: BC Managed Care – PPO | Source: Ambulatory Visit | Attending: Neurosurgery | Admitting: Neurosurgery

## 2016-02-26 DIAGNOSIS — M5126 Other intervertebral disc displacement, lumbar region: Secondary | ICD-10-CM

## 2016-02-26 MED ORDER — METHYLPREDNISOLONE ACETATE 40 MG/ML INJ SUSP (RADIOLOG
120.0000 mg | Freq: Once | INTRAMUSCULAR | Status: AC
  Administered 2016-02-26: 120 mg via EPIDURAL

## 2016-02-26 MED ORDER — IOPAMIDOL (ISOVUE-M 200) INJECTION 41%
1.0000 mL | Freq: Once | INTRAMUSCULAR | Status: AC
  Administered 2016-02-26: 1 mL via EPIDURAL

## 2016-02-26 NOTE — Discharge Instructions (Signed)

## 2016-04-25 ENCOUNTER — Other Ambulatory Visit: Payer: Self-pay | Admitting: Family Medicine

## 2016-04-25 NOTE — Telephone Encounter (Signed)
Last office visit 01/28/2016.  Last refilled 01/28/2016 for #30 with 1 refills.  Ok to refill?

## 2016-06-23 ENCOUNTER — Other Ambulatory Visit: Payer: Self-pay | Admitting: Family Medicine

## 2016-06-23 NOTE — Telephone Encounter (Signed)
Last office visit 01/28/2016.  Last refilled 04/25/2016 for #30 with 1 refill.  Ok to refill?

## 2016-10-16 ENCOUNTER — Other Ambulatory Visit: Payer: Self-pay | Admitting: Family Medicine

## 2016-10-16 NOTE — Telephone Encounter (Signed)
Last office visit 01/28/2016.  Last refilled 06/23/16 for #30 with 1 refill.  Ok to refill?

## 2016-10-31 ENCOUNTER — Other Ambulatory Visit: Payer: Self-pay | Admitting: Family Medicine

## 2016-11-03 ENCOUNTER — Telehealth: Payer: Self-pay | Admitting: Family Medicine

## 2016-11-03 NOTE — Telephone Encounter (Signed)
Patient Name: Steve HearingDARRYL Streets  DOB: 05/16/1966    Initial Comment Caller had blood in stool for the last two occasions, trying to get appt    Nurse Assessment  Nurse: Stefano GaulStringer, RN, Dwana CurdVera Date/Time (Eastern Time): 11/03/2016 9:52:50 AM  Confirm and document reason for call. If symptomatic, describe symptoms. ---Caller states he has had blood the last 2 times when he wipes after a BM.  Does the patient have any new or worsening symptoms? ---Yes  Will a triage be completed? ---Yes  Related visit to physician within the last 2 weeks? ---No  Does the PT have any chronic conditions? (i.e. diabetes, asthma, etc.) ---Yes  List chronic conditions. ---hypothyroidism  Is this a behavioral health or substance abuse call? ---No     Guidelines    Guideline Title Affirmed Question Affirmed Notes  Rectal Bleeding Age > 50 years    Final Disposition User   See PCP within 2 [place] CommentWeeks Stringer, RN, Dwana CurdVera    Comments  appt scheduled for 2 pm on 11/07/2016 with Dr. Karleen HampshireSpencer [name]   Referrals  REFERRED TO PCP OFFICE   Disagree/Comply: Comply

## 2016-11-03 NOTE — Telephone Encounter (Signed)
Pt has appt with Dr Patsy Lageropland 11/07/16 at 2 pm.

## 2016-11-07 ENCOUNTER — Ambulatory Visit (INDEPENDENT_AMBULATORY_CARE_PROVIDER_SITE_OTHER): Payer: BC Managed Care – PPO | Admitting: Family Medicine

## 2016-11-07 ENCOUNTER — Encounter: Payer: Self-pay | Admitting: Family Medicine

## 2016-11-07 VITALS — BP 132/90 | HR 93 | Temp 98.4°F | Ht 70.5 in | Wt 356.0 lb

## 2016-11-07 DIAGNOSIS — R6882 Decreased libido: Secondary | ICD-10-CM | POA: Diagnosis not present

## 2016-11-07 DIAGNOSIS — R5383 Other fatigue: Secondary | ICD-10-CM | POA: Diagnosis not present

## 2016-11-07 DIAGNOSIS — K625 Hemorrhage of anus and rectum: Secondary | ICD-10-CM

## 2016-11-07 NOTE — Progress Notes (Signed)
Dr. Karleen Hampshire T. Asmar Brozek, MD, CAQ Sports Medicine Primary Care and Sports Medicine [address] [place], [address] Phone: [phone] Fax: [phone]  11/07/2016  Patient: Steve Ashley, MRN: [id number], DOB: 1965/07/19, 51 y.o.  Primary Physician:  Hannah Beat, MD   Chief Complaint  Patient presents with  . Blood In Stools   Subjective:   Abdulkareem B Demore is a 51 y.o. very pleasant male patient who presents with the following:  Multiple issues.  Bright red blood in the stool - No pain with going.  Has been ongoing last week - most of last week.  No signs last week.  Basically he had 3 or 4 days where he had some bleeding when he was wiping the toilet that was painless.  Wants to lose some weight.  Frustrated.  He is frustrated by his inability to date.  He is been celibate for some time.  He is concerned about his lack of libido.  He asked about male hormonal replacement, having seen some testosterone commercials.  Past Medical History, Surgical History, Social History, Family History, Problem List, Medications, and Allergies have been reviewed and updated if relevant.  Patient Active Problem List   Diagnosis Date Noted  . Family history of prostate cancer 10/10/2013  . Severe obesity (BMI >= 40) (HCC) 05/09/2013  . OSA (obstructive sleep apnea) 08/29/2012  . Hypothyroidism 01/06/2010  . HYPERLIPIDEMIA 01/06/2010  . HALLUX RIGIDUS 07/20/2009    Past Medical History:  Diagnosis Date  . Ankle fracture yrs ago   left   . Arthritis   . Asthma     hx of childhood asthma, none current  . Hyperlipidemia   . Hypothyroidism   . Morbid obesity 08/10/2012  . Morbid obesity (HCC)   . Syndesmotic ankle sprain yrs ago    Past Surgical History:  Procedure Laterality Date  . ANKLE SURGERY  age 68   left ankle   . KNEE ARTHROSCOPY  yrs ago   right  . THUMB ARTHROSCOPY  age 24   left   . VENTRAL HERNIA REPAIR  12/08/2011   Procedure: LAPAROSCOPIC  VENTRAL HERNIA;  Surgeon: Atilano Ina, MD,FACS;  Location: [facility];  Service: General;  Laterality: N/A;    Social History   Social History  . Marital status: Single    Spouse name: N/A  . Number of children: N/A  . Years of education: N/A   Occupational History  . PE teacher    Social History Main Topics  . Smoking status: Never Smoker  . Smokeless tobacco: Never Used  . Alcohol use 0.0 oz/week     Comment: occassional; once monthly  . Drug use: No  . Sexual activity: Not on file   Other Topics Concern  . 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 Hypertension Mother   . Heart disease Neg Hx   . Colon cancer Neg Hx   . Cancer Father        prostate    Allergies  Allergen Reactions  . Latex Itching    Medication list reviewed and updated in full in [facility].   GEN: No acute illnesses, no fevers, chills. GI: No n/v/d, eating normally Pulm: No SOB Interactive and getting along well at home.  Otherwise,  ROS is as per the HPI.  Objective:   BP 132/90   Pulse 93   Temp 98.4 F (36.9 C) (Oral)   Ht 5' 10.5" (1.791 m)   Wt (!) 356 lb (161.5 kg)   BMI 50.36 kg/m   GEN: WDWN, NAD, Non-toxic, A & O x 3 HEENT: Atraumatic, Normocephalic. Neck supple. No masses, No LAD. Ears and Nose: No external deformity. CV: RRR, No M/G/R. No JVD. No thrill. No extra heart sounds. PULM: CTA B, no wheezes, crackles, rhonchi. No retractions. No resp. distress. No accessory muscle use. Rectal: normal externally with good tone.  There is a questionable small hemorrhoid on the inferior aspect.2 EXTR: No c/c/e NEURO Normal gait.  PSYCH: Normally interactive. Conversant. Not depressed or anxious appearing.  Calm demeanor.   Laboratory and Imaging Data:  Assessment and Plan:   Rectal bleeding  Other  fatigue  Decreased libido  >25 minutes spent in face to face time with patient, >50% spent in counselling or coordination of care   Rectal bleeding of unclear source last week.  Reviewed very recent colonoscopy which was normal. Follow only for now.  Complicated questions were of fatigue and libido.  He asked about potential testosterone therapy and replacement. We discussed risks including that of prostate cancer and cardiovascular risks.  Nevertheless, he will obtain passing labs including a fasting a.m. Testosterone from 8 - 10 AM in the morning at his next blood draw.  Follow-up: No Follow-up on file.  Future Appointments Date Time Provider Department Center  12/15/2016 8:30 AM Velita Quirk, Karleen HampshireSpencer, MD [facility]    Meds ordered this encounter  Medications  . diclofenac (VOLTAREN) 75 MG EC tablet    Sig: Take 75 mg by mouth 2 (two) times daily.   Signed,  Elpidio GaleaSpencer T. Bodey Frizell, MD   Allergies as of 11/07/2016      Reactions   Latex Itching      Medication List       Accurate as of 11/07/16 11:59 PM. Always use your most recent med list.          diclofenac 75 MG EC tablet Commonly known as:  VOLTAREN Take 75 mg by mouth 2 (two) times daily.   levothyroxine 75 MCG tablet Commonly known as:  SYNTHROID, LEVOTHROID TAKE 1 TABLET (75 MCG TOTAL) BY MOUTH DAILY BEFORE BREAKFAST. REPORTED ON 10/15/2015   tiZANidine 4 MG tablet Commonly known as:  ZANAFLEX TAKE 1 TABLET (4 MG TOTAL) BY MOUTH NIGHTLY.

## 2016-12-15 ENCOUNTER — Encounter: Payer: Self-pay | Admitting: Family Medicine

## 2016-12-15 ENCOUNTER — Ambulatory Visit (INDEPENDENT_AMBULATORY_CARE_PROVIDER_SITE_OTHER): Payer: BC Managed Care – PPO | Admitting: Family Medicine

## 2016-12-15 VITALS — BP 118/84 | HR 77 | Temp 98.2°F | Ht 70.5 in | Wt 354.0 lb

## 2016-12-15 DIAGNOSIS — Z0000 Encounter for general adult medical examination without abnormal findings: Secondary | ICD-10-CM

## 2016-12-15 DIAGNOSIS — R5383 Other fatigue: Secondary | ICD-10-CM | POA: Diagnosis not present

## 2016-12-15 DIAGNOSIS — Z8042 Family history of malignant neoplasm of prostate: Secondary | ICD-10-CM | POA: Diagnosis not present

## 2016-12-15 DIAGNOSIS — Z125 Encounter for screening for malignant neoplasm of prostate: Secondary | ICD-10-CM | POA: Diagnosis not present

## 2016-12-15 DIAGNOSIS — F321 Major depressive disorder, single episode, moderate: Secondary | ICD-10-CM | POA: Diagnosis not present

## 2016-12-15 DIAGNOSIS — E785 Hyperlipidemia, unspecified: Secondary | ICD-10-CM | POA: Diagnosis not present

## 2016-12-15 DIAGNOSIS — E038 Other specified hypothyroidism: Secondary | ICD-10-CM | POA: Diagnosis not present

## 2016-12-15 DIAGNOSIS — Z0001 Encounter for general adult medical examination with abnormal findings: Secondary | ICD-10-CM | POA: Diagnosis not present

## 2016-12-15 LAB — LIPID PANEL
Cholesterol: 235 mg/dL — ABNORMAL HIGH (ref 0–200)
HDL: 40.2 mg/dL (ref >39.00)
LDL Cholesterol: 171 mg/dL — ABNORMAL HIGH (ref 0–99)
NonHDL: 195.16
Total CHOL/HDL Ratio: 6
Triglycerides: 121 mg/dL (ref 0.0–149.0)
VLDL: 24.2 mg/dL (ref 0.0–40.0)

## 2016-12-15 LAB — CBC WITH DIFFERENTIAL/PLATELET
Basophils Absolute: 0.1 10*3/uL (ref 0.0–0.1)
Basophils Relative: 1 % (ref 0.0–3.0)
Eosinophils Absolute: 0.3 10*3/uL (ref 0.0–0.7)
Eosinophils Relative: 4.9 % (ref 0.0–5.0)
HCT: 45.7 % (ref 39.0–52.0)
Hemoglobin: 14.8 g/dL (ref 13.0–17.0)
Lymphocytes Relative: 36.1 % (ref 12.0–46.0)
Lymphs Abs: 2.2 10*3/uL (ref 0.7–4.0)
MCHC: 32.3 g/dL (ref 30.0–36.0)
MCV: 88 fl (ref 78.0–100.0)
Monocytes Absolute: 0.6 10*3/uL (ref 0.1–1.0)
Monocytes Relative: 10.1 % (ref 3.0–12.0)
Neutro Abs: 2.9 10*3/uL (ref 1.4–7.7)
Neutrophils Relative %: 47.9 % (ref 43.0–77.0)
Platelets: 258 10*3/uL (ref 150.0–400.0)
RBC: 5.2 Mil/uL (ref 4.22–5.81)
RDW: 15.3 % (ref 11.5–15.5)
WBC: 6.1 10*3/uL (ref 4.0–10.5)

## 2016-12-15 LAB — BASIC METABOLIC PANEL
BUN: 17 mg/dL (ref 6–23)
CO2: 31 mEq/L (ref 19–32)
Calcium: 9.8 mg/dL (ref 8.4–10.5)
Chloride: 104 mEq/L (ref 96–112)
Creatinine, Ser: 1.01 mg/dL (ref 0.40–1.50)
GFR: 99.68 mL/min (ref >60.00)
Glucose, Bld: 98 mg/dL (ref 70–99)
Potassium: 4.3 mEq/L (ref 3.5–5.1)
Sodium: 140 mEq/L (ref 135–145)

## 2016-12-15 LAB — HEPATIC FUNCTION PANEL
ALT: 16 U/L (ref 0–53)
AST: 14 U/L (ref 0–37)
Albumin: 4.2 g/dL (ref 3.5–5.2)
Alkaline Phosphatase: 71 U/L (ref 39–117)
Bilirubin, Direct: 0.1 mg/dL (ref 0.0–0.3)
Total Bilirubin: 0.5 mg/dL (ref 0.2–1.2)
Total Protein: 7.3 g/dL (ref 6.0–8.3)

## 2016-12-15 LAB — T4, FREE: Free T4: 0.73 ng/dL (ref 0.60–1.60)

## 2016-12-15 LAB — T3, FREE: T3, Free: 2.8 pg/mL (ref 2.3–4.2)

## 2016-12-15 LAB — PSA: PSA: 0.56 ng/mL (ref 0.10–4.00)

## 2016-12-15 LAB — TSH: TSH: 9.27 u[IU]/mL — ABNORMAL HIGH (ref 0.35–4.50)

## 2016-12-15 MED ORDER — ESCITALOPRAM OXALATE 10 MG PO TABS: 10.0000 mg | ORAL_TABLET | Freq: Every day | ORAL | 2 refills | Status: DC

## 2016-12-15 NOTE — Progress Notes (Addendum)
Dr. Frederico Hamman T. Girl Schissler, MD, [facility] [place], [address] Phone: [phone] Fax: [phone]  12/15/2016  Patient: Steve Ashley, MRN: [id number], DOB: 1965/10/05, 51 y.o.  Primary Physician:  Owens Loffler, MD   Chief Complaint  Patient presents with  . Annual Exam   Subjective:   Steve Ashley is a 51 y.o. pleasant patient who presents with the following:  Preventative Health Maintenance Visit:  Health Maintenance Summary Reviewed and updated, unless pt declines services.  Tobacco History Reviewed. Alcohol: No concerns, no excessive use Exercise Habits: Some activity, rec at least 30 mins 5 times a week STD concerns: no risk or activity to increase risk Drug Use: None Encouraged self-testicular check  Body mass index is 50.08 kg/m.  Eating 1-2 times a day  Fatigue, depression - see below  Health Maintenance  Topic Date Due  . INFLUENZA VACCINE  12/28/2016  . COLONOSCOPY  12/17/2024  . TETANUS/TDAP  10/18/2025  . HIV Screening  Completed   Immunization History  Administered Date(s) Administered  . Tdap 10/19/2015   Patient Active Problem List   Diagnosis Date Noted  . Family history of prostate cancer 10/10/2013  . Severe obesity (BMI >= 40) ([place]) 05/09/2013  . OSA (obstructive sleep apnea) 08/29/2012  . Hypothyroidism 01/06/2010  . Hyperlipidemia LDL goal <100 01/06/2010  . HALLUX RIGIDUS 07/20/2009   Past Medical History:  Diagnosis Date  . Ankle fracture yrs ago   left   . Arthritis   . Asthma     hx of childhood asthma, none current  . Hyperlipidemia   . Hypothyroidism   . Morbid obesity 08/10/2012  . Morbid obesity ([place])   . Syndesmotic ankle sprain yrs ago   Past Surgical History:  Procedure Laterality Date  . ANKLE SURGERY  age 86   left ankle   . KNEE ARTHROSCOPY  yrs ago   right  . THUMB ARTHROSCOPY  age 89   left   . VENTRAL HERNIA REPAIR  12/08/2011   Procedure: LAPAROSCOPIC VENTRAL HERNIA;  Surgeon: Gayland Curry, MD,FACS;  Location: [facility];  Service: General;  Laterality: N/A;   Social History   Social History  . Marital status: Single    Spouse name: N/A  . Number of children: N/A  . Years of education: N/A   Occupational History  . PE teacher    Social History Main Topics  . Smoking status: Never Smoker  . Smokeless tobacco: Never Used  . Alcohol use 0.0 oz/week     Comment: occassional; once monthly  . Drug use: No  . Sexual activity: Not on file   Other Topics Concern  . 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 Hypertension Mother   . Heart disease Neg Hx   . Colon cancer Neg Hx   . Cancer Father        prostate   Allergies  Allergen Reactions  . Latex Itching    Medication list has been reviewed and updated.   General: Denies fever, chills, sweats. No significant weight loss. Eyes: Denies blurring,significant itching ENT: Denies earache, sore throat, and hoarseness. Cardiovascular: Denies chest pains, palpitations, dyspnea on exertion Respiratory: Denies cough, dyspnea at rest,wheeezing Breast: no concerns about lumps GI:  Denies nausea, vomiting, diarrhea, constipation, change in bowel habits, abdominal pain, melena, hematochezia GU: Denies penile discharge, ED, urinary flow / outflow problems. No STD concerns. Musculoskeletal: Denies back pain, joint pain Derm: Denies rash, itching Neuro: Denies  paresthesias, frequent falls, frequent headaches Psych: below Endocrine: Denies cold intolerance, heat intolerance, polydipsia Heme: Denies enlarged lymph nodes Allergy: No hayfever  Objective:   BP 118/84   Pulse 77   Temp 98.2 F (36.8 C) (Oral)   Ht 5' 10.5" (1.791 m)   Wt (!) 354 lb (160.6 kg)   BMI 50.08 kg/m  Ideal Body Weight: Weight  in (lb) to have BMI = 25: 176.4  No exam data present  GEN: well developed, well nourished, no acute distress Eyes: conjunctiva and lids normal, PERRLA, EOMI ENT: TM clear, nares clear, oral exam WNL Neck: supple, no lymphadenopathy, no thyromegaly, no JVD Pulm: clear to auscultation and percussion, respiratory effort normal CV: regular rate and rhythm, S1-S2, no murmur, rub or gallop, no bruits, peripheral pulses normal and symmetric, no cyanosis, clubbing, edema or varicosities GI: soft, non-tender; no hepatosplenomegaly, masses; active bowel sounds all quadrants GU: no hernia, testicular mass, penile discharge Lymph: no cervical, axillary or inguinal adenopathy MSK: gait normal, muscle tone and strength WNL, no joint swelling, effusions, discoloration, crepitus  SKIN: clear, good turgor, color WNL, no rashes, lesions, or ulcerations Neuro: normal mental status, normal strength, sensation, and motion Psych: flat affect  All labs reviewed with patient.  Lipids:    Component Value Date/Time   CHOL 248 (H) 10/14/2015 1531   TRIG 217.0 (H) 10/14/2015 1531   HDL 37.10 (L) 10/14/2015 1531   LDLDIRECT 175.0 10/14/2015 1531   VLDL 43.4 (H) 10/14/2015 1531   CHOLHDL 7 10/14/2015 1531   CBC: CBC Latest Ref Rng & Units 10/14/2015 10/14/2014 10/10/2013  WBC 4.0 - 10.5 K/uL 8.2 8.0 7.7  Hemoglobin 13.0 - 17.0 g/dL 14.5 14.7 14.7  Hematocrit 39.0 - 52.0 % 44.5 44.8 44.8  Platelets 150.0 - 400.0 K/uL 255.0 300.0 701.7    Basic Metabolic Panel:    Component Value Date/Time   NA 141 10/14/2015 1531   K 3.8 10/14/2015 1531   CL 104 10/14/2015 1531   CO2 28 10/14/2015 1531   BUN 11 10/14/2015 1531   CREATININE 1.02 10/14/2015 1531   GLUCOSE 122 (H) 10/14/2015 1531   CALCIUM 9.7 10/14/2015 1531   Hepatic Function Latest Ref Rng & Units 10/14/2015 10/14/2014 10/10/2013  Total Protein 6.0 - 8.3 g/dL 7.4 7.5 7.4  Albumin 3.5 - 5.2 g/dL 4.3 4.3 4.0  AST 0 - 37 U/L _0 ALT 0 - 53 U/L  _1 Alk Phosphatase 39 - 117 U/L 68 70 63  Total Bilirubin 0.2 - 1.2 mg/dL 0.5 0.4 1.2  Bilirubin, Direct 0.0 - 0.3 mg/dL 0.1 0.1 -    Lab Results  Component Value Date   TSH 6.85 (H) 10/14/2015   Lab Results  Component Value Date   PSA 0.42 10/14/2015   PSA 0.45 10/14/2014   PSA 0.51 10/10/2013    Assessment and Plan:   Healthcare maintenance - Plan: Lipid panel, Basic metabolic panel, CBC with Differential/Platelet, Hepatic function panel, PSA  Other specified hypothyroidism - Plan: TSH, T3, free, T4, free  Hyperlipidemia LDL goal <100  Family history of prostate cancer  Other fatigue - Plan: Testos,Total,Free and SHBG (Male)  Depression, major, single episode, moderate ([place]): In addition to time spent with CPE components, >10 minutes spent  in face to face time with patient, >50% spent in counselling or coordination of care: has had anhedonia for months, now with sadness, acute stress and grief from ex-wife's recent marriage. No si or hi. Poor sleep.   Health Maintenance Exam: The patient's preventative maintenance and recommended screening tests for an annual wellness exam were reviewed in full today. Brought up to date unless services declined.  Counselled on the importance of diet, exercise, and its role in overall health and mortality. The patient's FH and SH was reviewed, including their home life, tobacco status, and drug and alcohol status.  Follow-up in 1 year for physical exam or additional follow-up below.  Follow-up: Return in about 6 weeks (around 01/26/2017). Or follow-up in 1 year if not noted.  Meds ordered this encounter  Medications  . escitalopram (LEXAPRO) 10 MG tablet    Sig: Take 1 tablet (10 mg total) by mouth daily.    Dispense:  30 tablet    Refill:  2   Orders Placed This Encounter  Procedures  . Lipid panel  . Basic metabolic panel  . CBC with Differential/Platelet  . Hepatic function panel  . PSA  . TSH  . T3, free  . T4,  free  . Testos,Total,Free and SHBG (Male)    Signed,  Frederico Hamman T. Chibueze Beasley, MD   Allergies as of 12/15/2016      Reactions   Latex Itching      Medication List       Accurate as of 12/15/16  9:29 AM. Always use your most recent med list.          diclofenac 75 MG EC tablet Commonly known as:  VOLTAREN Take 75 mg by mouth 2 (two) times daily.   escitalopram 10 MG tablet Commonly known as:  LEXAPRO Take 1 tablet (10 mg total) by mouth daily.   levothyroxine 75 MCG tablet Commonly known as:  SYNTHROID, LEVOTHROID TAKE 1 TABLET (75 MCG TOTAL) BY MOUTH DAILY BEFORE BREAKFAST. REPORTED ON 10/15/2015   tiZANidine 4 MG tablet Commonly known as:  ZANAFLEX TAKE 1 TABLET (4 MG TOTAL) BY MOUTH NIGHTLY.

## 2016-12-19 ENCOUNTER — Telehealth: Payer: Self-pay | Admitting: *Deleted

## 2016-12-19 ENCOUNTER — Encounter: Payer: Self-pay | Admitting: *Deleted

## 2016-12-19 LAB — TESTOS,TOTAL,FREE AND SHBG (FEMALE)
Sex Hormone Binding Glob.: 24 nmol/L (ref 10–50)
Testosterone, Free: 41.9 pg/mL (ref 35.0–155.0)
Testosterone,Total,LC/MS/MS: 293 ng/dL (ref 250–1100)

## 2016-12-19 MED ORDER — LEVOTHYROXINE SODIUM 88 MCG PO TABS: 88.0000 ug | ORAL_TABLET | Freq: Every day | ORAL | 3 refills | Status: DC

## 2016-12-19 NOTE — Telephone Encounter (Signed)
Mr. Steve Ashley notified as instructed by telephone.  He states he has not missed any doses of his thyroid medication.  New Rx for Levothyroxine 88 mcg sent into [facility] on [address] as instructed by Dr. Patsy Lageropland.  Lipid results also given to patient.  Letter with results also mailed to patient for his records.

## 2016-12-19 NOTE — Telephone Encounter (Signed)
-----   Message from Hannah BeatSpencer [name], MD sent at 12/16/2016  5:32 PM EDT ----- Please call:  Did he miss any of his thyroid medication in the last week?  His TSH was up somewhat.  If he did miss doses, then do not worry about it and try not to miss doses. If he did not miss any doses, then I am going to suggest that we go up to the next highest dosage for his Synthroid.  88 micrograms, 1 po daily, 90, 3 ref  Give cholesterol numbers.  His testosterone numbers will not come back until the middle of next week.  I will be out of town throughout that time, so there will be some delay in getting that information to him.

## 2016-12-20 ENCOUNTER — Other Ambulatory Visit: Payer: Self-pay | Admitting: Family Medicine

## 2016-12-20 NOTE — Telephone Encounter (Signed)
Last office visit 12/15/2016.  Last refilled 10/17/2016 for #30 with 1 refill.  Ok to refill?

## 2016-12-27 ENCOUNTER — Telehealth: Payer: Self-pay | Admitting: *Deleted

## 2016-12-27 NOTE — Telephone Encounter (Signed)
Patient states that since starting the antidepressant medication, he just hasn't felt himself, ie. Very flattened out and non-emotional over anything.  Please advise.

## 2016-12-28 NOTE — Telephone Encounter (Signed)
Have him cut back the dose to 1/2 a tablet a day and take it 1/2 a tablet a day for 2 weeks, then increase to 1 full tablet a day.

## 2016-12-28 NOTE — Telephone Encounter (Signed)
Mr. Steve Ashley notified as instructed by telephone.

## 2017-01-26 ENCOUNTER — Ambulatory Visit (INDEPENDENT_AMBULATORY_CARE_PROVIDER_SITE_OTHER): Payer: BC Managed Care – PPO | Admitting: Family Medicine

## 2017-01-26 VITALS — BP 138/90 | HR 83 | Temp 98.5°F | Ht 70.5 in | Wt 348.5 lb

## 2017-01-26 DIAGNOSIS — F321 Major depressive disorder, single episode, moderate: Secondary | ICD-10-CM | POA: Diagnosis not present

## 2017-01-26 NOTE — Progress Notes (Signed)
   Dr. Karleen HampshireSpencer T. Melford Tullier, MD, [facility] [address] [place], [address] Phone: [phone] Fax: [phone]  01/26/2017  Patient: Steve FaceDarryl B Dilling, MRN: [id number], DOB: 02/10/1966, 51 y.o.  Primary Physician:  Hannah Beatopland, Storie Heffern, MD   Chief Complaint  Patient presents with  . Follow-up    depression     >15 minutes spent in Ashley to Ashley time with patient, >50% spent in counselling or coordination of care   F/u dep  Decreased sleep. Sleeping only 3 hours. No improvement with anhedonia. Increased to 1 full tab after another 9-10 days.  He is having a lot of stress and issues at work.  He had a poor performance evaluation and he is in corrective action with the school system.  He is very stressed out about this. He is also still stressed out of his ex-wife getting remarried.  He was divorced about 5 or 6 years ago. He has no suicidality or homicidality.  He did have some difficulty with his initial dose of Lexapro 10 mg, so I had him decrease this to 5 mg, and he just increased this back up to 10 mg only about 2 weeks ago or so.  Continue with current Lexapro dose, continue with counseling, encouraged activity, and I would like to have him follow-up with me again in 6 weeks to check on his situation.  Working with [facility].  Signed,  Elpidio GaleaSpencer T. Narada Uzzle, MD

## 2017-01-27 ENCOUNTER — Encounter: Payer: Self-pay | Admitting: Family Medicine

## 2017-02-02 ENCOUNTER — Other Ambulatory Visit: Payer: Self-pay | Admitting: Family Medicine

## 2017-03-09 ENCOUNTER — Ambulatory Visit (INDEPENDENT_AMBULATORY_CARE_PROVIDER_SITE_OTHER): Payer: BC Managed Care – PPO | Admitting: Family Medicine

## 2017-03-09 ENCOUNTER — Encounter: Payer: Self-pay | Admitting: Family Medicine

## 2017-03-09 VITALS — BP 130/80 | HR 85 | Temp 98.4°F | Ht 70.5 in | Wt 344.5 lb

## 2017-03-09 DIAGNOSIS — F321 Major depressive disorder, single episode, moderate: Secondary | ICD-10-CM | POA: Diagnosis not present

## 2017-03-09 MED ORDER — ESCITALOPRAM OXALATE 20 MG PO TABS: 20.0000 mg | ORAL_TABLET | Freq: Every day | ORAL | 1 refills | Status: DC

## 2017-03-09 NOTE — Progress Notes (Signed)
   Dr. Karleen Hampshire T. Eleina Jergens, MD, [facility] [address] [place], [address] Phone: [phone] Fax: [phone]  03/09/2017  Patient: Steve Ashley, MRN: [id number], DOB: Apr 15, 1966, 51 y.o.  Primary Physician:  Hannah Beat, MD   Chief Complaint  Patient presents with  . Follow-up    Depression   Subjective:   Steve Ashley is a 51 y.o. very pleasant male patient who presents with the following:  >15 minutes spent in face to face time with patient, >50% spent in counselling or coordination of care   F/u depression. Work issues.  Lost some weight.  Sons relationship is good.  Sleep: 7 Still with some anhedonia, decreased interest in activities Energy is sluggish No si or hi. Some guilt.   Some joint pains.   Overall he is doing better, he still sluggish and has anhedonia. He overall appears to be like he is depressed. My recommendation would be for him to increase his medication.  Depression, major, single episode, moderate ([facility])  Follow-up: Return in about 6 weeks (around 04/20/2017).  Future Appointments Date Time Provider Department Center  04/24/2017 3:15 PM Arlayne Liggins, Karleen Hampshire, MD [facility]    Meds ordered this encounter  Medications  . escitalopram (LEXAPRO) 20 MG tablet    Sig: Take 1 tablet (20 mg total) by mouth daily.    Dispense:  90 tablet    Refill:  1   Medications Discontinued During This Encounter  Medication Reason  . escitalopram (LEXAPRO) 10 MG tablet Reorder   Signed,  Karleen Hampshire T. Lissie Hinesley, MD   Allergies as of 03/09/2017      Reactions   Latex Itching      Medication List       Accurate as of 03/09/17  2:32 PM. Always use your most recent med list.          diclofenac 75 MG EC tablet Commonly known as:  VOLTAREN Take 75 mg by mouth 2 (two) times daily.   escitalopram 20 MG tablet Commonly known as:  LEXAPRO Take 1 tablet (20 mg total) by mouth daily.   levothyroxine 88 MCG tablet Commonly known as:  SYNTHROID, LEVOTHROID Take 1 tablet (88 mcg total) by mouth daily.   tiZANidine 4 MG tablet Commonly known as:  ZANAFLEX TAKE 1 TABLET (4 MG TOTAL) BY MOUTH NIGHTLY.

## 2017-04-24 ENCOUNTER — Ambulatory Visit: Payer: BC Managed Care – PPO | Admitting: Family Medicine

## 2017-04-24 ENCOUNTER — Other Ambulatory Visit: Payer: Self-pay

## 2017-04-24 ENCOUNTER — Encounter: Payer: Self-pay | Admitting: Family Medicine

## 2017-04-24 VITALS — BP 138/82 | HR 67 | Temp 97.8°F | Ht 70.5 in | Wt 347.5 lb

## 2017-04-24 DIAGNOSIS — F321 Major depressive disorder, single episode, moderate: Secondary | ICD-10-CM | POA: Diagnosis not present

## 2017-04-24 NOTE — Progress Notes (Signed)
   Dr. Karleen HampshireSpencer T. Shammond Arave, MD, [facility] [address] [place], [address] Phone: [phone] Fax: [phone]  04/24/2017  Patient: Steve [name], MRN: [id number], DOB: 09/15/1965, [age] y.o.  Primary Physician:  Hannah Beatopland, Steve Gomillion, MD   Chief Complaint  Patient presents with  . Follow-up    Depression    >15 minutes spent in Ashley to Ashley time with patient, >50% spent in counselling or coordination of care   Basketball has started. Good thanksgiving. Has some challenging players. Step son came and visited. Son is doing ok and has a younger child, too. Sleeping ok. Doesn't have much time for gaming now which he likes.   He is improved compared to before, but he still was mildly down.  He still is having recurrent thoughts about his ex-wife and her marriage which was more than a year ago.  He still getting along well with his children.  She had some difficulty at work with his boss.  Not having any thoughts of self-harm.  Globally is improved somewhat, and does not really want to do any type of counseling at all.  Family have him follow-up only on an as-needed basis until his physical next year or otherwise if he worsens.  Signed,  Elpidio GaleaSpencer T. Kylinn Shropshire, MD

## 2017-06-07 ENCOUNTER — Encounter: Payer: Self-pay | Admitting: Family Medicine

## 2017-06-07 ENCOUNTER — Other Ambulatory Visit: Payer: Self-pay

## 2017-06-07 ENCOUNTER — Ambulatory Visit: Payer: BC Managed Care – PPO | Admitting: Family Medicine

## 2017-06-07 VITALS — BP 140/96 | HR 85 | Temp 97.5°F | Ht 70.5 in | Wt 351.8 lb

## 2017-06-07 DIAGNOSIS — M25462 Effusion, left knee: Secondary | ICD-10-CM | POA: Diagnosis not present

## 2017-06-07 DIAGNOSIS — M25461 Effusion, right knee: Secondary | ICD-10-CM | POA: Diagnosis not present

## 2017-06-07 DIAGNOSIS — M353 Polymyalgia rheumatica: Secondary | ICD-10-CM | POA: Diagnosis not present

## 2017-06-07 DIAGNOSIS — M2550 Pain in unspecified joint: Secondary | ICD-10-CM | POA: Diagnosis not present

## 2017-06-07 DIAGNOSIS — M659 Unspecified synovitis and tenosynovitis, unspecified site: Secondary | ICD-10-CM

## 2017-06-07 DIAGNOSIS — M25539 Pain in unspecified wrist: Secondary | ICD-10-CM

## 2017-06-07 NOTE — Progress Notes (Signed)
Dr. Karleen HampshireSpencer T. Demarri Elie, MD, [facility] [address] [place], [address] Phone: [phone] Fax: [phone]  06/07/2017  Patient: Steve [name], MRN: [id number], DOB: 02/03/1966, 52 y.o.  Primary Physician:  Steve Ashley, Steve Rylander, MD   Chief Complaint  Patient presents with  . Pain in Joints    Hips, Knees,  Right Shoulder   Subjective:   Steve Ashley is a 52 y.o. 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  He does have a significant right shoulder injury approximately 30 years ago, and right now he is having a difficult time abducting his right shoulder, but he has not had any new trauma to this area in many years.  He is not actively throwing a baseball or doing other things with the affected arm and shoulder.  He has no known rheumatological disorder.  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  Both knees, both hips and R shoulder. Feels like on fire.   Old injury to the R shoulder. 30 years ago.  Hard to abduct.   Now 2 weeks ago, new pain with hips and knees.  Also having some pain with the hands and wrists that is also new.     Past Medical History, Surgical History, Social History, Family History, Problem List, Medications, and Allergies have been reviewed and updated if relevant.  Patient Active Problem List   Diagnosis Date Noted  . Depression, major, single episode, moderate ([facility]) 03/09/2017  . Family history of prostate cancer  10/10/2013  . Severe obesity (BMI >= 40) ([facility]) 05/09/2013  . OSA (obstructive sleep apnea) 08/29/2012  . Hypothyroidism 01/06/2010  . Hyperlipidemia LDL goal <100 01/06/2010  . HALLUX RIGIDUS 07/20/2009    Past Medical History:  Diagnosis Date  . Ankle fracture yrs ago   left   . Arthritis   . Asthma     hx of childhood asthma, none current  . Hyperlipidemia   . Hypothyroidism   . Morbid obesity 08/10/2012  . Morbid obesity ([facility])   . Syndesmotic ankle sprain yrs ago    Past Surgical History:  Procedure Laterality Date  . ANKLE SURGERY  age 52   left ankle   . KNEE ARTHROSCOPY  yrs ago   right  . THUMB ARTHROSCOPY  age 52   left   . VENTRAL HERNIA REPAIR  12/08/2011   Procedure: LAPAROSCOPIC VENTRAL HERNIA;  Surgeon: Atilano InaEric M Wilson, MD,FACS;  Location: [facility];  Service: General;  Laterality: N/A;    Social History   Socioeconomic History  . Marital status: Single    Spouse name: Not on file  . Number of children: Not on file  . Years of education: Not on file  . Highest education level: Not on file  Social Needs  . Financial resource strain: Not on file  . Food insecurity - worry: Not on file  . Food insecurity - inability: Not on file  . Transportation needs - medical: Not on file  . Transportation  needs - non-medical: Not on file  Occupational History  . Occupation: PE teacher  Tobacco Use  . Smoking status: Never Smoker  . Smokeless tobacco: Never Used  Substance and Sexual Activity  . Alcohol use: Yes    Alcohol/week: 0.0 oz    Comment: occassional; once monthly  . Drug use: No  . Sexual activity: Not on file  Other Topics Concern  . 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 Hypertension Mother   . Heart disease Neg Hx   . Colon cancer Neg Hx   . Cancer Father        prostate    Allergies    Allergen Reactions  . Latex Itching    Medication list reviewed and updated in full in [facility].  GEN: No fevers, chills. Nontoxic. Primarily MSK c/o today. MSK: Detailed in the HPI GI: tolerating PO intake without difficulty Neuro: No numbness, parasthesias, or tingling associated. Otherwise the pertinent positives of the ROS are noted above.   Objective:   BP (!) 140/96   Pulse 85   Temp (!) 97.5 F (36.4 C) (Oral)   Ht 5' 10.5" (1.791 m)   Wt (!) 351 lb 12 oz (159.6 kg)   BMI 49.76 kg/m    GEN: WDWN, NAD, Non-toxic, Alert & Oriented x 3 HEENT: Atraumatic, Normocephalic.  Ears and Nose: No external deformity. EXTR: No clubbing/cyanosis/edema NEURO: notably antalgic gait, difficulty rising from a seated position.  PSYCH: Normally interactive. Conversant. Not depressed or anxious appearing.  Calm demeanor.    Patient does have some mild synovitis on multiple MCP joints of both hands.  The wrists themselves have good range of motion.  The PIP and DIP joints appear to be normal.  There is some minor decrease in full flexion at the hand.  On the right side, the patient does have difficulty abducting the shoulder, but passively range of motion is full in all directions. Strength testing in all directions is 5/5 at the shoulder. Crossover is positive. Steve Ashley is positive on the right more than the left. There is pain with movement and rotational movements of the shoulder.  Bilateral knees with mild to moderate effusion.  There is full extension and in both knee approximately flexion to 80 degrees. There is pain on the medial and lateral joint lines. There is no warmth or redness.  Hips, bilateral, abduction is grossly full, but causes back pain.  With the hip flexed to 90 in both sides, there is approximately 25 degrees of total rotational movement, but terminal internal range of motion does cause some pain.  Radiology: Results for orders placed or performed  in visit on 06/07/17  Rheumatoid factor  Result Value Ref Range   Rhuematoid fact SerPl-aCnc <14 <14 IU/mL     Assessment and Plan:   Polyarthralgia - Plan: ANA, Cyclic citrul peptide antibody, IgG, Rheumatoid factor, High sensitivity CRP, Sedimentation rate, Uric acid, Anti-DNA antibody, double-stranded, CBC with Differential/Platelet  Synovitis - Plan: ANA, Cyclic citrul peptide antibody, IgG, Rheumatoid factor, High sensitivity CRP, Sedimentation rate, Uric acid, Anti-DNA antibody, double-stranded, CBC with Differential/Platelet  Bilateral knee effusions - Plan: ANA, Cyclic citrul peptide antibody, IgG, Rheumatoid factor, High sensitivity CRP, Sedimentation rate, Uric acid, Anti-DNA antibody, double-stranded, CBC with Differential/Platelet  Wrist pain, unspecified  laterality  2 weeks history of worsening pain.  Certainly the patient has some degenerative changes, but this is over and above what would typically be expected and unlike any prior presentation that I have seen with this patient.  Polymyalgia rheumatica would be in the differential, as well as other rheumatological disease.  We will obtain labs as above.  Treat accordingly.  Possibly involve rheumatology.  I will coordinate plan of care based on final results  Follow-up: No Follow-up on file.  Orders Placed This Encounter  Procedures  . ANA  . Cyclic citrul peptide antibody, IgG  . Rheumatoid factor  . High sensitivity CRP  . Sedimentation rate  . Uric acid  . Anti-DNA antibody, double-stranded  . CBC with Differential/Platelet    Signed,  Steve Hampshire T. Jaeshaun Riva, MD   Allergies as of 06/07/2017      Reactions   Latex Itching      Medication List        Accurate as of 06/07/17 11:59 PM. Always use your most recent med list.          diclofenac 75 MG EC tablet Commonly known as:  VOLTAREN Take 75 mg by mouth 2 (two) times daily.   escitalopram 20 MG tablet Commonly known as:  LEXAPRO Take 1 tablet (20  mg total) by mouth daily.   levothyroxine 88 MCG tablet Commonly known as:  SYNTHROID, LEVOTHROID Take 1 tablet (88 mcg total) by mouth daily.   tiZANidine 4 MG tablet Commonly known as:  ZANAFLEX TAKE 1 TABLET (4 MG TOTAL) BY MOUTH NIGHTLY.

## 2017-06-08 LAB — RHEUMATOID FACTOR: Rhuematoid fact SerPl-aCnc: <14 IU/mL (ref <14)

## 2017-06-08 LAB — CBC WITH DIFFERENTIAL/PLATELET
Basophils Absolute: 0.1 10*3/uL (ref 0.0–0.1)
Basophils Relative: 1.1 % (ref 0.0–3.0)
Eosinophils Absolute: 0.4 10*3/uL (ref 0.0–0.7)
Eosinophils Relative: 4.4 % (ref 0.0–5.0)
HCT: 44.8 % (ref 39.0–52.0)
Hemoglobin: 14.4 g/dL (ref 13.0–17.0)
Lymphocytes Relative: 32.3 % (ref 12.0–46.0)
Lymphs Abs: 2.7 10*3/uL (ref 0.7–4.0)
MCHC: 32.2 g/dL (ref 30.0–36.0)
MCV: 88.5 fl (ref 78.0–100.0)
Monocytes Absolute: 0.8 10*3/uL (ref 0.1–1.0)
Monocytes Relative: 9.7 % (ref 3.0–12.0)
Neutro Abs: 4.3 10*3/uL (ref 1.4–7.7)
Neutrophils Relative %: 52.5 % (ref 43.0–77.0)
Platelets: 277 10*3/uL (ref 150.0–400.0)
RBC: 5.07 Mil/uL (ref 4.22–5.81)
RDW: 14.7 % (ref 11.5–15.5)
WBC: 8.3 10*3/uL (ref 4.0–10.5)

## 2017-06-08 LAB — URIC ACID: Uric Acid, Serum: 7.2 mg/dL (ref 4.0–7.8)

## 2017-06-08 LAB — HIGH SENSITIVITY CRP: CRP, High Sensitivity: 10.98 mg/L — ABNORMAL HIGH (ref 0.000–5.000)

## 2017-06-08 LAB — ANA: Anti Nuclear Antibody(ANA): NEGATIVE

## 2017-06-08 LAB — CYCLIC CITRUL PEPTIDE ANTIBODY, IGG: Cyclic Citrullin Peptide Ab: <16 UNITS

## 2017-06-08 LAB — ANTI-DNA ANTIBODY, DOUBLE-STRANDED: ds DNA Ab: <1 IU/mL

## 2017-06-08 LAB — SEDIMENTATION RATE: Sed Rate: 48 mm/hr — ABNORMAL HIGH (ref 0–20)

## 2017-06-08 MED ORDER — PREDNISONE 5 MG PO TABS: 15.0000 mg | ORAL_TABLET | Freq: Every day | ORAL | 1 refills | Status: DC

## 2017-06-08 NOTE — Addendum Note (Signed)
Addended by: Hannah BeatOPLAND, Omari Koslosky on: 06/08/2017 06:19 PM   Modules accepted: Orders

## 2017-08-11 ENCOUNTER — Other Ambulatory Visit: Payer: Self-pay | Admitting: Family Medicine

## 2017-08-11 NOTE — Telephone Encounter (Signed)
Spoke to pt. He has not been to the rheumatologist. Appt 08-29-17. He said it really helps. He is also asking for refills of tizanidine and diclofenac

## 2017-08-11 NOTE — Telephone Encounter (Signed)
Last filled 06/08/17 with 1 refill.... Please advise

## 2017-08-11 NOTE — Telephone Encounter (Signed)
This is unusual. Has he not seen rheumatology? If not, when is his appointment?

## 2017-08-12 ENCOUNTER — Other Ambulatory Visit: Payer: Self-pay | Admitting: Family Medicine

## 2017-08-12 MED ORDER — TIZANIDINE HCL 4 MG PO TABS: 4.0000 mg | ORAL_TABLET | Freq: Every evening | ORAL | 1 refills | Status: DC

## 2017-08-12 NOTE — Telephone Encounter (Signed)
Decline diclofenac - should not take with prednisone.  I will refill zanaflex.

## 2017-08-14 NOTE — Telephone Encounter (Signed)
Akshath notified as instructed by telephone.

## 2017-09-04 ENCOUNTER — Other Ambulatory Visit: Payer: Self-pay | Admitting: Family Medicine

## 2017-10-12 ENCOUNTER — Encounter: Payer: Self-pay | Admitting: Urgent Care

## 2017-10-12 ENCOUNTER — Ambulatory Visit (INDEPENDENT_AMBULATORY_CARE_PROVIDER_SITE_OTHER): Payer: Self-pay | Admitting: Urgent Care

## 2017-10-12 VITALS — BP 136/80 | HR 89 | Temp 97.3°F | Resp 18 | Ht 70.5 in | Wt 342.4 lb

## 2017-10-12 DIAGNOSIS — Z024 Encounter for examination for driving license: Secondary | ICD-10-CM

## 2017-10-12 DIAGNOSIS — M353 Polymyalgia rheumatica: Secondary | ICD-10-CM

## 2017-10-12 DIAGNOSIS — M48061 Spinal stenosis, lumbar region without neurogenic claudication: Secondary | ICD-10-CM

## 2017-10-12 DIAGNOSIS — E038 Other specified hypothyroidism: Secondary | ICD-10-CM

## 2017-10-12 DIAGNOSIS — R319 Hematuria, unspecified: Secondary | ICD-10-CM

## 2017-10-12 DIAGNOSIS — Z9889 Other specified postprocedural states: Secondary | ICD-10-CM

## 2017-10-12 NOTE — Progress Notes (Signed)
    Airline pilot Medical Examination   Steve Ashley is a 52 y.o. male who presents today for a DOT physical exam. The patient reports history of back surgery, knee surgery.  Reports that he is without sequelae, got cleared to work out again.  Patient has a history of rheumatoid arthritis and hypothyroidism.  Is on medical therapy for both of these conditions. Denies dizziness, chronic headache, blurred vision, chest pain, shortness of breath, heart racing, palpitations, nausea, vomiting, abdominal pain, hematuria, lower leg swelling. Denies smoking cigarettes or drinking alcohol.   The following portions of the patient's history were reviewed and updated as appropriate: allergies, current medications, past family history, past medical history, past social history and past surgical history.  Objective:   BP 136/80   Pulse 89   Temp (!) 97.3 F (36.3 C) (Oral)   Resp 18   Ht 5' 10.5" (1.791 m)   Wt (!) 342 lb 6.4 oz (155.3 kg)   SpO2 98%   BMI 48.43 kg/m   Vision/hearing:  Visual Acuity Screening   Right eye Left eye Both eyes  Without correction: 20/30-2 20/40 20/25-1  With correction:     Comments: Peripheral Vision: Right eye 85 degrees. Left eye 85 degrees.  The patient can distinguish the colors red, amber and green.  Hearing Screening Comments: The patient was able to hear a forced whisper from 10 feet.  Patient can recognize and distinguish among traffic control signals and devices showing standard red, green, and amber colors.  Corrective lenses required: No  Monocular Vision?: No  Hearing aid requirement: No  Physical Exam  Constitutional: He is oriented to person, place, and time. He appears well-developed and well-nourished.  HENT:  TM's intact bilaterally, no effusions or erythema. Nasal turbinates pink and moist, nasal passages patent. No sinus tenderness. Oropharynx clear, mucous membranes moist, dentition in good repair.  Eyes: Pupils are equal,  round, and reactive to light. Conjunctivae and EOM are normal. Right eye exhibits no discharge. Left eye exhibits no discharge. No scleral icterus.  Neck: Normal range of motion. Neck supple.  Cardiovascular: Normal rate, regular rhythm and intact distal pulses. Exam reveals no gallop and no friction rub.  No murmur heard. Pulmonary/Chest: No stridor. No respiratory distress. He has no wheezes. He has no rales.  Abdominal: Soft. Bowel sounds are normal. He exhibits no distension and no mass. There is no tenderness. There is no rebound and no guarding.  Diastasis recti present.  Musculoskeletal: Normal range of motion. He exhibits no edema or tenderness.  Well-healed vertical surgical scar over low back, lumbar region.  Lymphadenopathy:    He has no cervical adenopathy.  Neurological: He is alert and oriented to person, place, and time. He has normal reflexes. He displays normal reflexes. Coordination normal.  Skin: Skin is warm and dry. No rash noted. No erythema. No pallor.  Psychiatric: He has a normal mood and affect.    Labs: Comments: Urine Specimen:  SpGr:  1.025   Protein:  Trace   Blood:  Moderate   Sugar:  Negative  Assessment:    Healthy male exam.  Meets standards in 76 CFR 391.41;  qualifies for 2 year certificate.    Plan:   Medical examiners certificate completed and printed. Return as needed.  He is to follow-up with his PCP regarding hematuria  Wallis Bamberg, PA-C Primary Care at [facility] [phone] 10/12/2017  9:05 AM

## 2017-10-12 NOTE — Patient Instructions (Signed)
     IF you received an x-ray today, you will receive an invoice from [facility]. Please contact [facility] at [phone] with questions or concerns regarding your invoice.   IF you received labwork today, you will receive an invoice from [facility]. Please contact [facility] at [phone] with questions or concerns regarding your invoice.   Our billing staff will not be able to assist you with questions regarding bills from these companies.  You will be contacted with the lab results as soon as they are available. The fastest way to get your results is to activate your My Chart account. Instructions are located on the last page of this paperwork. If you have not heard from us regarding the results in 2 weeks, please contact this office.     

## 2017-12-02 ENCOUNTER — Other Ambulatory Visit: Payer: Self-pay | Admitting: Family Medicine

## 2017-12-04 NOTE — Telephone Encounter (Signed)
Last office visit 06/07/2017.  Last refilled 08/12/2017 for #30 with 1 refill.  Ok to refill?

## 2017-12-25 ENCOUNTER — Other Ambulatory Visit: Payer: Self-pay | Admitting: Family Medicine

## 2017-12-25 ENCOUNTER — Other Ambulatory Visit (INDEPENDENT_AMBULATORY_CARE_PROVIDER_SITE_OTHER): Payer: BC Managed Care – PPO

## 2017-12-25 DIAGNOSIS — Z0000 Encounter for general adult medical examination without abnormal findings: Secondary | ICD-10-CM

## 2017-12-25 DIAGNOSIS — E039 Hypothyroidism, unspecified: Secondary | ICD-10-CM

## 2017-12-25 LAB — BASIC METABOLIC PANEL
BUN: 15 mg/dL (ref 6–23)
CO2: 27 mEq/L (ref 19–32)
Calcium: 9.4 mg/dL (ref 8.4–10.5)
Chloride: 104 mEq/L (ref 96–112)
Creatinine, Ser: 1.02 mg/dL (ref 0.40–1.50)
GFR: 98.16 mL/min (ref >60.00)
Glucose, Bld: 94 mg/dL (ref 70–99)
Potassium: 4 mEq/L (ref 3.5–5.1)
Sodium: 141 mEq/L (ref 135–145)

## 2017-12-25 LAB — LIPID PANEL
Cholesterol: 239 mg/dL — ABNORMAL HIGH (ref 0–200)
HDL: 40.4 mg/dL (ref >39.00)
LDL Cholesterol: 176 mg/dL — ABNORMAL HIGH (ref 0–99)
NonHDL: 198.63
Total CHOL/HDL Ratio: 6
Triglycerides: 115 mg/dL (ref 0.0–149.0)
VLDL: 23 mg/dL (ref 0.0–40.0)

## 2017-12-25 LAB — CBC WITH DIFFERENTIAL/PLATELET
Basophils Absolute: 0 10*3/uL (ref 0.0–0.1)
Basophils Relative: 0.6 % (ref 0.0–3.0)
Eosinophils Absolute: 0.2 10*3/uL (ref 0.0–0.7)
Eosinophils Relative: 3.4 % (ref 0.0–5.0)
HCT: 43 % (ref 39.0–52.0)
Hemoglobin: 14.1 g/dL (ref 13.0–17.0)
Lymphocytes Relative: 27.8 % (ref 12.0–46.0)
Lymphs Abs: 2 10*3/uL (ref 0.7–4.0)
MCHC: 32.8 g/dL (ref 30.0–36.0)
MCV: 87.1 fl (ref 78.0–100.0)
Monocytes Absolute: 0.7 10*3/uL (ref 0.1–1.0)
Monocytes Relative: 10.1 % (ref 3.0–12.0)
Neutro Abs: 4.1 10*3/uL (ref 1.4–7.7)
Neutrophils Relative %: 58.1 % (ref 43.0–77.0)
Platelets: 260 10*3/uL (ref 150.0–400.0)
RBC: 4.94 Mil/uL (ref 4.22–5.81)
RDW: 14.6 % (ref 11.5–15.5)
WBC: 7 10*3/uL (ref 4.0–10.5)

## 2017-12-25 LAB — HEPATIC FUNCTION PANEL
ALT: 16 U/L (ref 0–53)
AST: 14 U/L (ref 0–37)
Albumin: 4.1 g/dL (ref 3.5–5.2)
Alkaline Phosphatase: 72 U/L (ref 39–117)
Bilirubin, Direct: 0.1 mg/dL (ref 0.0–0.3)
Total Bilirubin: 0.5 mg/dL (ref 0.2–1.2)
Total Protein: 7.2 g/dL (ref 6.0–8.3)

## 2017-12-25 LAB — T4, FREE: Free T4: 1.03 ng/dL (ref 0.60–1.60)

## 2017-12-25 LAB — T3, FREE: T3, Free: 3.6 pg/mL (ref 2.3–4.2)

## 2017-12-25 LAB — PSA: PSA: 0.56 ng/mL (ref 0.10–4.00)

## 2017-12-25 LAB — TSH: TSH: 3.3 u[IU]/mL (ref 0.35–4.50)

## 2017-12-28 ENCOUNTER — Ambulatory Visit (INDEPENDENT_AMBULATORY_CARE_PROVIDER_SITE_OTHER): Payer: BC Managed Care – PPO | Admitting: Family Medicine

## 2017-12-28 ENCOUNTER — Other Ambulatory Visit: Payer: Self-pay

## 2017-12-28 ENCOUNTER — Encounter: Payer: Self-pay | Admitting: Family Medicine

## 2017-12-28 VITALS — BP 110/82 | HR 68 | Temp 97.9°F | Ht 70.5 in | Wt 341.5 lb

## 2017-12-28 DIAGNOSIS — Z0000 Encounter for general adult medical examination without abnormal findings: Secondary | ICD-10-CM

## 2017-12-28 MED ORDER — NEOMYCIN-POLYMYXIN-HC 3.5-10000-1 OT SOLN: 3.0000 [drp] | Freq: Four times a day (QID) | OTIC | 0 refills | Status: DC

## 2017-12-28 NOTE — Progress Notes (Signed)
Dr. Frederico Hamman T. [name], MD, [facility] [place], [address] Phone: [phone] Fax: [phone]  12/28/2017  Patient: Steve Ashley, MRN: [id number], DOB: Apr 01, 1966, 52 y.o.  Primary Physician:  Owens Loffler, MD   Chief Complaint  Patient presents with  . Annual Exam   Subjective:   Steve Ashley is a 52 y.o. pleasant patient who presents with the following:  Preventative Health Maintenance Visit:  Health Maintenance Summary Reviewed and updated, unless pt declines services.  Tobacco History Reviewed. Alcohol: No concerns, no excessive use Exercise Habits: Some activity, rec at least 30 mins 5 times a week STD concerns: no risk or activity to increase risk Drug Use: None Encouraged self-testicular check  Swimmer's ear or ear infection.  PR, took prednisone for 6 months and tapered off.    Health Maintenance  Topic Date Due  . INFLUENZA VACCINE  12/28/2017  . COLONOSCOPY  12/17/2024  . TETANUS/TDAP  10/18/2025  . HIV Screening  Completed   Immunization History  Administered Date(s) Administered  . Tdap 10/19/2015   Patient Active Problem List   Diagnosis Date Noted  . Depression, major, single episode, moderate ([place]) 03/09/2017  . Family history of prostate cancer 10/10/2013  . Severe obesity (BMI >= 40) ([place]) 05/09/2013  . OSA (obstructive sleep apnea) 08/29/2012  . Hypothyroidism 01/06/2010  . Hyperlipidemia LDL goal <100 01/06/2010  . HALLUX RIGIDUS 07/20/2009   Past Medical History:  Diagnosis Date  . Ankle fracture yrs ago   left   . Arthritis   . Asthma     hx of childhood asthma, none current  . Hyperlipidemia   . Hypothyroidism   . Morbid obesity 08/10/2012  . Morbid obesity ([place])   . Syndesmotic ankle sprain yrs ago   Past Surgical History:  Procedure Laterality Date  . ANKLE SURGERY  age 42   left ankle   . KNEE ARTHROSCOPY  yrs ago   right  . THUMB ARTHROSCOPY   age 13   left   . VENTRAL HERNIA REPAIR  12/08/2011   Procedure: LAPAROSCOPIC VENTRAL HERNIA;  Surgeon: Gayland Curry, MD,FACS;  Location: [facility];  Service: General;  Laterality: N/A;   Social History   Socioeconomic History  . Marital status: Single    Spouse name: Not on file  . Number of children: Not on file  . Years of education: Not on file  . Highest education level: Not on file  Occupational History  . Occupation: PE teacher  Social Needs  . Financial resource strain: Not on file  . Food insecurity:    Worry: Not on file    Inability: Not on file  . Transportation needs:    Medical: Not on file    Non-medical: Not on file  Tobacco Use  . Smoking status: Never Smoker  . Smokeless tobacco: Never Used  Substance and Sexual Activity  . Alcohol use: Yes    Alcohol/week: 0.0 oz    Comment: occassional; once monthly  . Drug use: No  . Sexual activity: Not on file  Lifestyle  . Physical activity:    Days per week: Not on file    Minutes per session: Not on file  . Stress: Not on file  Relationships  . Social connections:    Talks on phone: Not on file    Gets together: Not on file    Attends religious service: Not on file    Active member  of club or organization: Not on file    Attends meetings of clubs or organizations: Not on file    Relationship status: Not on file  . Intimate partner violence:    Fear of current or ex partner: Not on file    Emotionally abused: Not on file    Physically abused: Not on file    Forced sexual activity: Not on file  Other Topics Concern  . 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 Hypertension Mother   . Cancer Father        prostate  . Heart disease Neg Hx   . Colon cancer Neg Hx    Allergies  Allergen Reactions  . Latex Itching    Medication list has been  reviewed and updated.   General: Denies fever, chills, sweats. No significant weight loss. Eyes: Denies blurring,significant itching ENT: Denies earache, sore throat, and hoarseness. Cardiovascular: Denies chest pains, palpitations, dyspnea on exertion Respiratory: Denies cough, dyspnea at rest,wheeezing Breast: no concerns about lumps GI: Denies nausea, vomiting, diarrhea, constipation, change in bowel habits, abdominal pain, melena, hematochezia GU: Denies penile discharge, ED, urinary flow / outflow problems. No STD concerns. Musculoskeletal: Denies back pain, joint pain Derm: Denies rash, itching Neuro: Denies  paresthesias, frequent falls, frequent headaches Psych: Denies depression, anxiety Endocrine: Denies cold intolerance, heat intolerance, polydipsia Heme: Denies enlarged lymph nodes Allergy: No hayfever  Objective:   BP 110/82   Pulse 68   Temp 97.9 F (36.6 C) (Oral)   Ht 5' 10.5" (1.791 m)   Wt (!) 341 lb 8 oz (154.9 kg)   BMI 48.31 kg/m  Ideal Body Weight: Weight in (lb) to have BMI = 25: 176.4  No exam data present  GEN: well developed, well nourished, no acute distress Eyes: conjunctiva and lids normal, PERRLA, EOMI ENT: TM clear, nares clear, oral exam WNL, L OE Neck: supple, no lymphadenopathy, no thyromegaly, no JVD Pulm: clear to auscultation and percussion, respiratory effort normal CV: regular rate and rhythm, S1-S2, no murmur, rub or gallop, no bruits, peripheral pulses normal and symmetric, no cyanosis, clubbing, edema or varicosities GI: soft, non-tender; no hepatosplenomegaly, masses; active bowel sounds all quadrants GU: no hernia, testicular mass, penile discharge Lymph: no cervical, axillary or inguinal adenopathy MSK: gait normal, muscle tone and strength WNL, no joint swelling, effusions, discoloration, crepitus  SKIN: clear, good turgor, color WNL, no rashes, lesions, or ulcerations Neuro: normal mental status, normal strength, sensation,  and motion Psych: alert; oriented to person, place and time, normally interactive and not anxious or depressed in appearance. All labs reviewed with patient.  Lipids:    Component Value Date/Time   CHOL 239 (H) 12/25/2017 1004   TRIG 115.0 12/25/2017 1004   HDL 40.40 12/25/2017 1004   LDLDIRECT 175.0 10/14/2015 1531   VLDL 23.0 12/25/2017 1004   CHOLHDL 6 12/25/2017 1004   CBC: CBC Latest Ref Rng & Units 12/25/2017 06/07/2017 12/15/2016  WBC 4.0 - 10.5 K/uL 7.0 8.3 6.1  Hemoglobin 13.0 - 17.0 g/dL 14.1 14.4 14.8  Hematocrit 39.0 - 52.0 % 43.0 44.8 45.7  Platelets 150.0 - 400.0 K/uL 260.0 277.0 509.3    Basic Metabolic Panel:    Component Value Date/Time   NA 141 12/25/2017 1004   K 4.0 12/25/2017 1004  CL 104 12/25/2017 1004   CO2 27 12/25/2017 1004   BUN 15 12/25/2017 1004   CREATININE 1.02 12/25/2017 1004   GLUCOSE 94 12/25/2017 1004   CALCIUM 9.4 12/25/2017 1004   Hepatic Function Latest Ref Rng & Units 12/25/2017 12/15/2016 10/14/2015  Total Protein 6.0 - 8.3 g/dL 7.2 7.3 7.4  Albumin 3.5 - 5.2 g/dL 4.1 4.2 4.3  AST 0 - 37 U/L _0 ALT 0 - 53 U/L _1 Alk Phosphatase 39 - 117 U/L 72 71 68  Total Bilirubin 0.2 - 1.2 mg/dL 0.5 0.5 0.5  Bilirubin, Direct 0.0 - 0.3 mg/dL 0.1 0.1 0.1    Lab Results  Component Value Date   TSH 3.30 12/25/2017   Lab Results  Component Value Date   PSA 0.56 12/25/2017   PSA 0.56 12/15/2016   PSA 0.42 10/14/2015    Assessment and Plan:   Routine general medical examination at a health care facility  Drops for L OE  Health Maintenance Exam: The patient's preventative maintenance and recommended screening tests for an annual wellness exam were reviewed in full today. Brought up to date unless services declined.  Counselled on the importance of diet, exercise, and its role in overall health and mortality. The patient's FH and SH was reviewed, including their home life, tobacco status, and drug and alcohol  status.  Follow-up in 1 year for physical exam or additional follow-up below.  Follow-up: No follow-ups on file. Or follow-up in 1 year if not noted.  Signed,  Maud Deed. Mende Biswell, MD   Allergies as of 12/28/2017      Reactions   Latex Itching      Medication List        Accurate as of 12/28/17 10:47 AM. Always use your most recent med list.          diclofenac 75 MG EC tablet Commonly known as:  VOLTAREN Take 75 mg by mouth daily.   escitalopram 20 MG tablet Commonly known as:  LEXAPRO TAKE 1 TABLET BY MOUTH EVERY DAY   levothyroxine 88 MCG tablet Commonly known as:  SYNTHROID, LEVOTHROID TAKE 1 TABLET BY MOUTH EVERY DAY   tiZANidine 4 MG tablet Commonly known as:  ZANAFLEX Take 1 tablet (4 mg total) by mouth Nightly.

## 2018-02-03 ENCOUNTER — Other Ambulatory Visit: Payer: Self-pay | Admitting: Family Medicine

## 2018-02-05 NOTE — Telephone Encounter (Signed)
Last office visit 12/28/2017 for CPE.  Last refilled 08/12/2017 for #30 with 1 refill.  Ok to refill?

## 2018-02-26 ENCOUNTER — Other Ambulatory Visit: Payer: Self-pay | Admitting: Family Medicine

## 2018-03-01 ENCOUNTER — Other Ambulatory Visit: Payer: Self-pay | Admitting: Family Medicine

## 2018-04-16 ENCOUNTER — Other Ambulatory Visit: Payer: Self-pay

## 2018-04-16 ENCOUNTER — Inpatient Hospital Stay
Admission: EM | Admit: 2018-04-16 | Discharge: 2018-04-17 | DRG: 281 | Disposition: A | Discharge status: Home Health | Payer: BC Managed Care – PPO | Attending: Internal Medicine | Admitting: Internal Medicine

## 2018-04-16 ENCOUNTER — Emergency Department: Payer: BC Managed Care – PPO

## 2018-04-16 ENCOUNTER — Encounter: Payer: Self-pay | Admitting: Emergency Medicine

## 2018-04-16 DIAGNOSIS — I214 Non-ST elevation (NSTEMI) myocardial infarction: Secondary | ICD-10-CM

## 2018-04-16 DIAGNOSIS — Z79899 Other long term (current) drug therapy: Secondary | ICD-10-CM

## 2018-04-16 DIAGNOSIS — K449 Diaphragmatic hernia without obstruction or gangrene: Secondary | ICD-10-CM | POA: Diagnosis present

## 2018-04-16 DIAGNOSIS — Z8709 Personal history of other diseases of the respiratory system: Secondary | ICD-10-CM | POA: Diagnosis not present

## 2018-04-16 DIAGNOSIS — J9811 Atelectasis: Secondary | ICD-10-CM | POA: Diagnosis present

## 2018-04-16 DIAGNOSIS — I10 Essential (primary) hypertension: Secondary | ICD-10-CM | POA: Diagnosis present

## 2018-04-16 DIAGNOSIS — E039 Hypothyroidism, unspecified: Secondary | ICD-10-CM | POA: Diagnosis present

## 2018-04-16 DIAGNOSIS — F329 Major depressive disorder, single episode, unspecified: Secondary | ICD-10-CM | POA: Diagnosis present

## 2018-04-16 DIAGNOSIS — Z809 Family history of malignant neoplasm, unspecified: Secondary | ICD-10-CM

## 2018-04-16 DIAGNOSIS — I2721 Secondary pulmonary arterial hypertension: Secondary | ICD-10-CM | POA: Diagnosis present

## 2018-04-16 DIAGNOSIS — Z79890 Hormone replacement therapy (postmenopausal): Secondary | ICD-10-CM

## 2018-04-16 DIAGNOSIS — M1711 Unilateral primary osteoarthritis, right knee: Secondary | ICD-10-CM | POA: Diagnosis present

## 2018-04-16 DIAGNOSIS — E785 Hyperlipidemia, unspecified: Secondary | ICD-10-CM | POA: Diagnosis present

## 2018-04-16 DIAGNOSIS — I161 Hypertensive emergency: Secondary | ICD-10-CM | POA: Diagnosis present

## 2018-04-16 DIAGNOSIS — Z6841 Body Mass Index (BMI) 40.0 and over, adult: Secondary | ICD-10-CM | POA: Diagnosis not present

## 2018-04-16 DIAGNOSIS — Z8249 Family history of ischemic heart disease and other diseases of the circulatory system: Secondary | ICD-10-CM | POA: Diagnosis not present

## 2018-04-16 HISTORY — DX: Essential (primary) hypertension: I10

## 2018-04-16 LAB — PROTIME-INR
INR: 0.87
Prothrombin Time: 11.8 seconds (ref 11.4–15.2)

## 2018-04-16 LAB — CBC
HCT: 46.5 % (ref 39.0–52.0)
Hemoglobin: 14.9 g/dL (ref 13.0–17.0)
MCH: 28 pg (ref 26.0–34.0)
MCHC: 32 g/dL (ref 30.0–36.0)
MCV: 87.4 fL (ref 80.0–100.0)
Platelets: 290 10*3/uL (ref 150–400)
RBC: 5.32 MIL/uL (ref 4.22–5.81)
RDW: 15.1 % (ref 11.5–15.5)
WBC: 7.2 10*3/uL (ref 4.0–10.5)
nRBC: 0 % (ref 0.0–0.2)

## 2018-04-16 LAB — T4, FREE: Free T4: 0.63 ng/dL — ABNORMAL LOW (ref 0.82–1.77)

## 2018-04-16 LAB — BASIC METABOLIC PANEL
Anion gap: 5 (ref 5–15)
BUN: 16 mg/dL (ref 6–20)
CO2: 32 mmol/L (ref 22–32)
Calcium: 9.1 mg/dL (ref 8.9–10.3)
Chloride: 105 mmol/L (ref 98–111)
Creatinine, Ser: 1.11 mg/dL (ref 0.61–1.24)
GFR calc Af Amer: >60 mL/min (ref >60)
GFR calc non Af Amer: >60 mL/min (ref >60)
Glucose, Bld: 110 mg/dL — ABNORMAL HIGH (ref 70–99)
Potassium: 4.1 mmol/L (ref 3.5–5.1)
Sodium: 142 mmol/L (ref 135–145)

## 2018-04-16 LAB — TROPONIN I
Troponin I: 0.03 ng/mL (ref <0.03)
Troponin I: 0.14 ng/mL (ref <0.03)
Troponin I: 0.66 ng/mL (ref <0.03)

## 2018-04-16 LAB — GLUCOSE, CAPILLARY: Glucose-Capillary: 79 mg/dL (ref 70–99)

## 2018-04-16 LAB — APTT: aPTT: 31 seconds (ref 24–36)

## 2018-04-16 LAB — MRSA PCR SCREENING: MRSA by PCR: NEGATIVE

## 2018-04-16 LAB — FIBRIN DERIVATIVES D-DIMER (ARMC ONLY): Fibrin derivatives D-dimer (ARMC): 711.42 ng/mL (FEU) — ABNORMAL HIGH (ref 0.00–499.00)

## 2018-04-16 LAB — HEPARIN LEVEL (UNFRACTIONATED): Heparin Unfractionated: 0.36 IU/mL (ref 0.30–0.70)

## 2018-04-16 MED ORDER — IOHEXOL 350 MG/ML SOLN
75.0000 mL | Freq: Once | INTRAVENOUS | Status: AC | PRN
  Administered 2018-04-16: 75 mL via INTRAVENOUS

## 2018-04-16 MED ORDER — HEPARIN (PORCINE) 25000 UT/250ML-% IV SOLN
1600.0000 [IU]/h | INTRAVENOUS | Status: DC
  Administered 2018-04-16 – 2018-04-17 (×2): 1600 [IU]/h via INTRAVENOUS
  Filled 2018-04-16 (×2): qty 250

## 2018-04-16 MED ORDER — HYDROCHLOROTHIAZIDE 25 MG PO TABS
25.0000 mg | ORAL_TABLET | Freq: Every day | ORAL | Status: DC
  Administered 2018-04-16: 25 mg via ORAL
  Filled 2018-04-16: qty 1

## 2018-04-16 MED ORDER — ASPIRIN EC 81 MG PO TBEC: 81.0000 mg | DELAYED_RELEASE_TABLET | Freq: Every day | ORAL | Status: DC

## 2018-04-16 MED ORDER — TIZANIDINE HCL 4 MG PO TABS
4.0000 mg | ORAL_TABLET | Freq: Every evening | ORAL | Status: DC
  Filled 2018-04-16: qty 1

## 2018-04-16 MED ORDER — ONDANSETRON HCL 4 MG PO TABS: 4.0000 mg | ORAL_TABLET | Freq: Four times a day (QID) | ORAL | Status: DC | PRN

## 2018-04-16 MED ORDER — ONDANSETRON HCL 4 MG/2ML IJ SOLN: 4.0000 mg | Freq: Four times a day (QID) | INTRAMUSCULAR | Status: DC | PRN

## 2018-04-16 MED ORDER — ASPIRIN 81 MG PO CHEW
81.0000 mg | CHEWABLE_TABLET | ORAL | Status: AC
  Administered 2018-04-17: 81 mg via ORAL

## 2018-04-16 MED ORDER — ROSUVASTATIN CALCIUM 10 MG PO TABS
40.0000 mg | ORAL_TABLET | Freq: Every day | ORAL | Status: DC
  Administered 2018-04-16: 40 mg via ORAL
  Filled 2018-04-16: qty 4

## 2018-04-16 MED ORDER — SODIUM CHLORIDE 0.9 % WEIGHT BASED INFUSION
1.0000 mL/kg/h | INTRAVENOUS | Status: DC
  Administered 2018-04-17: 1 mL/kg/h via INTRAVENOUS

## 2018-04-16 MED ORDER — NITROGLYCERIN 0.4 MG SL SUBL
0.4000 mg | SUBLINGUAL_TABLET | SUBLINGUAL | Status: DC | PRN
  Administered 2018-04-16 (×3): 0.4 mg via SUBLINGUAL
  Filled 2018-04-16: qty 1

## 2018-04-16 MED ORDER — ESCITALOPRAM OXALATE 10 MG PO TABS
20.0000 mg | ORAL_TABLET | Freq: Every day | ORAL | Status: DC
  Filled 2018-04-16: qty 2

## 2018-04-16 MED ORDER — ACETAMINOPHEN 325 MG PO TABS: 650.0000 mg | ORAL_TABLET | Freq: Four times a day (QID) | ORAL | Status: DC | PRN

## 2018-04-16 MED ORDER — SODIUM CHLORIDE 0.9% FLUSH: 3.0000 mL | INTRAVENOUS | Status: DC | PRN

## 2018-04-16 MED ORDER — HEPARIN SODIUM (PORCINE) 5000 UNIT/ML IJ SOLN
4000.0000 [IU] | Freq: Once | INTRAMUSCULAR | Status: AC
  Administered 2018-04-16: 4000 [IU] via INTRAVENOUS
  Filled 2018-04-16: qty 1

## 2018-04-16 MED ORDER — METOPROLOL TARTRATE 25 MG PO TABS
25.0000 mg | ORAL_TABLET | Freq: Two times a day (BID) | ORAL | Status: DC
  Administered 2018-04-16 (×2): 25 mg via ORAL
  Filled 2018-04-16 (×2): qty 1

## 2018-04-16 MED ORDER — NITROGLYCERIN 2 % TD OINT: 1.0000 [in_us] | TOPICAL_OINTMENT | Freq: Once | TRANSDERMAL | Status: DC

## 2018-04-16 MED ORDER — LEVOTHYROXINE SODIUM 88 MCG PO TABS
88.0000 ug | ORAL_TABLET | Freq: Every day | ORAL | Status: DC
  Administered 2018-04-17: 88 ug via ORAL
  Filled 2018-04-16: qty 1

## 2018-04-16 MED ORDER — ACETAMINOPHEN 650 MG RE SUPP: 650.0000 mg | Freq: Four times a day (QID) | RECTAL | Status: DC | PRN

## 2018-04-16 MED ORDER — HEPARIN (PORCINE) 25000 UT/250ML-% IV SOLN: 12.0000 [IU]/kg/h | INTRAVENOUS | Status: DC

## 2018-04-16 MED ORDER — SODIUM CHLORIDE 0.9 % WEIGHT BASED INFUSION
3.0000 mL/kg/h | INTRAVENOUS | Status: DC
  Administered 2018-04-17: 3 mL/kg/h via INTRAVENOUS

## 2018-04-16 MED ORDER — ASPIRIN 81 MG PO CHEW
324.0000 mg | CHEWABLE_TABLET | ORAL | Status: DC
  Filled 2018-04-16: qty 4

## 2018-04-16 MED ORDER — NITROGLYCERIN IN D5W 200-5 MCG/ML-% IV SOLN
0.0000 ug/min | INTRAVENOUS | Status: DC
  Administered 2018-04-16: 20 ug/min via INTRAVENOUS
  Filled 2018-04-16: qty 250

## 2018-04-16 MED ORDER — SODIUM CHLORIDE 0.9% FLUSH
3.0000 mL | Freq: Two times a day (BID) | INTRAVENOUS | Status: DC
  Administered 2018-04-16: 3 mL via INTRAVENOUS

## 2018-04-16 MED ORDER — SODIUM CHLORIDE 0.9 % IV SOLN: 250.0000 mL | INTRAVENOUS | Status: DC | PRN

## 2018-04-16 NOTE — ED Provider Notes (Signed)
[facility] Emergency Department Provider Note  ____________________________________________  Time seen: Approximately 9:07 AM  I have reviewed the triage vital signs and the nursing notes.   HISTORY  Chief Complaint Chest Pain    HPI Steve Ashley is a 52 y.o. male a history of diet-controlled hyperlipidemia, morbid obesity, presenting for chest pain.  Patient reports that he woke up, took a shower, without any symptoms.  He then was getting his son's out of bed when he developed a "crushing" central nonradiating chest pain with some mild associated shortness of breath.  No associated diaphoresis, palpitations, lightheadedness or syncope.  This started at 6:30 AM and during my examination he continues to have the same pain.  EMS came to the patient's house and give him aspirin and nitroglycerin paste, without any improvement in his symptoms.  He has not eaten anything since yesterday.  He reports that over the last several weeks, he has not been having any exertional symptoms.  The patient reports having had a stress test several years ago without any positive findings.  SH: Denies smoking or cocaine FH: No family history of early CAD.Marland Kitchen     Past Medical History:  Diagnosis Date  . Ankle fracture yrs ago   left   . Arthritis   . Asthma     hx of childhood asthma, none current  . Hyperlipidemia   . Hypothyroidism   . Morbid obesity 08/10/2012  . Morbid obesity (HCC)   . Syndesmotic ankle sprain yrs ago    Patient Active Problem List   Diagnosis Date Noted  . Depression, major, single episode, moderate (HCC) 03/09/2017  . Family history of prostate cancer 10/10/2013  . Severe obesity (BMI >= 40) (HCC) 05/09/2013  . OSA (obstructive sleep apnea) 08/29/2012  . Hypothyroidism 01/06/2010  . Hyperlipidemia LDL goal <100 01/06/2010  . HALLUX RIGIDUS 07/20/2009    Past Surgical History:  Procedure Laterality Date  . ANKLE SURGERY  age 27   left ankle    . KNEE ARTHROSCOPY  yrs ago   right  . THUMB ARTHROSCOPY  age 43   left   . VENTRAL HERNIA REPAIR  12/08/2011   Procedure: LAPAROSCOPIC VENTRAL HERNIA;  Surgeon: Atilano Ina, MD,FACS;  Location: [facility];  Service: General;  Laterality: N/A;    Current Outpatient Rx  . Order #: [id number] Class: Historical Med  . Order #: [id number] Class: Normal  . Order #: [id number] Class: Normal  . Order #: [id number] Class: Normal  . Order #: [id number] Class: Normal    Allergies Latex  Family History  Problem Relation Age of Onset  . Hypertension Mother   . Cancer Father        prostate  . Heart disease Neg Hx   . Colon cancer Neg Hx     Social History Social History   Tobacco Use  . Smoking status: Never Smoker  . Smokeless tobacco: Never Used  Substance Use Topics  . Alcohol use: Yes    Alcohol/week: 0.0 standard drinks    Comment: occassional; once monthly  . Drug use: No    Review of Systems Constitutional: No fever/chills.  No diaphoresis.  No lightheadedness or syncope. Eyes: No visual changes. ENT: No congestion or rhinorrhea. Cardiovascular: Positive central chest pain. Denies palpitations. Respiratory: Positive shortness of breath.  No cough. Gastrointestinal: No abdominal pain.  No nausea, no vomiting.  No diarrhea.  No constipation. Genitourinary: Negative for dysuria. Musculoskeletal: Negative for back pain.  No lower extremity swelling or calf pain.  Skin: Negative for rash. Neurological: Negative for headaches. No focal numbness, tingling or weakness.     ____________________________________________   PHYSICAL EXAM:  VITAL SIGNS: ED Triage Vitals  Enc Vitals Group     BP 04/16/18 0836 (!) 168/109     Pulse Rate 04/16/18 0836 83     Resp 04/16/18 0836 15     Temp 04/16/18 0836 98.2 F (36.8 C)     Temp Source 04/16/18 0836 Oral     SpO2 04/16/18 0836 100 %     Weight 04/16/18 0833 (!) 352 lb (159.7 kg)     Height 04/16/18 0833 6' (1.829 m)     Head  Circumference --      Peak Flow --      Pain Score 04/16/18 0833 7     Pain Loc --      Pain Edu? --      Excl. in GC? --     Constitutional: Alert and oriented. Answers questions appropriately. Eyes: Conjunctivae are normal.  EOMI. No scleral icterus. Head: Atraumatic. Nose: No congestion/rhinnorhea. Mouth/Throat: Mucous membranes are moist.  Neck: No stridor.  Supple.  No JVD.  No meningismus. Cardiovascular: Normal rate, regular rhythm. No murmurs, rubs or gallops.  Respiratory: Normal respiratory effort.  No accessory muscle use or retractions. Lungs CTAB.  No wheezes, rales or ronchi. Gastrointestinal: Morbidly obese.  Soft, nontender and nondistended.  No guarding or rebound.  No peritoneal signs. Musculoskeletal: No LE edema. No ttp in the calves or palpable cords.  Negative Homan's sign. Neurologic:  A&Ox3.  Speech is clear.  Face and smile are symmetric.  EOMI.  Moves all extremities well. Skin:  Skin is warm, dry and intact. No rash noted. Psychiatric: Mood and affect are normal. Speech and behavior are normal.  Normal judgement  ____________________________________________   LABS (all labs ordered are listed, but only abnormal results are displayed)  Labs Reviewed  BASIC METABOLIC PANEL - Abnormal; Notable for the following components:      Result Value   Glucose, Bld 110 (*)    All other components within normal limits  TROPONIN I - Abnormal; Notable for the following components:   Troponin I 0.03 (*)    All other components within normal limits  FIBRIN DERIVATIVES D-DIMER ([facility] ONLY) - Abnormal; Notable for the following components:   Fibrin derivatives D-dimer ([facility]) 711.42 (*)    All other components within normal limits  TROPONIN I - Abnormal; Notable for the following components:   Troponin I 0.14 (*)    All other components within normal limits  CBC  PROTIME-INR  APTT   ____________________________________________  EKG  ED ECG REPORT I,  Anne-Caroline Sharma CovertNorman, the attending physician, personally viewed and interpreted this ECG.   Date: 04/16/2018  EKG Time: 833  Rate: 82  Rhythm: normal sinus rhythm  Axis: normal  Intervals:none  ST&T Change: No STEMI  ____________________________________________  RADIOLOGY  Dg Chest 2 View  Result Date: 04/16/2018 CLINICAL DATA:  Chest pain EXAM: CHEST - 2 VIEW COMPARISON:  April 30, 2015 FINDINGS: The lungs are clear. The heart size and pulmonary vascularity are within normal limits. No adenopathy. No evident bone lesions. No pneumothorax. IMPRESSION: No edema or consolidation. Electronically Signed   By: Bretta BangWilliam  Woodruff III M.D.   On: 04/16/2018 08:53   Ct Angio Chest Pe W And/or Wo Contrast  Result Date: 04/16/2018 CLINICAL DATA:  Chest pain EXAM: CT ANGIOGRAPHY CHEST WITH CONTRAST TECHNIQUE: Multidetector CT imaging of the chest was  performed using the standard protocol during bolus administration of intravenous contrast. Multiplanar CT image reconstructions and MIPs were obtained to evaluate the vascular anatomy. CONTRAST:  150 mL OMNIPAQUE IOHEXOL 350 MG/ML SOLN COMPARISON:  Chest radiograph April 16, 2018 FINDINGS: Cardiovascular: There is no demonstrable pulmonary embolus. There is no thoracic aortic aneurysm or dissection. The visualized great vessels appear normal. There is no pericardial effusion or pericardial thickening. There is a degree of left ventricular hypertrophy. There is prominence of the main pulmonary outflow tract measuring 3.4 cm. Mediastinum/Nodes: Thyroid appears unremarkable. There is no appreciable thoracic adenopathy. There is a small hiatal hernia. Lungs/Pleura: There is mild bibasilar atelectatic change. There is no appreciable lung edema or consolidation. On axial slice 21 series 11, there is a 2 mm nodular opacity in the anterior segment right upper lobe. On axial slice 46 series 11, there is a 3 mm nodular opacity abutting the pleura in the lateral  segment of the right middle lobe. No evident pleural effusion or pleural thickening. Upper Abdomen: Visualized upper abdominal structures appear unremarkable. Musculoskeletal: There are no blastic or lytic bone lesions. No evident chest wall lesions. Review of the MIP images confirms the above findings. IMPRESSION: 1. No demonstrable pulmonary embolus. There is no thoracic aortic aneurysm or dissection. 2. Prominence the main pulmonary outflow tract, a finding felt to be indicative of a degree of pulmonary arterial hypertension. 3.  There is left ventricular hypertrophy. 4. Bibasilar atelectasis. No edema or consolidation. Small nodular opacities on the right, largest measuring 3 mm. No follow-up needed if patient is low-risk (and has no known or suspected primary neoplasm). Non-contrast chest CT can be considered in 12 months if patient is high-risk. This recommendation follows the consensus statement: Guidelines for Management of Incidental Pulmonary Nodules Detected on CT Images: From the Fleischner Society 2017; Radiology 2017; 284:228-243. 5.  No demonstrable thoracic adenopathy. 6.  Small hiatal hernia. Electronically Signed   By: Bretta Bang III M.D.   On: 04/16/2018 11:22    ____________________________________________   PROCEDURES  Procedure(s) performed: None  Procedures  Critical Care performed: Yes ____________________________________________   INITIAL IMPRESSION / ASSESSMENT AND PLAN / ED COURSE  Pertinent labs & imaging results that were available during my care of the patient were reviewed by me and considered in my medical decision making (see chart for details).  52 y.o. male with morbid obesity and diet-controlled hyperlipidemia presenting with chest pain.  Overall, the patient is hypertensive here, and states he has never been diagnosed with that before.  I will continue to treat him with sublingual nitroglycerin and monitor his hypertension.  The patient has not been  having any symptoms leading up to today's episode; angina is possible but less likely.  His EKG does not show any ischemic changes and a troponin is pending.  We will also get a second troponin given the onset of symptoms at 6:30 AM.  The patient has received aspirin by EMS.  The likelihood of PE is low, so a d-dimer has been ordered.  The patient's pain may also be GI in nature, including symptomatic hiatal hernia, GERD or ulcer disease, although the patient has not had anything to eat this morning.  Plan reevaluation for final disposition.  ----------------------------------------- 11:39 AM on 04/16/2018 -----------------------------------------  The patient's blood pressure and chest pain have resolved on a nitroglycerin drip.  He is asymptomatic except for some mild lightheadedness at this time.  The patient did have an elevation from 0.03-0.14 and his troponin and  heparin bolus and drip have been ordered.  The patient's CT scan does not show a PE.  The patient will be admitted for continued evaluation and treatment.  CRITICAL CARE Performed by: Rockne Menghini   Total critical care time: 35 minutes  Critical care time was exclusive of separately billable procedures and treating other patients.  Critical care was necessary to treat or prevent imminent or life-threatening deterioration.  Critical care was time spent personally by me on the following activities: development of treatment plan with patient and/or surrogate as well as nursing, discussions with consultants, evaluation of patient's response to treatment, examination of patient, obtaining history from patient or surrogate, ordering and performing treatments and interventions, ordering and review of laboratory studies, ordering and review of radiographic studies, pulse oximetry and re-evaluation of patient's condition.   ____________________________________________  FINAL CLINICAL IMPRESSION(S) / ED DIAGNOSES  Final  diagnoses:  NSTEMI (non-ST elevated myocardial infarction) (HCC)         NEW MEDICATIONS STARTED DURING THIS VISIT:  New Prescriptions   No medications on file      Rockne Menghini, MD 04/16/18 1140

## 2018-04-16 NOTE — H&P (Addendum)
[facility] [place] [facility]   PATIENT NAME: Steve Ashley    MR#:  [id number]  DATE OF BIRTH:  03-Jan-1966  DATE OF ADMISSION:  04/16/2018  PRIMARY CARE PHYSICIAN: Hannah Beat, MD   REQUESTING/REFERRING PHYSICIAN: Dr Virgilio Frees  CHIEF COMPLAINT:   Chief Complaint  Patient presents with  . Chest Pain    HISTORY OF PRESENT ILLNESS:  Steve Ashley  is a 52 y.o. male presents with chest pain this morning.  He felt like an elephant sitting on his chest.  Crushing pain stent of the chest without radiation 8 and a 10 intensity.  He drove his kids to school and tried to sit down for a little while but came into the ER for further evaluation.  In the ER his blood pressure was elevated.  He was started on nitroglycerin drip and brought his pain down to a 2 out of 10 in intensity.  When he had this episode he did not have any sweating or nausea or vomiting.  He did have shortness of breath.  Now he feels the shortness of breath is a little bit better.  Hospitalist services were contacted for further evaluation when the second troponin went up.  PAST MEDICAL HISTORY:   Past Medical History:  Diagnosis Date  . Ankle fracture yrs ago   left   . Arthritis   . Asthma     hx of childhood asthma, none current  . Hyperlipidemia   . Hypothyroidism   . Morbid obesity 08/10/2012  . Morbid obesity (HCC)   . Syndesmotic ankle sprain yrs ago    PAST SURGICAL HISTORY:   Past Surgical History:  Procedure Laterality Date  . ANKLE SURGERY  age 47   left ankle   . BACK SURGERY    . KNEE ARTHROSCOPY  yrs ago   right  . THUMB ARTHROSCOPY  age 70   left   . VENTRAL HERNIA REPAIR  12/08/2011   Procedure: LAPAROSCOPIC VENTRAL HERNIA;  Surgeon: Atilano Ina, MD,FACS;  Location: [facility];  Service: General;  Laterality: N/A;    SOCIAL HISTORY:   Social History   Tobacco Use  . Smoking status: Never Smoker  . Smokeless tobacco: Never Used  Substance  Use Topics  . Alcohol use: Yes    Alcohol/week: 0.0 standard drinks    Comment: occassional; once monthly    FAMILY HISTORY:   Family History  Problem Relation Age of Onset  . Hypertension Mother   . Cancer Father        prostate  . Heart disease Neg Hx   . Colon cancer Neg Hx     DRUG ALLERGIES:   Allergies  Allergen Reactions  . Latex Itching    REVIEW OF SYSTEMS:  CONSTITUTIONAL: No fever, fatigue.  Positive for chills last night. EYES: No blurred or double vision.  EARS, NOSE, AND THROAT: No tinnitus or ear pain. No sore throat RESPIRATORY: No cough.  Positive for shortness of breath.  No wheezing or hemoptysis.  CARDIOVASCULAR: Positive for chest pain.  No orthopnea, edema.  GASTROINTESTINAL: No nausea, vomiting, diarrhea or abdominal pain. No blood in bowel movements GENITOURINARY: No dysuria, hematuria.  ENDOCRINE: No polyuria, nocturia.  History of thyroid issues HEMATOLOGY: No anemia, easy bruising or bleeding SKIN: No rash or lesion. MUSCULOSKELETAL: Right knee and shoulder pain NEUROLOGIC: No tingling, numbness, weakness.  PSYCHIATRY: No anxiety or depression.   MEDICATIONS AT HOME:   Prior to Admission medications   Medication Sig  Start Date End Date Taking? Authorizing Provider  diclofenac (VOLTAREN) 75 MG EC tablet Take 75 mg by mouth daily.   Yes [provider]  escitalopram (LEXAPRO) 20 MG tablet TAKE 1 TABLET BY MOUTH EVERY DAY Patient taking differently: Take 20 mg by mouth daily.  03/01/18  Yes Bedsole, Amy E, MD  levothyroxine (SYNTHROID, LEVOTHROID) 88 MCG tablet TAKE 1 TABLET BY MOUTH EVERY DAY Patient taking differently: Take 88 mcg by mouth daily before breakfast.  02/26/18  Yes [name], Karleen HampshireSpencer, MD  tiZANidine (ZANAFLEX) 4 MG tablet Take 1 tablet (4 mg total) by mouth Nightly. 08/12/17  Yes [name], Karleen HampshireSpencer, MD      VITAL SIGNS:  Blood pressure (!) 165/111, pulse 82, temperature 98.2 F (36.8 C), temperature source Oral, resp. rate  12, height 6' (1.829 m), weight (!) 159.7 kg, SpO2 94 %.  PHYSICAL EXAMINATION:  GENERAL:  52 y.o.-year-old patient lying in the bed with no acute distress.  EYES: Pupils equal, round, reactive to light and accommodation. No scleral icterus. Extraocular muscles intact.  HEENT: Head atraumatic, normocephalic. Oropharynx and nasopharynx clear.  NECK:  Supple, no jugular venous distention. No thyroid enlargement, no tenderness.  LUNGS: Normal breath sounds bilaterally, no wheezing, rales,rhonchi or crepitation. No use of accessory muscles of respiration.  CARDIOVASCULAR: S1, S2 normal. No murmurs, rubs, or gallops.  ABDOMEN: Soft, nontender, nondistended. Bowel sounds present. No organomegaly or mass.  EXTREMITIES: Trace pedal edema, no cyanosis, or clubbing.  NEUROLOGIC: Cranial nerves II through XII are intact. Muscle strength 5/5 in all extremities. Sensation intact. Gait not checked.  PSYCHIATRIC: The patient is alert and oriented x 3.  SKIN: No rash, lesion, or ulcer.   LABORATORY PANEL:   CBC Recent Labs  Lab 04/16/18 0834  WBC 7.2  HGB 14.9  HCT 46.5  PLT 290   ------------------------------------------------------------------------------------------------------------------  Chemistries  Recent Labs  Lab 04/16/18 0834  NA 142  K 4.1  CL 105  CO2 32  GLUCOSE 110*  BUN 16  CREATININE 1.11  CALCIUM 9.1   ------------------------------------------------------------------------------------------------------------------  Cardiac Enzymes Recent Labs  Lab 04/16/18 1041  TROPONINI 0.14*   ------------------------------------------------------------------------------------------------------------------  RADIOLOGY:  Dg Chest 2 View  Result Date: 04/16/2018 CLINICAL DATA:  Chest pain EXAM: CHEST - 2 VIEW COMPARISON:  April 30, 2015 FINDINGS: The lungs are clear. The heart size and pulmonary vascularity are within normal limits. No adenopathy. No evident bone lesions.  No pneumothorax. IMPRESSION: No edema or consolidation. Electronically Signed   By: Bretta BangWilliam  Woodruff III M.D.   On: 04/16/2018 08:53   Ct Angio Chest Pe W And/or Wo Contrast  Result Date: 04/16/2018 CLINICAL DATA:  Chest pain EXAM: CT ANGIOGRAPHY CHEST WITH CONTRAST TECHNIQUE: Multidetector CT imaging of the chest was performed using the standard protocol during bolus administration of intravenous contrast. Multiplanar CT image reconstructions and MIPs were obtained to evaluate the vascular anatomy. CONTRAST:  150 mL OMNIPAQUE IOHEXOL 350 MG/ML SOLN COMPARISON:  Chest radiograph April 16, 2018 FINDINGS: Cardiovascular: There is no demonstrable pulmonary embolus. There is no thoracic aortic aneurysm or dissection. The visualized great vessels appear normal. There is no pericardial effusion or pericardial thickening. There is a degree of left ventricular hypertrophy. There is prominence of the main pulmonary outflow tract measuring 3.4 cm. Mediastinum/Nodes: Thyroid appears unremarkable. There is no appreciable thoracic adenopathy. There is a small hiatal hernia. Lungs/Pleura: There is mild bibasilar atelectatic change. There is no appreciable lung edema or consolidation. On axial slice 21 series 11, there is a 2  mm nodular opacity in the anterior segment right upper lobe. On axial slice 46 series 11, there is a 3 mm nodular opacity abutting the pleura in the lateral segment of the right middle lobe. No evident pleural effusion or pleural thickening. Upper Abdomen: Visualized upper abdominal structures appear unremarkable. Musculoskeletal: There are no blastic or lytic bone lesions. No evident chest wall lesions. Review of the MIP images confirms the above findings. IMPRESSION: 1. No demonstrable pulmonary embolus. There is no thoracic aortic aneurysm or dissection. 2. Prominence the main pulmonary outflow tract, a finding felt to be indicative of a degree of pulmonary arterial hypertension. 3.  There is left  ventricular hypertrophy. 4. Bibasilar atelectasis. No edema or consolidation. Small nodular opacities on the right, largest measuring 3 mm. No follow-up needed if patient is low-risk (and has no known or suspected primary neoplasm). Non-contrast chest CT can be considered in 12 months if patient is high-risk. This recommendation follows the consensus statement: Guidelines for Management of Incidental Pulmonary Nodules Detected on CT Images: From the Fleischner Society 2017; Radiology 2017; 284:228-243. 5.  No demonstrable thoracic adenopathy. 6.  Small hiatal hernia. Electronically Signed   By: Bretta Bang III M.D.   On: 04/16/2018 11:22    EKG:   NSR 82 bpm, poor r wave progression   IMPRESSION AND PLAN:   1.  Accelerated hypertension.  Patient placed on nitro drip.  Will add metoprolol and hydrochlorothiazide. 2.  Chest pain with rising second troponin.  Check a third troponin.  Monitor in stepdown since the patient is on nitroglycerin drip.  Add heparin drip.  Aspirin and metoprolol.  Check a lipid profile tomorrow morning.  Chest pain could happen with accelerated hypertension. 3.  Morbid obesity with a BMI of 47.74.  Weight loss needed 4.  Hypothyroidism unspecified on levothyroxine.  Check a TSH tomorrow morning 5.  Depression on Lexapro 6.  Right shoulder and right knee pain.  Hold diclofenac for now  All the records are reviewed and case discussed with ED provider. Management plans discussed with the patient, family and they are in agreement.  CODE STATUS: full code  TOTAL TIME TAKING CARE OF THIS PATIENT: 50 minutes.    Alford Highland M.D on 04/16/2018 at 12:41 PM  Between 7am to 6pm - Pager - [phone]  After 6pm call admission pager [phone]  [facility]  [phone]  CC: Primary care physician; Hannah Beat, MD

## 2018-04-16 NOTE — ED Notes (Signed)
ED Provider at bedside. 

## 2018-04-16 NOTE — ED Triage Notes (Signed)
PT arrived with sudden onset of mid sternum chest pain that pt reports started at 630 this am. Pt states the pain feels like a discomfort/pressure. Pt was hypertensive in route and was given 324 of aspirin in route and 1" of NTG paste was applied.

## 2018-04-16 NOTE — Consult Note (Addendum)
Name: Steve Ashley MRN: [id number] DOB: 03/27/1966     CONSULTATION DATE: 04/16/2018   HISTORY OF PRESENT ILLNESS:    52 years old man with history of degenerative joint disease, osteoarthritis, hypothyroidism, morbid obesity and depression.  Patient presented to the emergency room with acute onset of retrosternal chest pain, was found to have elevated blood pressure and mild elevation of troponin.  He was started on heparin and NTG drip for blood pressure control.  No acute ST segment changes on EKG. Patient arrived to the intensive care unit in no distress, chest pain has been relieved with nitroglycerin sublingual and on NTG drip for blood pressure control. All history was obtained from admitting physician Dr. Fonnie BirkenheadWhiting, the patient and EMR.  PAST MEDICAL HISTORY :   has a past medical history of Ankle fracture (yrs ago), Arthritis, Asthma, Hyperlipidemia, Hypothyroidism, Morbid obesity (08/10/2012), Morbid obesity (HCC), and Syndesmotic ankle sprain (yrs ago).  has a past surgical history that includes Ankle surgery (age 52); Knee arthroscopy (yrs ago); Thumb arthroscopy (age 52); Ventral hernia repair (12/08/2011); and Back surgery. Prior to Admission medications   Medication Sig Start Date End Date Taking? Authorizing Provider  diclofenac (VOLTAREN) 75 MG EC tablet Take 75 mg by mouth daily.   Yes [provider]  escitalopram (LEXAPRO) 20 MG tablet TAKE 1 TABLET BY MOUTH EVERY DAY Patient taking differently: Take 20 mg by mouth daily.  03/01/18  Yes Bedsole, Amy E, MD  levothyroxine (SYNTHROID, LEVOTHROID) 88 MCG tablet TAKE 1 TABLET BY MOUTH EVERY DAY Patient taking differently: Take 88 mcg by mouth daily before breakfast.  02/26/18  Yes [name], Karleen HampshireSpencer, MD  tiZANidine (ZANAFLEX) 4 MG tablet Take 1 tablet (4 mg total) by mouth Nightly. 08/12/17  Yes [name], Karleen HampshireSpencer, MD   Allergies  Allergen Reactions  . Latex Itching    FAMILY HISTORY:  family history includes Cancer  in his father; Hypertension in his mother. SOCIAL HISTORY:  reports that he has never smoked. He has never used smokeless tobacco. He reports that he drinks alcohol. He reports that he does not use drugs.  REVIEW OF SYSTEMS:   Unable to obtain due to critical illness   VITAL SIGNS: Temp:  [98.2 F (36.8 C)] 98.2 F (36.8 C) (11/18 1343) Pulse Rate:  [79-95] 80 (11/18 1400) Resp:  [11-15] 15 (11/18 1400) BP: (95-171)/(65-114) 134/93 (11/18 1400) SpO2:  [92 %-100 %] 98 % (11/18 1400) Weight:  [159.7 kg] 159.7 kg (11/18 1343)  Physical Examination:  Awake and oriented with no acute neurological deficits Room air, no distress, able to talk in full sentences, bilateral equal air entry with no adventitious sounds S1 & S2 are audible with no murmur   ASSESSMENT / PLAN:  Hypertensive emergency with chest pain and mild elevation of troponin.  CT Angio of the chest ruled out PE, there is possible pulmonary hypertension and left ventricular hypertrophy.  EKG showed possible poor progression of R wave, coronary artery disease however no acute specific ST segment changes. -Aspirin + heparin drip + beta-blocker + statins -Follows cardiology for further management and ischemia work-up -Monitor echocardiogram and cardiac enzymes  Incidental finding of right lung nodular opacities -Pulmonary consult after resolution of acute phase of illness and arrangement for follow-up CT chest with the discharge plan  Hypothyroidism -Optimize levothyroxine and monitor free T4  Degenerative joint disease and osteo arthritis of right knee -Anti-inflammatory and awaiting evaluation by orthopedic as an outpatient  Depression -Lexapro  Full code  Supportive care  Critical care time 45 minutes

## 2018-04-16 NOTE — Consult Note (Signed)
ANTICOAGULATION CONSULT NOTE - Initial Consult  Pharmacy Consult for Heparin Indication: NSTEMI  Allergies  Allergen Reactions  . Latex Itching    Patient Measurements: Height: 6' (182.9 cm) Weight: (!) 352 lb (159.7 kg) IBW/kg (Calculated) : 77.6 Heparin Dosing Weight: 115.8 kg  Vital Signs: Temp: 98.2 F (36.8 C) (11/18 0836) Temp Source: Oral (11/18 0836) BP: 170/105 (11/18 1130) Pulse Rate: 80 (11/18 1130)  Labs: Recent Labs    04/16/18 0834 04/16/18 1041  HGB 14.9  --   HCT 46.5  --   PLT 290  --   CREATININE 1.11  --   TROPONINI 0.03* 0.14*    Estimated Creatinine Clearance: 120.2 mL/min (by C-G formula based on SCr of 1.11 mg/dL).   Medical History: Past Medical History:  Diagnosis Date  . Ankle fracture yrs ago   left   . Arthritis   . Asthma     hx of childhood asthma, none current  . Hyperlipidemia   . Hypothyroidism   . Morbid obesity 08/10/2012  . Morbid obesity (HCC)   . Syndesmotic ankle sprain yrs ago    Medications:  Scheduled:  . heparin  4,000 Units Intravenous Once  . nitroGLYCERIN  1 inch Topical Once    Assessment: 52 yo male presented on 11/18 with chest pain, diagnosed with NSTEMI. PMH includes hyperlipidemia, hypothyroidism, and asthma.  Patient not on any PTA anticoagulants.    Goal of Therapy:  Heparin level 0.3-0.7 units/ml Monitor platelets by anticoagulation protocol: Yes   Plan:  All labs ordered prior to heparin administration. Ordered heparin bolus of 4000 units, followed by continuous infusion of 1600 units/hr. Will order heparin level 6 hours from administration ~1900.  Pharmacy will continue to monitor.   Mauri ReadingSavanna M Angelamarie Avakian, PharmD Pharmacy Resident  04/16/2018 11:59 AM

## 2018-04-16 NOTE — Care Management Note (Signed)
Case Management Note  Patient Details  Name: Steve FaceDarryl B Legendre MRN: [id number] Date of Birth: 01/15/1966  Subjective/Objective:        Patient admitted to the ICU with chest pain.  Patient reports he woke up this morning with pain and feeling like something was sitting on his chest, he was able to get his 2 children to school and then himself to work where staff called EMS.  Patient lives at home by himself he shares custody of his 2 children with his x wife.  He works as a Furniture conservator/restorerphysical education teacher.  Patient reports that he has never had any problems with his heart.  Patient states that he is completely independent in ADL's, he drives and has reliable transportation.   Patient's PCP is Dr. Patsy Lageropland who he sees every 6 months.  Pharmacy is [facility] in [place].  Patient reports that he recently quit taking all of his prescription medications because he is going to have shoulder surgery on Dec. 17th.  RNCM questioned why patient decided to stop all medications and he reports that he just stopped everything.  RNCM instructed patient to refer to cardiology about plans for shoulder surgery.    RNCM will cont to follow as needed for discharge planning. Robbie LisJeanna Alba Kriesel RN BSN [phone]              Action/Plan:   Expected Discharge Date:                  Expected Discharge Plan:  Home/Self Care  In-House Referral:     Discharge planning Services  CM Consult  Post Acute Care Choice:    Choice offered to:     DME Arranged:    DME Agency:     HH Arranged:    HH Agency:     Status of Service:  In process, will continue to follow  If discussed at Long Length of Stay Meetings, dates discussed:    Additional Comments:  Allayne ButcherJeanna M Ayauna Mcnay, RN 04/16/2018, 3:22 PM

## 2018-04-16 NOTE — Consult Note (Signed)
ANTICOAGULATION CONSULT NOTE - Initial Consult  Pharmacy Consult for Heparin Indication: NSTEMI  Allergies  Allergen Reactions  . Latex Itching    Patient Measurements: Height: 6' (182.9 cm) Weight: (!) 352 lb (159.7 kg) IBW/kg (Calculated) : 77.6 Heparin Dosing Weight: 115.8 kg  Vital Signs: Temp: 97.8 F (36.6 C) (11/18 1820) Temp Source: Oral (11/18 1820) BP: 139/93 (11/18 1900) Pulse Rate: 76 (11/18 1900)  Labs: Recent Labs    04/16/18 0834 04/16/18 0907 04/16/18 1041 04/16/18 1400 04/16/18 1903  HGB 14.9  --   --   --   --   HCT 46.5  --   --   --   --   PLT 290  --   --   --   --   APTT  --  31  --   --   --   LABPROT  --  11.8  --   --   --   INR  --  0.87  --   --   --   HEPARINUNFRC  --   --   --   --  0.36  CREATININE 1.11  --   --   --   --   TROPONINI 0.03*  --  0.14* 0.66*  --     Estimated Creatinine Clearance: 120.2 mL/min (by C-G formula based on SCr of 1.11 mg/dL).   Medical History: Past Medical History:  Diagnosis Date  . Ankle fracture yrs ago   left   . Arthritis   . Asthma     hx of childhood asthma, none current  . Hyperlipidemia   . Hypothyroidism   . Morbid obesity 08/10/2012  . Morbid obesity (HCC)   . Syndesmotic ankle sprain yrs ago    Medications:  Scheduled:  . [START ON 04/17/2018] aspirin  81 mg Oral Pre-Cath  . [START ON 04/17/2018] aspirin EC  81 mg Oral Daily  . [START ON 04/17/2018] escitalopram  20 mg Oral Daily  . hydrochlorothiazide  25 mg Oral Daily  . [START ON 04/17/2018] levothyroxine  88 mcg Oral QAC breakfast  . metoprolol tartrate  25 mg Oral BID  . nitroGLYCERIN  1 inch Topical Once  . rosuvastatin  40 mg Oral q1800  . sodium chloride flush  3 mL Intravenous Q12H  . tiZANidine  4 mg Oral Nightly    Assessment: 52 yo male presented on 11/18 with chest pain, diagnosed with NSTEMI. PMH includes hyperlipidemia, hypothyroidism, and asthma.  Patient not on any PTA anticoagulants.    Goal of Therapy:   Heparin level 0.3-0.7 units/ml Monitor platelets by anticoagulation protocol: Yes   Plan:  11/18 1900 Heparin level 0.36. Heparin level is therapeutic and therefore will continue present rate of heparin 1600 units/hr.  Will recheck heparin level in 6 hours. Pharmacy will continue to monitor.  Orinda Kennerhris A Olimpia Tinch, PharmD Clinical Pharmacist 04/16/2018 7:42 PM

## 2018-04-16 NOTE — Consult Note (Signed)
[facility] Cardiology  CARDIOLOGY CONSULT NOTE  Patient ID: Steve Ashley MRN: [id number] DOB/AGE: 09/05/1965 52 y.o.  Admit date: 04/16/2018 Referring Physician [place] Primary Physician [name], [facility] Primary Cardiologist None per patient Reason for Consultation Chest pain, elevated troponin  HPI: 52 year old male referred for evaluation of chest pain and elevated troponin. The patient has a history of obesity and hyperlipidemia. The patient reports feeling well this morning. When he stepped out of the shower this morning, he noted centralized chest pressure with shortness of breath. The chest pain and shortness of breath increased as the morning progressed, and felt as if something was sitting on his chest. 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 EMTs gave him aspirin and nitroglycerin paste without improvement in his symptoms. The patient was transported [facility] ER where he was noted to have elevated blood pressure of 168/109. The patient denies a history of hypertension. The patient had some relief with additional sublingual nitroglycerin, and was started on nitroglycerin drip with resolution of his chest pain and improvement in his blood pressure. ECG revealed sinus rhythm at a rate of 82 bpm without acute ST-T wave abnormality. Admission labs notable for borderline elevated troponin of 0.03, followed by 0.14. The patient was started on heparin drip. Chest CT negative for pulmonary embolus or aortic dissection or aneurysm. The patient is active, but has been limited by his physical activity recently due to orthopedic issues." He denies any recent chest pain or increased shortness of breath with exertion. He denies peripheral edema.The patient denies a personal or family cardiac history.  Review of systems complete and found to be negative unless listed above     Past Medical History:  Diagnosis Date  . Ankle fracture yrs ago   left   .  Arthritis   . Asthma     hx of childhood asthma, none current  . Hyperlipidemia   . Hypothyroidism   . Morbid obesity 08/10/2012  . Morbid obesity (HCC)   . Syndesmotic ankle sprain yrs ago    Past Surgical History:  Procedure Laterality Date  . ANKLE SURGERY  age 52   left ankle   . BACK SURGERY    . KNEE ARTHROSCOPY  yrs ago   right  . THUMB ARTHROSCOPY  age 81   left   . VENTRAL HERNIA REPAIR  12/08/2011   Procedure: LAPAROSCOPIC VENTRAL HERNIA;  Surgeon: Atilano Ina, MD,FACS;  Location: [facility] ORS;  Service: General;  Laterality: N/A;    Medications Prior to Admission  Medication Sig Dispense Refill Last Dose  . diclofenac (VOLTAREN) 75 MG EC tablet Take 75 mg by mouth daily.   Past Week at 0800  . escitalopram (LEXAPRO) 20 MG tablet TAKE 1 TABLET BY MOUTH EVERY DAY (Patient taking differently: Take 20 mg by mouth daily. ) 90 tablet 1 Past Week at 2200  . levothyroxine (SYNTHROID, LEVOTHROID) 88 MCG tablet TAKE 1 TABLET BY MOUTH EVERY DAY (Patient taking differently: Take 88 mcg by mouth daily before breakfast. ) 90 tablet 3 Past Week at 0800  . tiZANidine (ZANAFLEX) 4 MG tablet Take 1 tablet (4 mg total) by mouth Nightly. 30 tablet 1 Past Week at 2200   Social History   Socioeconomic History  . Marital status: Single    Spouse name: Not on file  . Number of children: Not on file  . Years of education: Not on file  . Highest education level: Not on file  Occupational History  . Occupation: PE teacher  Social Needs  . Financial resource strain: Not on file  . Food insecurity:    Worry: Not on file    Inability: Not on file  . Transportation needs:    Medical: Not on file    Non-medical: Not on file  Tobacco Use  . Smoking status: Never Smoker  . Smokeless tobacco: Never Used  Substance and Sexual Activity  . Alcohol use: Yes    Alcohol/week: 0.0 standard drinks    Comment: occassional; once monthly  . Drug use: No  . Sexual activity: Not on file  Lifestyle  .  Physical activity:    Days per week: Not on file    Minutes per session: Not on file  . Stress: Not on file  Relationships  . Social connections:    Talks on phone: Not on file    Gets together: Not on file    Attends religious service: Not on file    Active member of club or organization: Not on file    Attends meetings of clubs or organizations: Not on file    Relationship status: Not on file  . Intimate partner violence:    Fear of current or ex partner: Not on file    Emotionally abused: Not on file    Physically abused: Not on file    Forced sexual activity: Not on file  Other Topics Concern  . Not on file  Social History Narrative   Divorced   2 sons   PE teacher at [employer]   Baseball and Football coach at [employer]    Family History  Problem Relation Age of Onset  . Hypertension Mother   . Cancer Father        prostate  . Heart disease Neg Hx   . Colon cancer Neg Hx       Review of systems complete and found to be negative unless listed above      PHYSICAL EXAM  General: Well developed, well nourished, in no acute distress, lying supine in bed, smiling HEENT:  Normocephalic and atramatic Neck:  No JVD.  Lungs: Clear bilaterally to auscultation and percussion. Heart: HRRR . Normal S1 and S2 without gallops or murmurs.  Abdomen: nondistended  Msk:  Back normal, gait not assessed. Normal strength and tone for age. Extremities: No clubbing, cyanosis or edema.   Neuro: Alert and oriented X 3. Psych:  Good affect, responds appropriately  Labs:   Lab Results  Component Value Date   WBC 7.2 04/16/2018   HGB 14.9 04/16/2018   HCT 46.5 04/16/2018   MCV 87.4 04/16/2018   PLT 290 04/16/2018    Recent Labs  Lab 04/16/18 0834  NA 142  K 4.1  CL 105  CO2 32  BUN 16  CREATININE 1.11  CALCIUM 9.1  GLUCOSE 110*   Lab Results  Component Value Date   TROPONINI 0.14 (HH) 04/16/2018    Lab Results  Component Value Date   CHOL 239 (H)  12/25/2017   CHOL 235 (H) 12/15/2016   CHOL 248 (H) 10/14/2015   Lab Results  Component Value Date   HDL 40.40 12/25/2017   HDL 40.20 12/15/2016   HDL 37.10 (L) 10/14/2015   Lab Results  Component Value Date   LDLCALC 176 (H) 12/25/2017   LDLCALC 171 (H) 12/15/2016   LDLCALC 172 (H) 10/10/2013   Lab Results  Component Value Date   TRIG 115.0 12/25/2017  TRIG 121.0 12/15/2016   TRIG 217.0 (H) 10/14/2015   Lab Results  Component Value Date   CHOLHDL 6 12/25/2017   CHOLHDL 6 12/15/2016   CHOLHDL 7 10/14/2015   Lab Results  Component Value Date   LDLDIRECT 175.0 10/14/2015   LDLDIRECT 175.0 10/14/2014   LDLDIRECT 170.9 08/01/2012      Radiology: Dg Chest 2 View  Result Date: 04/16/2018 CLINICAL DATA:  Chest pain EXAM: CHEST - 2 VIEW COMPARISON:  April 30, 2015 FINDINGS: The lungs are clear. The heart size and pulmonary vascularity are within normal limits. No adenopathy. No evident bone lesions. No pneumothorax. IMPRESSION: No edema or consolidation. Electronically Signed   By: Bretta Bang III M.D.   On: 04/16/2018 08:53   Ct Angio Chest Pe W And/or Wo Contrast  Result Date: 04/16/2018 CLINICAL DATA:  Chest pain EXAM: CT ANGIOGRAPHY CHEST WITH CONTRAST TECHNIQUE: Multidetector CT imaging of the chest was performed using the standard protocol during bolus administration of intravenous contrast. Multiplanar CT image reconstructions and MIPs were obtained to evaluate the vascular anatomy. CONTRAST:  150 mL OMNIPAQUE IOHEXOL 350 MG/ML SOLN COMPARISON:  Chest radiograph April 16, 2018 FINDINGS: Cardiovascular: There is no demonstrable pulmonary embolus. There is no thoracic aortic aneurysm or dissection. The visualized great vessels appear normal. There is no pericardial effusion or pericardial thickening. There is a degree of left ventricular hypertrophy. There is prominence of the main pulmonary outflow tract measuring 3.4 cm. Mediastinum/Nodes: Thyroid appears  unremarkable. There is no appreciable thoracic adenopathy. There is a small hiatal hernia. Lungs/Pleura: There is mild bibasilar atelectatic change. There is no appreciable lung edema or consolidation. On axial slice 21 series 11, there is a 2 mm nodular opacity in the anterior segment right upper lobe. On axial slice 46 series 11, there is a 3 mm nodular opacity abutting the pleura in the lateral segment of the right middle lobe. No evident pleural effusion or pleural thickening. Upper Abdomen: Visualized upper abdominal structures appear unremarkable. Musculoskeletal: There are no blastic or lytic bone lesions. No evident chest wall lesions. Review of the MIP images confirms the above findings. IMPRESSION: 1. No demonstrable pulmonary embolus. There is no thoracic aortic aneurysm or dissection. 2. Prominence the main pulmonary outflow tract, a finding felt to be indicative of a degree of pulmonary arterial hypertension. 3.  There is left ventricular hypertrophy. 4. Bibasilar atelectasis. No edema or consolidation. Small nodular opacities on the right, largest measuring 3 mm. No follow-up needed if patient is low-risk (and has no known or suspected primary neoplasm). Non-contrast chest CT can be considered in 12 months if patient is high-risk. This recommendation follows the consensus statement: Guidelines for Management of Incidental Pulmonary Nodules Detected on CT Images: From the Fleischner Society 2017; Radiology 2017; 284:228-243. 5.  No demonstrable thoracic adenopathy. 6.  Small hiatal hernia. Electronically Signed   By: Bretta Bang III M.D.   On: 04/16/2018 11:22    EKG: Sinus rhythm  ASSESSMENT AND PLAN:  1. Chest pain with borderline elevated troponin of 0.03, followed by 0.14, with ECG without acute ST-T wave abnormalities, currently on heparin drip. Resolved with nitroglycerin drip 2. Accelerated hypertension, improved with nitroglycerin drip. No known prior history of hypertension 3.  Hyperlipidemia 4. Obesity  Recommendations: 1. Continue to cycle cardiac enzymes 2. Continue heparin drip for now 3. Obtain 2D echocardiogram 4. Lipid panel 5. Continue metoprolol and hydrochlorothiazide  Signed: Leanora Ivanoff PA-C 04/16/2018, 1:53 PM

## 2018-04-16 NOTE — ED Notes (Signed)
Nitropaste removed from chest.

## 2018-04-17 ENCOUNTER — Encounter
Admission: EM | Disposition: A | Discharge status: Home Health | Payer: Self-pay | Source: Home / Self Care | Attending: Internal Medicine

## 2018-04-17 ENCOUNTER — Encounter: Payer: Self-pay | Admitting: Cardiology

## 2018-04-17 ENCOUNTER — Inpatient Hospital Stay
Admit: 2018-04-17 | Discharge: 2018-04-17 | Disposition: A | Discharge status: Home / Self Care | Payer: BC Managed Care – PPO | Attending: Physician Assistant | Admitting: Physician Assistant

## 2018-04-17 HISTORY — PX: LEFT HEART CATH AND CORONARY ANGIOGRAPHY: CATH118249

## 2018-04-17 LAB — CBC
HCT: 43.6 % (ref 39.0–52.0)
Hemoglobin: 13.9 g/dL (ref 13.0–17.0)
MCH: 27.9 pg (ref 26.0–34.0)
MCHC: 31.9 g/dL (ref 30.0–36.0)
MCV: 87.4 fL (ref 80.0–100.0)
Platelets: 280 10*3/uL (ref 150–400)
RBC: 4.99 MIL/uL (ref 4.22–5.81)
RDW: 15.1 % (ref 11.5–15.5)
WBC: 8.4 10*3/uL (ref 4.0–10.5)
nRBC: 0 % (ref 0.0–0.2)

## 2018-04-17 LAB — BASIC METABOLIC PANEL
Anion gap: 8 (ref 5–15)
BUN: 16 mg/dL (ref 6–20)
CO2: 27 mmol/L (ref 22–32)
Calcium: 9 mg/dL (ref 8.9–10.3)
Chloride: 103 mmol/L (ref 98–111)
Creatinine, Ser: 0.77 mg/dL (ref 0.61–1.24)
GFR calc Af Amer: >60 mL/min (ref >60)
GFR calc non Af Amer: >60 mL/min (ref >60)
Glucose, Bld: 114 mg/dL — ABNORMAL HIGH (ref 70–99)
Potassium: 3.6 mmol/L (ref 3.5–5.1)
Sodium: 138 mmol/L (ref 135–145)

## 2018-04-17 LAB — LIPID PANEL
Cholesterol: 238 mg/dL — ABNORMAL HIGH (ref 0–200)
HDL: 38 mg/dL — ABNORMAL LOW (ref >40)
LDL Cholesterol: 154 mg/dL — ABNORMAL HIGH (ref 0–99)
Total CHOL/HDL Ratio: 6.3 RATIO
Triglycerides: 231 mg/dL — ABNORMAL HIGH (ref <150)
VLDL: 46 mg/dL — ABNORMAL HIGH (ref 0–40)

## 2018-04-17 LAB — ECHOCARDIOGRAM COMPLETE
Height: 72 in
Weight: 5632 oz

## 2018-04-17 LAB — PHOSPHORUS: Phosphorus: 4 mg/dL (ref 2.5–4.6)

## 2018-04-17 LAB — HEPARIN LEVEL (UNFRACTIONATED)
Heparin Unfractionated: 0.42 IU/mL (ref 0.30–0.70)
Heparin Unfractionated: 0.28 IU/mL — ABNORMAL LOW (ref 0.30–0.70)

## 2018-04-17 LAB — HIV ANTIBODY (ROUTINE TESTING W REFLEX): HIV Screen 4th Generation wRfx: NONREACTIVE

## 2018-04-17 LAB — MAGNESIUM: Magnesium: 2.1 mg/dL (ref 1.7–2.4)

## 2018-04-17 SURGERY — LEFT HEART CATH AND CORONARY ANGIOGRAPHY
Anesthesia: Moderate Sedation | Laterality: Left

## 2018-04-17 MED ORDER — HEPARIN SODIUM (PORCINE) 1000 UNIT/ML IJ SOLN
INTRAMUSCULAR | Status: AC
  Filled 2018-04-17: qty 1

## 2018-04-17 MED ORDER — VERAPAMIL HCL 2.5 MG/ML IV SOLN
INTRAVENOUS | Status: DC | PRN
  Administered 2018-04-17: 2.5 mg via INTRA_ARTERIAL

## 2018-04-17 MED ORDER — VERAPAMIL HCL 2.5 MG/ML IV SOLN
INTRAVENOUS | Status: AC
  Filled 2018-04-17: qty 2

## 2018-04-17 MED ORDER — ASPIRIN 81 MG PO TBEC: 81.0000 mg | DELAYED_RELEASE_TABLET | Freq: Every day | ORAL | 0 refills | Status: AC

## 2018-04-17 MED ORDER — HYDROCHLOROTHIAZIDE 25 MG PO TABS: 25.0000 mg | ORAL_TABLET | Freq: Every day | ORAL | 0 refills | Status: DC

## 2018-04-17 MED ORDER — METOPROLOL TARTRATE 25 MG PO TABS: 25.0000 mg | ORAL_TABLET | Freq: Two times a day (BID) | ORAL | 0 refills | Status: DC

## 2018-04-17 MED ORDER — HEPARIN (PORCINE) IN NACL 1000-0.9 UT/500ML-% IV SOLN
INTRAVENOUS | Status: AC
  Filled 2018-04-17: qty 1000

## 2018-04-17 MED ORDER — MIDAZOLAM HCL 2 MG/2ML IJ SOLN
INTRAMUSCULAR | Status: AC
  Filled 2018-04-17: qty 2

## 2018-04-17 MED ORDER — HEPARIN SODIUM (PORCINE) 1000 UNIT/ML IJ SOLN
INTRAMUSCULAR | Status: DC | PRN
  Administered 2018-04-17: 8000 [IU] via INTRAVENOUS

## 2018-04-17 MED ORDER — MIDAZOLAM HCL 2 MG/2ML IJ SOLN
INTRAMUSCULAR | Status: DC | PRN
  Administered 2018-04-17: 1 mg via INTRAVENOUS

## 2018-04-17 MED ORDER — ROSUVASTATIN CALCIUM 40 MG PO TABS: 40.0000 mg | ORAL_TABLET | Freq: Every day | ORAL | 0 refills | Status: DC

## 2018-04-17 MED ORDER — FENTANYL CITRATE (PF) 100 MCG/2ML IJ SOLN
INTRAMUSCULAR | Status: DC | PRN
  Administered 2018-04-17: 25 ug via INTRAVENOUS

## 2018-04-17 MED ORDER — HEPARIN (PORCINE) 25000 UT/250ML-% IV SOLN
1600.0000 [IU]/h | INTRAVENOUS | Status: DC
  Administered 2018-04-17: 1600 [IU]/h via INTRAVENOUS

## 2018-04-17 MED ORDER — IOPAMIDOL (ISOVUE-300) INJECTION 61%
INTRAVENOUS | Status: DC | PRN
  Administered 2018-04-17: 125 mL via INTRA_ARTERIAL

## 2018-04-17 MED ORDER — LEVOTHYROXINE SODIUM 100 MCG PO TABS: 100.0000 ug | ORAL_TABLET | Freq: Every day | ORAL | Status: DC

## 2018-04-17 MED ORDER — FENTANYL CITRATE (PF) 100 MCG/2ML IJ SOLN
INTRAMUSCULAR | Status: AC
  Filled 2018-04-17: qty 2

## 2018-04-17 SURGICAL SUPPLY — 8 items
CATH INFINITI 5FR ANG PIGTAIL (CATHETERS) ×2 IMPLANT
CATH INFINITI 5FR TG (CATHETERS) ×2 IMPLANT
DEVICE RAD COMP TR BAND LRG (VASCULAR PRODUCTS) ×2 IMPLANT
GLIDESHEATH SLEND SS 6F .021 (SHEATH) ×2 IMPLANT
KIT MANI 3VAL PERCEP (MISCELLANEOUS) ×2 IMPLANT
PACK CARDIAC CATH (CUSTOM PROCEDURE TRAY) ×2 IMPLANT
WIRE HITORQ VERSACORE ST 145CM (WIRE) ×2 IMPLANT
WIRE ROSEN-J .035X260CM (WIRE) ×2 IMPLANT

## 2018-04-17 NOTE — Progress Notes (Signed)
*  PRELIMINARY RESULTS* Echocardiogram 2D Echocardiogram has been performed.  [name], [name] 04/17/2018, 1:47 PM

## 2018-04-17 NOTE — Progress Notes (Signed)
Paged Dr. Darrold JunkerParaschos to verify if there needed to be a hold on Heparin drip infusion in preparation for cardiac catheretization. Per Dr. Darrold JunkerParaschos drip to be held one hour prior to procedure.

## 2018-04-17 NOTE — Progress Notes (Signed)
   Name: Bud FaceDarryl B Keniston MRN: [id number] DOB: 10/12/1965     CONSULTATION DATE: 04/16/2018  Subjective & objective: No major issues last night  PAST MEDICAL HISTORY :   has a past medical history of Ankle fracture (yrs ago), Arthritis, Asthma, Hyperlipidemia, Hypothyroidism, Morbid obesity (08/10/2012), Morbid obesity (HCC), and Syndesmotic ankle sprain (yrs ago).  has a past surgical history that includes Ankle surgery (age 52); Knee arthroscopy (yrs ago); Thumb arthroscopy (age 52); Ventral hernia repair (12/08/2011); Back surgery; and LEFT HEART CATH AND CORONARY ANGIOGRAPHY (Left, 04/17/2018). Prior to Admission medications   Medication Sig Start Date End Date Taking? Authorizing Provider  diclofenac (VOLTAREN) 75 MG EC tablet Take 75 mg by mouth daily.   Yes [provider]  escitalopram (LEXAPRO) 20 MG tablet TAKE 1 TABLET BY MOUTH EVERY DAY Patient taking differently: Take 20 mg by mouth daily.  03/01/18  Yes Bedsole, Amy E, MD  levothyroxine (SYNTHROID, LEVOTHROID) 88 MCG tablet TAKE 1 TABLET BY MOUTH EVERY DAY Patient taking differently: Take 88 mcg by mouth daily before breakfast.  02/26/18  Yes [name], Karleen HampshireSpencer, MD  tiZANidine (ZANAFLEX) 4 MG tablet Take 1 tablet (4 mg total) by mouth Nightly. 08/12/17  Yes [name], Karleen HampshireSpencer, MD   Allergies  Allergen Reactions  . Latex Itching    FAMILY HISTORY:  family history includes Cancer in his father; Hypertension in his mother. SOCIAL HISTORY:  reports that he has never smoked. He has never used smokeless tobacco. He reports that he drinks alcohol. He reports that he does not use drugs.  REVIEW OF SYSTEMS:   Unable to obtain due to critical illness   VITAL SIGNS: Temp:  [97.8 F (36.6 C)-98.9 F (37.2 C)] 98.9 F (37.2 C) (11/19 1100) Pulse Rate:  [58-85] 65 (11/19 1030) Resp:  [9-23] 17 (11/19 1100) BP: (122-153)/(74-116) 123/78 (11/19 1100) SpO2:  [92 %-100 %] 97 % (11/19 1100) Weight:  [159.7 kg] 159.7 kg (11/18  1343)  Physical Examination:  Awake and oriented with no acute neurological deficits Room air, no distress, able to talk in full sentences, bilateral equal air entry with no adventitious sounds S1 & S2 are audible with no murmur   ASSESSMENT / PLAN:  Hypertensive emergency with chest pain and mild elevation of troponin.  CT Angio of the chest ruled out PE, there is possible pulmonary hypertension and left ventricular hypertrophy.  EKG showed possible poor progression of R wave, coronary artery disease however no acute specific ST segment changes.  Insignificant coronary artery disease on left heart cath -Aspirin + heparin drip + beta-blocker + statins -Follows cardiology for further management and ischemia work-up -Monitor echocardiogram   Incidental finding of right lung nodular opacities -Pulmonary consult after resolution of acute phase of illness and arrangement for follow-up CT chest with the discharge plan  Hypothyroidism -Optimize levothyroxine and monitor free T4  Degenerative joint disease and osteo arthritis of right knee -Anti-inflammatory and awaiting evaluation by orthopedic as an outpatient  Depression -Lexapro  Full code  Supportive care  Critical care time 40 minutes

## 2018-04-17 NOTE — Consult Note (Signed)
ANTICOAGULATION CONSULT NOTE - Initial Consult  Pharmacy Consult for Heparin Indication: NSTEMI  Allergies  Allergen Reactions  . Latex Itching    Patient Measurements: Height: 6' (182.9 cm) Weight: (!) 352 lb (159.7 kg) IBW/kg (Calculated) : 77.6 Heparin Dosing Weight: 115.8 kg  Vital Signs: Temp: 98.2 F (36.8 C) (11/19 0200) Temp Source: Axillary (11/19 0200) BP: 135/90 (11/19 0200) Pulse Rate: 80 (11/19 0200)  Labs: Recent Labs    04/16/18 0834 04/16/18 0907 04/16/18 1041 04/16/18 1400 04/16/18 1903 04/17/18 0103  HGB 14.9  --   --   --   --   --   HCT 46.5  --   --   --   --   --   PLT 290  --   --   --   --   --   APTT  --  31  --   --   --   --   LABPROT  --  11.8  --   --   --   --   INR  --  0.87  --   --   --   --   HEPARINUNFRC  --   --   --   --  0.36 0.42  CREATININE 1.11  --   --   --   --   --   TROPONINI 0.03*  --  0.14* 0.66*  --   --     Estimated Creatinine Clearance: 120.2 mL/min (by C-G formula based on SCr of 1.11 mg/dL).   Medical History: Past Medical History:  Diagnosis Date  . Ankle fracture yrs ago   left   . Arthritis   . Asthma     hx of childhood asthma, none current  . Hyperlipidemia   . Hypothyroidism   . Morbid obesity 08/10/2012  . Morbid obesity (HCC)   . Syndesmotic ankle sprain yrs ago    Medications:  Scheduled:  . aspirin  81 mg Oral Pre-Cath  . aspirin EC  81 mg Oral Daily  . escitalopram  20 mg Oral Daily  . hydrochlorothiazide  25 mg Oral Daily  . levothyroxine  88 mcg Oral QAC breakfast  . metoprolol tartrate  25 mg Oral BID  . nitroGLYCERIN  1 inch Topical Once  . rosuvastatin  40 mg Oral q1800  . sodium chloride flush  3 mL Intravenous Q12H  . tiZANidine  4 mg Oral Nightly    Assessment: 52 yo male presented on 11/18 with chest pain, diagnosed with NSTEMI. PMH includes hyperlipidemia, hypothyroidism, and asthma.  Patient not on any PTA anticoagulants.    Goal of Therapy:  Heparin level 0.3-0.7  units/ml Monitor platelets by anticoagulation protocol: Yes   Plan:  11/19 @ 0100 HL 0.42 therapeutic. Will continue current rate and will recheck HL w/ am labs. Will check CBC w/ am labs.  Thomasene Rippleavid  Hyrum Shaneyfelt, PharmD Clinical Pharmacist 04/17/2018 2:34 AM

## 2018-04-17 NOTE — Progress Notes (Signed)
1100 Received from Cath Lab Recovery. TR band removed and site looks good. MAE without issues. No complaints of pain.Remains in NSR.

## 2018-04-17 NOTE — Discharge Summary (Signed)
[facility] - [place] [facility]   PATIENT NAME: Steve Ashley    MR#:  [id number]  DATE OF BIRTH:  [birth year]  DATE OF ADMISSION:  04/16/2018 ADMITTING PHYSICIAN: Alford Highland, MD  DATE OF DISCHARGE: 04/17/2018  PRIMARY CARE PHYSICIAN: Hannah Beat, MD   ADMISSION DIAGNOSIS:  NSTEMI (non-ST elevated myocardial infarction) (HCC) [I21.4] Uncontrolled hypertension DISCHARGE DIAGNOSIS:  Active Problems:   Accelerated hypertension   NSTEMI (non-ST elevated myocardial infarction) (HCC) Uncontrolled hypertension Hyperlipidemia Hypothyroidism  SECONDARY DIAGNOSIS:   Past Medical History:  Diagnosis Date  . Ankle fracture yrs ago   left   . Arthritis   . Asthma     hx of childhood asthma, none current  . Hyperlipidemia   . Hypothyroidism   . Morbid obesity 08/10/2012  . Morbid obesity (HCC)   . Syndesmotic ankle sprain yrs ago     ADMITTING HISTORY Steve Ashley  is a 52 y.o. male presents with chest pain this morning.  He felt like an elephant sitting on his chest.  Crushing pain stent of the chest without radiation 8 and a 10 intensity.  He drove his kids to school and tried to sit down for a little while but came into the ER for further evaluation.  In the ER his blood pressure was elevated.  He was started on nitroglycerin drip and brought his pain down to a 2 out of 10 in intensity.  When he had this episode he did not have any sweating or nausea or vomiting.  He did have shortness of breath.  Now he feels the shortness of breath is a little bit better.  Hospitalist services were contacted for further evaluation when the second troponin went up.   HOSPITAL COURSE:  Patient was admitted to stepdown unit.  Patient was anticoagulated with heparin drip.  Patient received metoprolol and HCTZ for blood pressure control.  He was started on Crestor.  Cardiology consultation was done and patient had cardiac cath which revealed no obstruction.  Blood  pressure was mostly controlled and patient will be discharged home with outpatient follow-up with cardiology in the clinic.  Disposition plan discussed with the patient in detail.  CONSULTS OBTAINED:  Treatment Team:  Marcina Millard, MD Uvaldo Rising, MD [facility], [facility], MD  DRUG ALLERGIES:   Allergies  Allergen Reactions  . Latex Itching    DISCHARGE MEDICATIONS:   Allergies as of 04/17/2018      Reactions   Latex Itching      Medication List    TAKE these medications   aspirin 81 MG EC tablet Take 1 tablet (81 mg total) by mouth daily.   diclofenac 75 MG EC tablet Commonly known as:  VOLTAREN Take 75 mg by mouth daily.   escitalopram 20 MG tablet Commonly known as:  LEXAPRO TAKE 1 TABLET BY MOUTH EVERY DAY   hydrochlorothiazide 25 MG tablet Commonly known as:  HYDRODIURIL Take 1 tablet (25 mg total) by mouth daily.   levothyroxine 88 MCG tablet Commonly known as:  SYNTHROID, LEVOTHROID TAKE 1 TABLET BY MOUTH EVERY DAY What changed:  when to take this   metoprolol tartrate 25 MG tablet Commonly known as:  LOPRESSOR Take 1 tablet (25 mg total) by mouth 2 (two) times daily.   rosuvastatin 40 MG tablet Commonly known as:  CRESTOR Take 1 tablet (40 mg total) by mouth daily at 6 PM.   tiZANidine 4 MG tablet Commonly known as:  ZANAFLEX Take 1 tablet (4 mg total) by mouth  Nightly.       Today  Patient seen and evaluated today No complaints of shortness of breath No chest pain Tolerating diet well  VITAL SIGNS:  Blood pressure 123/78, pulse 65, temperature 98.9 F (37.2 C), resp. rate 17, height 6' (1.829 m), weight (!) 159.7 kg, SpO2 97 %.  I/O:    Intake/Output Summary (Last 24 hours) at 04/17/2018 1408 Last data filed at 04/17/2018 1100 Gross per 24 hour  Intake 664.55 ml  Output 2800 ml  Net -2135.45 ml    PHYSICAL EXAMINATION:  Physical Exam  GENERAL:  52 y.o.-year-old patient lying in the bed with no acute distress.  LUNGS:  Normal breath sounds bilaterally, no wheezing, rales,rhonchi or crepitation. No use of accessory muscles of respiration.  CARDIOVASCULAR: S1, S2 normal. No murmurs, rubs, or gallops.  ABDOMEN: Soft, non-tender, non-distended. Bowel sounds present. No organomegaly or mass.  NEUROLOGIC: Moves all 4 extremities. PSYCHIATRIC: The patient is alert and oriented x 3.  SKIN: No obvious rash, lesion, or ulcer.   DATA REVIEW:   CBC Recent Labs  Lab 04/17/18 0501  WBC 8.4  HGB 13.9  HCT 43.6  PLT 280    Chemistries  Recent Labs  Lab 04/17/18 0501  NA 138  K 3.6  CL 103  CO2 27  GLUCOSE 114*  BUN 16  CREATININE 0.77  CALCIUM 9.0  MG 2.1    Cardiac Enzymes Recent Labs  Lab 04/16/18 1400  TROPONINI 0.66*    Microbiology Results  Results for orders placed or performed during the hospital encounter of 04/16/18  MRSA PCR Screening     Status: None   Collection Time: 04/16/18  6:23 PM  Result Value Ref Range Status   MRSA by PCR NEGATIVE NEGATIVE Final    Comment:        The GeneXpert MRSA Assay (FDA approved for NASAL specimens only), is one component of a comprehensive MRSA colonization surveillance program. It is not intended to diagnose MRSA infection nor to guide or monitor treatment for MRSA infections. Performed at [facility], [address] [place], [place] [address]     RADIOLOGY:  Dg Chest 2 View  Result Date: 04/16/2018 CLINICAL DATA:  Chest pain EXAM: CHEST - 2 VIEW COMPARISON:  April 30, 2015 FINDINGS: The lungs are clear. The heart size and pulmonary vascularity are within normal limits. No adenopathy. No evident bone lesions. No pneumothorax. IMPRESSION: No edema or consolidation. Electronically Signed   By: Bretta BangWilliam  Woodruff III M.D.   On: 04/16/2018 08:53   Ct Angio Chest Pe W And/or Wo Contrast  Result Date: 04/16/2018 CLINICAL DATA:  Chest pain EXAM: CT ANGIOGRAPHY CHEST WITH CONTRAST TECHNIQUE: Multidetector CT imaging of the  chest was performed using the standard protocol during bolus administration of intravenous contrast. Multiplanar CT image reconstructions and MIPs were obtained to evaluate the vascular anatomy. CONTRAST:  150 mL OMNIPAQUE IOHEXOL 350 MG/ML SOLN COMPARISON:  Chest radiograph April 16, 2018 FINDINGS: Cardiovascular: There is no demonstrable pulmonary embolus. There is no thoracic aortic aneurysm or dissection. The visualized great vessels appear normal. There is no pericardial effusion or pericardial thickening. There is a degree of left ventricular hypertrophy. There is prominence of the main pulmonary outflow tract measuring 3.4 cm. Mediastinum/Nodes: Thyroid appears unremarkable. There is no appreciable thoracic adenopathy. There is a small hiatal hernia. Lungs/Pleura: There is mild bibasilar atelectatic change. There is no appreciable lung edema or consolidation. On axial slice 21 series 11, there is a 2 mm  nodular opacity in the anterior segment right upper lobe. On axial slice 46 series 11, there is a 3 mm nodular opacity abutting the pleura in the lateral segment of the right middle lobe. No evident pleural effusion or pleural thickening. Upper Abdomen: Visualized upper abdominal structures appear unremarkable. Musculoskeletal: There are no blastic or lytic bone lesions. No evident chest wall lesions. Review of the MIP images confirms the above findings. IMPRESSION: 1. No demonstrable pulmonary embolus. There is no thoracic aortic aneurysm or dissection. 2. Prominence the main pulmonary outflow tract, a finding felt to be indicative of a degree of pulmonary arterial hypertension. 3.  There is left ventricular hypertrophy. 4. Bibasilar atelectasis. No edema or consolidation. Small nodular opacities on the right, largest measuring 3 mm. No follow-up needed if patient is low-risk (and has no known or suspected primary neoplasm). Non-contrast chest CT can be considered in 12 months if patient is high-risk. This  recommendation follows the consensus statement: Guidelines for Management of Incidental Pulmonary Nodules Detected on CT Images: From the Fleischner Society 2017; Radiology 2017; 284:228-243. 5.  No demonstrable thoracic adenopathy. 6.  Small hiatal hernia. Electronically Signed   By: Bretta Bang III M.D.   On: 04/16/2018 11:22    Follow up with PCP in 1 week.  Management plans discussed with the patient, family and they are in agreement.  CODE STATUS: Full code    Code Status Orders  (From admission, onward)         Start     Ordered   04/16/18 1241  Full code  Continuous     04/16/18 1240        Code Status History    Date Active Date Inactive Code Status Order ID Comments User Context   12/08/2011 1728 12/10/2011 1932 Full Code [id number]  Sherron Monday, RN Inpatient      TOTAL TIME TAKING CARE OF THIS PATIENT ON DAY OF DISCHARGE: more than 33 minutes.   Ihor Austin M.D on 04/17/2018 at 2:08 PM  Between 7am to 6pm - Pager - [phone]  After 6pm go to [email or web address] - password [email or web address]  [facility]  Office  [phone]  CC: Primary care physician; Hannah Beat, MD  Note: This dictation was prepared with Dragon dictation along with smaller phrase technology. Any transcriptional errors that result from this process are unintentional.

## 2018-04-17 NOTE — Progress Notes (Signed)
[facility]  SUBJECTIVE: The patient is status post cardiac catheterization this morning, and reports feeling fine. He denies chest pain or shortness of breath.    Vitals:   04/17/18 1000 04/17/18 1015 04/17/18 1030 04/17/18 1100  BP: 133/78  123/74 123/78  Pulse: 65 60 65   Resp: 11 18 20 17   Temp:    98.9 F (37.2 C)  TempSrc:      SpO2: 100% 96% 97% 97%  Weight:      Height:         Intake/Output Summary (Last 24 hours) at 04/17/2018 1236 Last data filed at 04/17/2018 1100 Gross per 24 hour  Intake 728.14 ml  Output 2800 ml  Net -2071.86 ml      PHYSICAL EXAM  General: Well developed, well nourished, in no acute distress HEENT:  Normocephalic and atramatic Neck:  No JVD.  Lungs: normal effort of breathing Heart: HRRR . Normal S1 and S2 without gallops or murmurs.  Abdomen: Bowel sounds are positive, abdomen soft and non-tender  Msk:  Back normal, gait not assessed.  Extremities: No clubbing, cyanosis or edema.   Neuro: Alert and oriented X 3. Psych:  Good affect, responds appropriately   LABS: Basic Metabolic Panel: Recent Labs    04/16/18 0834 04/17/18 0501  NA 142 138  K 4.1 3.6  CL 105 103  CO2 32 27  GLUCOSE 110* 114*  BUN 16 16  CREATININE 1.11 0.77  CALCIUM 9.1 9.0  MG  --  2.1  PHOS  --  4.0   Liver Function Tests: No results for input(s): AST, ALT, ALKPHOS, BILITOT, PROT, ALBUMIN in the last 72 hours. No results for input(s): LIPASE, AMYLASE in the last 72 hours. CBC: Recent Labs    04/16/18 0834 04/17/18 0501  WBC 7.2 8.4  HGB 14.9 13.9  HCT 46.5 43.6  MCV 87.4 87.4  PLT 290 280   Cardiac Enzymes: Recent Labs    04/16/18 0834 04/16/18 1041 04/16/18 1400  TROPONINI 0.03* 0.14* 0.66*   BNP: Invalid input(s): POCBNP D-Dimer: No results for input(s): DDIMER in the last 72 hours. Hemoglobin A1C: No results for input(s): HGBA1C in the last 72 hours. Fasting Lipid Panel: Recent Labs    04/17/18 0501  CHOL 238*  HDL  38*  LDLCALC 154*  TRIG 231*  CHOLHDL 6.3   Thyroid Function Tests: No results for input(s): TSH, T4TOTAL, T3FREE, THYROIDAB in the last 72 hours.  Invalid input(s): FREET3 Anemia Panel: No results for input(s): VITAMINB12, FOLATE, FERRITIN, TIBC, IRON, RETICCTPCT in the last 72 hours.  Dg Chest 2 View  Result Date: 04/16/2018 CLINICAL DATA:  Chest pain EXAM: CHEST - 2 VIEW COMPARISON:  April 30, 2015 FINDINGS: The lungs are clear. The heart size and pulmonary vascularity are within normal limits. No adenopathy. No evident bone lesions. No pneumothorax. IMPRESSION: No edema or consolidation. Electronically Signed   By: Bretta BangWilliam  Woodruff III M.D.   On: 04/16/2018 08:53   Ct Angio Chest Pe W And/or Wo Contrast  Result Date: 04/16/2018 CLINICAL DATA:  Chest pain EXAM: CT ANGIOGRAPHY CHEST WITH CONTRAST TECHNIQUE: Multidetector CT imaging of the chest was performed using the standard protocol during bolus administration of intravenous contrast. Multiplanar CT image reconstructions and MIPs were obtained to evaluate the vascular anatomy. CONTRAST:  150 mL OMNIPAQUE IOHEXOL 350 MG/ML SOLN COMPARISON:  Chest radiograph April 16, 2018 FINDINGS: Cardiovascular: There is no demonstrable pulmonary embolus. There is no thoracic aortic aneurysm or dissection. The visualized great vessels  appear normal. There is no pericardial effusion or pericardial thickening. There is a degree of left ventricular hypertrophy. There is prominence of the main pulmonary outflow tract measuring 3.4 cm. Mediastinum/Nodes: Thyroid appears unremarkable. There is no appreciable thoracic adenopathy. There is a small hiatal hernia. Lungs/Pleura: There is mild bibasilar atelectatic change. There is no appreciable lung edema or consolidation. On axial slice 21 series 11, there is a 2 mm nodular opacity in the anterior segment right upper lobe. On axial slice 46 series 11, there is a 3 mm nodular opacity abutting the pleura in the  lateral segment of the right middle lobe. No evident pleural effusion or pleural thickening. Upper Abdomen: Visualized upper abdominal structures appear unremarkable. Musculoskeletal: There are no blastic or lytic bone lesions. No evident chest wall lesions. Review of the MIP images confirms the above findings. IMPRESSION: 1. No demonstrable pulmonary embolus. There is no thoracic aortic aneurysm or dissection. 2. Prominence the main pulmonary outflow tract, a finding felt to be indicative of a degree of pulmonary arterial hypertension. 3.  There is left ventricular hypertrophy. 4. Bibasilar atelectasis. No edema or consolidation. Small nodular opacities on the right, largest measuring 3 mm. No follow-up needed if patient is low-risk (and has no known or suspected primary neoplasm). Non-contrast chest CT can be considered in 12 months if patient is high-risk. This recommendation follows the consensus statement: Guidelines for Management of Incidental Pulmonary Nodules Detected on CT Images: From the Fleischner Society 2017; Radiology 2017; 284:228-243. 5.  No demonstrable thoracic adenopathy. 6.  Small hiatal hernia. Electronically Signed   By: Bretta Bang III M.D.   On: 04/16/2018 11:22     Echo Pending  TELEMETRY: sinus rhythm   ASSESSMENT AND PLAN:  Active Problems:   Accelerated hypertension   NSTEMI (non-ST elevated myocardial infarction) (HCC)    1. Chest pain with elevated troponin, in the setting of accelerated hypertension, with insignificant coronary artery disease per cardiac catheterization this morning. 2. Accelerated hypertension, much improved 3. Hyperlipidemia, LDL cholesterol 154 4. Obesity  Recommendations: 1. The patient may be discharged from a cardiovascular perspective after echocardiogram, as he is stable, cardiac catheterization reveals insignificant CAD, and his blood pressure has stabilized. 2. Crestor 40 mg daily 3. Continue metoprolol and  hydrochlorothiazide. 4. 2D echocardiogram today 5. Encouraged patient to exercise/lose weight 6. Follow-up with [facility] in 1 week  Steve Ashley, [place] 04/17/2018 12:36 PM

## 2018-04-17 NOTE — Progress Notes (Signed)
Discharged home with friend.

## 2018-04-17 NOTE — Consult Note (Signed)
ANTICOAGULATION CONSULT NOTE - Initial Consult  Pharmacy Consult for Heparin Indication: NSTEMI  Allergies  Allergen Reactions  . Latex Itching    Patient Measurements: Height: 6' (182.9 cm) Weight: (!) 352 lb (159.7 kg) IBW/kg (Calculated) : 77.6 Heparin Dosing Weight: 115.8 kg  Vital Signs: Temp: 98.2 F (36.8 C) (11/19 0200) Temp Source: Axillary (11/19 0200) BP: 131/85 (11/19 0500) Pulse Rate: 64 (11/19 0500)  Labs: Recent Labs    04/16/18 0834 04/16/18 0907 04/16/18 1041 04/16/18 1400 04/16/18 1903 04/17/18 0103 04/17/18 0501  HGB 14.9  --   --   --   --   --  13.9  HCT 46.5  --   --   --   --   --  43.6  PLT 290  --   --   --   --   --  280  APTT  --  31  --   --   --   --   --   LABPROT  --  11.8  --   --   --   --   --   INR  --  0.87  --   --   --   --   --   HEPARINUNFRC  --   --   --   --  0.36 0.42 0.28*  CREATININE 1.11  --   --   --   --   --  0.77  TROPONINI 0.03*  --  0.14* 0.66*  --   --   --     Estimated Creatinine Clearance: 166.8 mL/min (by C-G formula based on SCr of 0.77 mg/dL).   Medical History: Past Medical History:  Diagnosis Date  . Ankle fracture yrs ago   left   . Arthritis   . Asthma     hx of childhood asthma, none current  . Hyperlipidemia   . Hypothyroidism   . Morbid obesity 08/10/2012  . Morbid obesity (HCC)   . Syndesmotic ankle sprain yrs ago    Medications:  Scheduled:  . aspirin EC  81 mg Oral Daily  . escitalopram  20 mg Oral Daily  . hydrochlorothiazide  25 mg Oral Daily  . levothyroxine  88 mcg Oral QAC breakfast  . metoprolol tartrate  25 mg Oral BID  . nitroGLYCERIN  1 inch Topical Once  . rosuvastatin  40 mg Oral q1800  . sodium chloride flush  3 mL Intravenous Q12H  . tiZANidine  4 mg Oral Nightly    Assessment: 52 yo male presented on 11/18 with chest pain, diagnosed with NSTEMI. PMH includes hyperlipidemia, hypothyroidism, and asthma.  Patient not on any PTA anticoagulants.    Goal of  Therapy:  Heparin level 0.3-0.7 units/ml Monitor platelets by anticoagulation protocol: Yes   Plan:  11/19 @ 0500 HL 0.28 subtherapeutic. Heparin drip is going to be stopped @ 0730 for heart cath. F/u on plan post-cath.  Thomasene Rippleavid  Marbella Markgraf, PharmD Clinical Pharmacist 04/17/2018 6:12 AM

## 2018-04-17 NOTE — Progress Notes (Addendum)
Follow up with Dr. Cassie FreerParachos December 3 at 300pm. Discharge teaching done and information on cardiac catherization post op,home medication management,and coronary artery syndrome given to patient. Information sheets on new medications given and questions answered.

## 2018-04-17 NOTE — Discharge Instructions (Addendum)
Acute Coronary Syndrome °Acute coronary syndrome (ACS) is a serious problem in which there is suddenly not enough blood and oxygen reaching the heart. ACS can result in chest pain or a heart attack. °What are the causes? °This condition may be caused by: °· A buildup of fat and cholesterol inside of the arteries (atherosclerosis). This is the most common cause. The buildup (plaque) can cause the blood vessels in your heart (coronary arteries) to become narrow or blocked. Plaque can also break off to form a clot. °· A coronary spasm. °· A tearing of the coronary artery (spontaneous coronary artery dissection). °· Low blood pressure (hypotension). °· An abnormal heart beat (arrhythmia). °· Using cocaine or methamphetamine. ° °What increases the risk? °The following factors may make you more likely to develop this condition: °· Age. °· History of chest pain, heart attack, or stroke. °· Being male. °· Family history of chest pain, heart disease, or stroke. °· Smoking. °· Inactivity. °· Being overweight. °· High cholesterol. °· High blood pressure (hypertension). °· Diabetes. °· Excessive alcohol use. ° °What are the signs or symptoms? °Common symptoms of this condition include: °· Chest pain. The pain may last long, or may stop and come back (recur). It may feel like: °? Crushing or squeezing. °? Tightness, pressure, fullness, or heaviness. °· Arm, neck, jaw, or back pain. °· Heartburn or indigestion. °· Shortness of breath. °· Nausea. °· Sudden cold sweats. °· Lightheadedness. °· Dizziness. °· Tiredness (fatigue). ° °Sometimes there are no symptoms. °How is this diagnosed? °This condition may be diagnosed through: °· An electrocardiogram (ECG). This test records the impulses of the heart. °· Blood tests. °· A CT scan of the chest. °· A coronary angiogram. This procedure checks for a blockage in the coronary arteries. ° °How is this treated? °Treatment for this condition may include: °· Oxygen. °· Medicines, such  as: °? Antiplatelet medicines and blood-thinning medicines, such as aspirin. These help prevent blood clots. °? Fibrinolytic therapy. This breaks apart a blood clot. °? Blood pressure medicines. °? Nitroglycerin. °? Pain medicine. °? Cholesterol medicine. °· A procedure called coronary angioplasty and stenting. This is done to widen a narrowed artery and keep it open. °· Coronary artery bypass surgery. This allows blood to pass the blockage to reach your heart. °· Cardiac rehabilitation. This is a program that helps improve your health and well-being. It includes exercise training, education, and counseling to help you recover. ° °Follow these instructions at home: °Eating and drinking °· Follow a heart-healthy, low-salt (sodium) diet. °· Use healthy cooking methods such as roasting, grilling, broiling, baking, poaching, steaming, or stir-frying. °· Talk to a dietitian to learn about healthy cooking methods and how to eat less sodium. °Medicines °· Take over-the-counter and prescription medicines only as told by your health care provider. °· Do not take these medicines unless your health care provider approves: °? Nonsteroidal anti-inflammatory drugs (NSAIDs), such as ibuprofen, naproxen, or celecoxib. °? Vitamin supplements that contain vitamin A or vitamin E. °? Hormone replacement therapy that contains estrogen. °Activity °· Join a cardiac rehabilitation program. °· Ask your health care provider: °? What activities and exercises are safe for you. °? If you should follow specific instructions about lifting, driving, or climbing stairs. °· If you are taking aspirin and another blood thinning medicine, avoid activities that are likely to result in an injury. The medicines can increase your risk of bleeding. °Lifestyle °· Do not use any products that contain nicotine or tobacco, such as cigarettes   and e-cigarettes. If you need help quitting, ask your health care provider. °· If you drink alcohol and your health care  provider says it is okay to drink, limit your alcohol intake to no more than 1 drink per day. One drink equals 12 oz of beer, 5 oz of wine, or 1½ oz of hard liquor. °· Maintain a healthy weight. If you need to lose weight, do it in a way that has been approved by your health care provider. °General instructions °· Tell all your health care providers about your heart condition, including your dentist. Some medicines can increase your risk of arrhythmia. °· Manage other health conditions, such as hypertension and diabetes. These conditions affect your heart. °· Learn ways to manage stress. °· Get screened for depression, and seek treatment if needed. °· Monitor your blood pressure if told by your health care provider. °· Keep your vaccinations up to date. Get the annual influenza vaccine. °· Keep all follow-up visits as told by your health care provider. This is important. °Contact a health care provider if: °· You feel overwhelmed or sad. °· You have trouble with your daily activities. °Get help right away if: °· You have pain in your chest, neck, arm, jaw, stomach, or back that recurs, and: °? Lasts more than a few minutes. °? Is not relieved by taking the medicineyour health care provider prescribed. °· You have unexplained: °? Heavy sweating. °? Heartburn or indigestion. °? Shortness of breath. °? Difficulty breathing. °? Nausea or vomiting. °? Fatigue. °? Nervousness or anxiety. °? Weakness. °? Diarrhea. °? Dark stools or blood in the stool. °· You have sudden lightheadedness or dizziness. °· Your blood pressure is higher than 180/120 °· You faint. °· You feel like hurting yourself or think about taking your own life. °These symptoms may represent a serious problem that is an emergency. Do not wait to see if the symptoms will go away. Get medical help right away. Call your local emergency services (911 in the [place] Do not drive yourself to the clinic or hospital. °Summary °· Acute coronary syndrome (ACS) is a  when there is not enough blood and oxygen being supplied to the heart. ACS can result in chest pain or a heart attack. °· Acute coronary syndrome is a medical emergency. If you have any symptoms of this condition, get help right away. °· Treatment includes oxygen, medicines, and procedures to open the blocked arteries and restore blood flow. °This information is not intended to replace advice given to you by your health care provider. Make sure you discuss any questions you have with your health care provider. °Document Released: 05/16/2005 Document Revised: 06/17/2016 Document Reviewed: 06/17/2016 °Elsevier Interactive Patient Education © 2018 [employer] ° ° °Aspirin and Your Heart °Aspirin is a medicine that affects the way blood clots. Aspirin can be used to help reduce the risk of blood clots, heart attacks, and other heart-related problems. °Should I take aspirin? °Your health care provider will help you determine whether it is safe and beneficial for you to take aspirin daily. Taking aspirin daily may be beneficial if you: °· Have had a heart attack or chest pain. °· Have undergone open heart surgery such as coronary artery bypass surgery (CABG). °· Have had coronary angioplasty. °· Have experienced a stroke or transient ischemic attack (TIA). °· Have peripheral vascular disease (PVD). °· Have chronic heart rhythm problems such as atrial fibrillation. ° °Are there any risks of taking aspirin daily? °Daily use of aspirin can   increase your risk of side effects. Some of these include: °· Bleeding. Bleeding problems can be minor or serious. An example of a minor problem is a cut that does not stop bleeding. An example of a more serious problem is stomach bleeding or bleeding into the brain. Your risk of bleeding is increased if you are also taking non-steroidal anti-inflammatory medicine (NSAIDs). °· Increased bruising. °· Upset stomach. °· An allergic reaction. People who have nasal polyps have an increased  risk of developing an aspirin allergy. ° °What are some guidelines I should follow when taking aspirin? °· Take aspirin only as directed by your health care provider. Make sure you understand how much you should take and what form you should take. The two forms of aspirin are: °? Non-enteric-coated. This type of aspirin does not have a coating and is absorbed quickly. Non-enteric-coated aspirin is usually recommended for people with chest pain. This type of aspirin also comes in a chewable form. °? Enteric-coated. This type of aspirin has a special coating that releases the medicine very slowly. Enteric-coated aspirin causes less stomach upset than non-enteric-coated aspirin. This type of aspirin should not be chewed or crushed. °· Drink alcohol in moderation. Drinking alcohol increases your risk of bleeding. °When should I seek medical care? °· You have unusual bleeding or bruising. °· You have stomach pain. °· You have an allergic reaction. Symptoms of an allergic reaction include: °? Hives. °? Itchy skin. °? Swelling of the lips, tongue, or face. °· You have ringing in your ears. °When should I seek immediate medical care? °· Your bowel movements are bloody, dark red, or black in color. °· You vomit or cough up blood. °· You have blood in your urine. °· You cough, wheeze, or feel short of breath. °If you have any of the following symptoms, this is an emergency. Do not wait to see if the pain will go away. Get medical help at once. Call your local emergency services (911 in the [place] Do not drive yourself to the hospital. °· You have severe chest pain, especially if the pain is crushing or pressure-like and spreads to the arms, back, neck, or jaw. °· You have stroke-like symptoms, such as: °? Loss of vision. °? Difficulty talking. °? Numbness or weakness on one side of your body. °? Numbness or weakness in your arm or leg. °? Not thinking clearly or feeling confused. ° °This information is not intended to replace  advice given to you by your health care provider. Make sure you discuss any questions you have with your health care provider. °Document Released: 04/28/2008 Document Revised: 09/23/2015 Document Reviewed: 08/21/2013 °Elsevier Interactive Patient Education © 2018 Else[employer] Inc[employer] ° °

## 2018-04-17 NOTE — Progress Notes (Signed)
Advanced care plan.  Purpose of the Encounter: CODE STATUS  Parties in Attendance: Patient  Patient's Decision Capacity: Good  Subjective/Patient's story: Presented to emergency room for chest pain elevated blood pressure   Objective/Medical story Needs cardiology evaluation and cath and intervention and control of blood pressure   Goals of care determination:  Advance care directives and goals of care discussed Patient wants everything done which includes CPR, intubation ventilator the need arises   CODE STATUS: Full code   Time spent discussing advanced care planning: 16 minutes

## 2018-04-18 ENCOUNTER — Telehealth: Payer: Self-pay

## 2018-04-18 LAB — CALCIUM, IONIZED: Calcium, Ionized, Serum: 4.7 mg/dL (ref 4.5–5.6)

## 2018-04-18 NOTE — Telephone Encounter (Signed)
Left message for patient to call back. Need to schedule for hospital follow up. Dr. Cyndie Chimeopland's patient and I spoke with    Dr. Sharen HonesGutierrez and he agreed to see patient for this. Not able to do TCM call due to insurance-[employer]. Thank you

## 2018-04-19 NOTE — Telephone Encounter (Signed)
Spoke with patient and appointment made to see Dr Reece AgarG.

## 2018-04-20 ENCOUNTER — Other Ambulatory Visit: Payer: Self-pay | Admitting: Family Medicine

## 2018-04-20 NOTE — Telephone Encounter (Signed)
Last office visit 12/28/2017 for CPE.  Last refilled 08/12/2017 for #30 with 1 refill.  Next Appt: 04/25/2018 with Dr. Reece AgarG for Hospital follow up.

## 2018-04-25 ENCOUNTER — Encounter: Payer: Self-pay | Admitting: Family Medicine

## 2018-04-25 ENCOUNTER — Encounter: Payer: Self-pay | Admitting: *Deleted

## 2018-04-25 ENCOUNTER — Ambulatory Visit: Payer: BC Managed Care – PPO | Admitting: Family Medicine

## 2018-04-25 VITALS — BP 136/70 | HR 84 | Temp 98.2°F | Ht 70.5 in | Wt 350.2 lb

## 2018-04-25 DIAGNOSIS — E785 Hyperlipidemia, unspecified: Secondary | ICD-10-CM

## 2018-04-25 DIAGNOSIS — I214 Non-ST elevation (NSTEMI) myocardial infarction: Secondary | ICD-10-CM | POA: Diagnosis not present

## 2018-04-25 DIAGNOSIS — G4733 Obstructive sleep apnea (adult) (pediatric): Secondary | ICD-10-CM | POA: Diagnosis not present

## 2018-04-25 DIAGNOSIS — I10 Essential (primary) hypertension: Secondary | ICD-10-CM

## 2018-04-25 LAB — BASIC METABOLIC PANEL
BUN: 21 mg/dL (ref 6–23)
CO2: 29 mEq/L (ref 19–32)
Calcium: 9.5 mg/dL (ref 8.4–10.5)
Chloride: 103 mEq/L (ref 96–112)
Creatinine, Ser: 1.06 mg/dL (ref 0.40–1.50)
GFR: 93.78 mL/min (ref >60.00)
Glucose, Bld: 127 mg/dL — ABNORMAL HIGH (ref 70–99)
Potassium: 3.5 mEq/L (ref 3.5–5.1)
Sodium: 139 mEq/L (ref 135–145)

## 2018-04-25 NOTE — Assessment & Plan Note (Signed)
Catheterization reassuringly with no obstructive CAD.  Now on aspirin 81mg  and crestor 40mg  daily. Continue. Has f/u scheduled with cardiology next week.  I advised he could drop off [employer] forms if needed and we can fill out if cards doesn't.  Will await cards clearance to return to work, as well as recs on upcoming shoulder surgery (was scheduled for 05/15/2018)

## 2018-04-25 NOTE — Assessment & Plan Note (Addendum)
H/o this, was not using CPAP. CTA suggestive of pulm arterial hypertension.  Has restarted CPAP, regularly using over the past week.

## 2018-04-25 NOTE — Assessment & Plan Note (Signed)
Encouraged ongoing efforts at sustainable weight loss through healthy diet and lifestyle changes.  

## 2018-04-25 NOTE — Assessment & Plan Note (Signed)
No significant hx HTN in the past that I can tell, but BP was very elevated last week leading to HTN emergency with NSTEMI. Now on hctz and metoprolol and tolerating both well (some fatigue endorsed). Will continue this as well as recommended limiting salt/sodium in diet. DASH diet handout provided today.

## 2018-04-25 NOTE — Progress Notes (Signed)
BP 136/70 (BP Location: Right Arm, Patient Position: Sitting, Cuff Size: Large)   Pulse 84   Temp 98.2 F (36.8 C) (Oral)   Ht 5' 10.5" (1.791 m)   Wt (!) 350 lb 4 oz (158.9 kg)   SpO2 94%   BMI 49.55 kg/m    CC: hosp f/u visit Subjective:    Patient ID: Bud Face, male    DOB: 01-20-1966, 52 y.o.   MRN: [id number]  HPI: TEANDRE HAMRE is a 52 y.o. male presenting on 04/25/2018 for Hospitalization Follow-up   Patient of Dr Cyndie Chime here for hospital f/u visit after recent hospitalization for crushing substernal chest pain found to have NSTEMI, troponin returned elevated, treated in ER with nitro drip, placed on heparin drip. Admitted to stepdown unit. Cardiac catheterization reassuringly revealed no obstructive disease. He was told he had sodium-induced myocardial infarction. TnI peaked at 0.66. BP peaked at 190/137.   Hospital records reviewed:  CTA showed no PE, prominent pulmonary outflow tract suggestive of pulm arterial hypertension, LVH.  Echocardiogram was overall normal. Catheterization showed insignificant CAD, normal LV function.   Started on aspirin, hctz 25mg  daily, metoprolol 25mg  bid, crestor 40mg  daily   Has f/u scheduled with Dr Darrold Junker cardiology next week.  Prior to hospitalization was eating out a lot. 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 Night before symptoms he had church potluck.   Since out of the hospital working on lower sodium diet. Since home, denies chest pain, tightness, dyspnea, or leg swelling.  DATE OF ADMISSION:  04/16/2018  DATE OF DISCHARGE: 04/17/2018 TCM hosp f/u visit not completed.   D/C Dx:  NSTEMI (non-ST elevated myocardial infarction) (HCC) Accelerated hypertension Uncontrolled hypertension Hyperlipidemia Hypothyroidism  Relevant past medical, surgical, family and social history reviewed and updated as indicated. Interim medical history since our last visit  reviewed. Allergies and medications reviewed and updated. Discharge med list reconciled with current home medication list Outpatient Medications Prior to Visit  Medication Sig Dispense Refill  . aspirin EC 81 MG EC tablet Take 1 tablet (81 mg total) by mouth daily. 30 tablet 0  . diclofenac (VOLTAREN) 75 MG EC tablet Take 75 mg by mouth daily.    Marland Kitchen escitalopram (LEXAPRO) 20 MG tablet TAKE 1 TABLET BY MOUTH EVERY DAY (Patient taking differently: Take 20 mg by mouth daily. ) 90 tablet 1  . hydrochlorothiazide (HYDRODIURIL) 25 MG tablet Take 1 tablet (25 mg total) by mouth daily. 30 tablet 0  . levothyroxine (SYNTHROID, LEVOTHROID) 88 MCG tablet TAKE 1 TABLET BY MOUTH EVERY DAY (Patient taking differently: Take 88 mcg by mouth daily before breakfast. ) 90 tablet 3  . metoprolol tartrate (LOPRESSOR) 25 MG tablet Take 1 tablet (25 mg total) by mouth 2 (two) times daily. 60 tablet 0  . rosuvastatin (CRESTOR) 40 MG tablet Take 1 tablet (40 mg total) by mouth daily at 6 PM. 30 tablet 0  . tiZANidine (ZANAFLEX) 4 MG tablet TAKE 1 TABLET (4 MG TOTAL) BY MOUTH NIGHTLY. 30 tablet 1  . tiZANidine (ZANAFLEX) 4 MG tablet Take 1 tablet (4 mg total) by mouth Nightly. 30 tablet 1   No facility-administered medications prior to visit.      Per HPI unless specifically indicated in ROS section below Review of Systems     Objective:    BP 136/70 (BP Location: Right Arm, Patient Position: Sitting, Cuff Size: Large)   Pulse 84   Temp 98.2  F (36.8 C) (Oral)   Ht 5' 10.5" (1.791 m)   Wt (!) 350 lb 4 oz (158.9 kg)   SpO2 94%   BMI 49.55 kg/m   Wt Readings from Last 3 Encounters:  04/25/18 (!) 350 lb 4 oz (158.9 kg)  04/16/18 (!) 352 lb (159.7 kg)  12/28/17 (!) 341 lb 8 oz (154.9 kg)    Physical Exam  Constitutional: He appears well-developed and well-nourished. No distress.  HENT:  Mouth/Throat: Oropharynx is clear and moist. No oropharyngeal exudate.  Eyes: Pupils are equal, round, and reactive to  light. EOM are normal.  Cardiovascular: Normal rate, regular rhythm and normal heart sounds.  No murmur heard. Pulmonary/Chest: Effort normal and breath sounds normal. No respiratory distress. He has no wheezes. He has no rales.  Musculoskeletal: He exhibits no edema.  Psychiatric: He has a normal mood and affect.  Nursing note and vitals reviewed.  Lab Results  Component Value Date   TSH 3.30 12/25/2017    Lab Results  Component Value Date   CHOL 238 (H) 04/17/2018   HDL 38 (L) 04/17/2018   LDLCALC 154 (H) 04/17/2018   LDLDIRECT 175.0 10/14/2015   TRIG 231 (H) 04/17/2018   CHOLHDL 6.3 04/17/2018    Lab Results  Component Value Date   CREATININE 0.77 04/17/2018   BUN 16 04/17/2018   NA 138 04/17/2018   K 3.6 04/17/2018   CL 103 04/17/2018   CO2 27 04/17/2018       Assessment & Plan:   Problem List Items Addressed This Visit    Severe obesity (BMI >= 40) (HCC)    Encouraged ongoing efforts at sustainable weight loss through healthy diet and lifestyle changes       OSA (obstructive sleep apnea)    H/o this, was not using CPAP. CTA suggestive of pulm arterial hypertension.  Has restarted CPAP, regularly using over the past week.       NSTEMI (non-ST elevated myocardial infarction) [facility](HCC) - Primary    Catheterization reassuringly with no obstructive CAD.  Now on aspirin 81mg  and crestor 40mg  daily. Continue. Has f/u scheduled with cardiology next week.  I advised he could drop off [employer] forms if needed and we can fill out if cards doesn't.  Will await cards clearance to return to work, as well as recs on upcoming shoulder surgery (was scheduled for 05/15/2018)      Relevant Orders   Basic metabolic panel   Hyperlipidemia LDL goal <70    Started on crestor. Continue this. The ASCVD Risk score Denman George(Goff DC Jr., et al., 2013) failed to calculate for the following reasons:   The patient has a prior MI or stroke diagnosis       Accelerated hypertension    No significant  hx HTN in the past that I can tell, but BP was very elevated last week leading to HTN emergency with NSTEMI. Now on hctz and metoprolol and tolerating both well (some fatigue endorsed). Will continue this as well as recommended limiting salt/sodium in diet. DASH diet handout provided today.           No orders of the defined types were placed in this encounter.  Orders Placed This Encounter  Procedures  . Basic metabolic panel    Follow up plan: Return if symptoms worsen or fail to improve.  Eustaquio BoydenJavier Kenneth Cuaresma, MD

## 2018-04-25 NOTE — Assessment & Plan Note (Signed)
Started on crestor. Continue this. The ASCVD Risk score Steve Ashley(Goff DC Jr., et al., 2013) failed to calculate for the following reasons:   The patient has a prior MI or stroke diagnosis

## 2018-04-25 NOTE — Patient Instructions (Addendum)
You are doing well today. Continue current medicines. Keep appointment with Dr Darrold JunkerParaschos next week. I anticipate return to work will be after cardiology appointment.  If you need us to fill out FMLA form, just drop off.   DASH Eating Plan DASH stands for "Dietary Approaches to Stop Hypertension." The DASH eating plan is a healthy eating plan that has been shown to reduce high blood pressure (hypertension). It may also reduce your risk for type 2 diabetes, heart disease, and stroke. The DASH eating plan may also help with weight loss. What are tips for following this plan? General guidelines  Avoid eating more than 2,300 mg (milligrams) of salt (sodium) a day. If you have hypertension, you may need to reduce your sodium intake to 1,500 mg a day.  Limit alcohol intake to no more than 1 drink a day for nonpregnant women and 2 drinks a day for men. One drink equals 12 oz of beer, 5 oz of wine, or 1 oz of hard liquor.  Work with your health care provider to maintain a healthy body weight or to lose weight. Ask what an ideal weight is for you.  Get at least 30 minutes of exercise that causes your heart to beat faster (aerobic exercise) most days of the week. Activities may include walking, swimming, or biking.  Work with your health care provider or diet and nutrition specialist (dietitian) to adjust your eating plan to your individual calorie needs. Reading food labels  Check food labels for the amount of sodium per serving. Choose foods with less than 5 percent of the Daily Value of sodium. Generally, foods with less than 300 mg of sodium per serving fit into this eating plan.  To find whole grains, look for the word "whole" as the first word in the ingredient list. Shopping  Buy products labeled as "low-sodium" or "no salt added."  Buy fresh foods. Avoid canned foods and premade or frozen meals. Cooking  Avoid adding salt when cooking. Use salt-free seasonings or herbs instead of table  salt or sea salt. Check with your health care provider or pharmacist before using salt substitutes.  Do not fry foods. Cook foods using healthy methods such as baking, boiling, grilling, and broiling instead.  Cook with heart-healthy oils, such as olive, canola, soybean, or sunflower oil. Meal planning   Eat a balanced diet that includes: ? 5 or more servings of fruits and vegetables each day. At each meal, try to fill half of your plate with fruits and vegetables. ? Up to 6-8 servings of whole grains each day. ? Less than 6 oz of lean meat, poultry, or fish each day. A 3-oz serving of meat is about the same size as a deck of cards. One egg equals 1 oz. ? 2 servings of low-fat dairy each day. ? A serving of nuts, seeds, or beans 5 times each week. ? Heart-healthy fats. Healthy fats called Omega-3 fatty acids are found in foods such as flaxseeds and coldwater fish, like sardines, salmon, and mackerel.  Limit how much you eat of the following: ? Canned or prepackaged foods. ? Food that is high in trans fat, such as fried foods. ? Food that is high in saturated fat, such as fatty meat. ? Sweets, desserts, sugary drinks, and other foods with added sugar. ? Full-fat dairy products.  Do not salt foods before eating.  Try to eat at least 2 vegetarian meals each week.  Eat more home-cooked food and less restaurant, buffet, and fast  food.  When eating at a restaurant, ask that your food be prepared with less salt or no salt, if possible. What foods are recommended? The items listed may not be a complete list. Talk with your dietitian about what dietary choices are best for you. Grains Whole-grain or whole-wheat bread. Whole-grain or whole-wheat pasta. Brown rice. [place] Morrow. Bulgur. Whole-grain and low-sodium cereals. Pita bread. Low-fat, low-sodium crackers. Whole-wheat flour tortillas. Vegetables Fresh or frozen vegetables (raw, steamed, roasted, or grilled). Low-sodium or  reduced-sodium tomato and vegetable juice. Low-sodium or reduced-sodium tomato sauce and tomato paste. Low-sodium or reduced-sodium canned vegetables. Fruits All fresh, dried, or frozen fruit. Canned fruit in natural juice (without added sugar). Meat and other protein foods Skinless chicken or [place]. Ground chicken or [place]. Pork with fat trimmed off. Fish and seafood. Egg whites. Dried beans, peas, or lentils. Unsalted nuts, nut butters, and seeds. Unsalted canned beans. Lean cuts of beef with fat trimmed off. Low-sodium, lean deli meat. Dairy Low-fat (1%) or fat-free (skim) milk. Fat-free, low-fat, or reduced-fat cheeses. Nonfat, low-sodium ricotta or cottage cheese. Low-fat or nonfat yogurt. Low-fat, low-sodium cheese. Fats and oils Soft margarine without trans fats. Vegetable oil. Low-fat, reduced-fat, or light mayonnaise and salad dressings (reduced-sodium). Canola, safflower, olive, soybean, and sunflower oils. Avocado. Seasoning and other foods Herbs. Spices. Seasoning mixes without salt. Unsalted popcorn and pretzels. Fat-free sweets. What foods are not recommended? The items listed may not be a complete list. Talk with your dietitian about what dietary choices are best for you. Grains Baked goods made with fat, such as croissants, muffins, or some breads. Dry pasta or rice meal packs. Vegetables Creamed or fried vegetables. Vegetables in a cheese sauce. Regular canned vegetables (not low-sodium or reduced-sodium). Regular canned tomato sauce and paste (not low-sodium or reduced-sodium). Regular tomato and vegetable juice (not low-sodium or reduced-sodium). Angie Fava. Olives. Fruits Canned fruit in a light or heavy syrup. Fried fruit. Fruit in cream or butter sauce. Meat and other protein foods Fatty cuts of meat. Ribs. Fried meat. Berniece Salines. Sausage. Bologna and other processed lunch meats. Salami. Fatback. Hotdogs. Bratwurst. Salted nuts and seeds. Canned beans with added salt. Canned or  smoked fish. Whole eggs or egg yolks. Chicken or [place] with skin. Dairy Whole or 2% milk, cream, and half-and-half. Whole or full-fat cream cheese. Whole-fat or sweetened yogurt. Full-fat cheese. Nondairy creamers. Whipped toppings. Processed cheese and cheese spreads. Fats and oils Butter. Stick margarine. Lard. Shortening. Ghee. Bacon fat. Tropical oils, such as coconut, palm kernel, or palm oil. Seasoning and other foods Salted popcorn and pretzels. Onion salt, garlic salt, seasoned salt, table salt, and sea salt. Worcestershire sauce. Tartar sauce. Barbecue sauce. Teriyaki sauce. Soy sauce, including reduced-sodium. Steak sauce. Canned and packaged gravies. Fish sauce. Oyster sauce. Cocktail sauce. Horseradish that you find on the shelf. Ketchup. Mustard. Meat flavorings and tenderizers. Bouillon cubes. Hot sauce and Tabasco sauce. Premade or packaged marinades. Premade or packaged taco seasonings. Relishes. Regular salad dressings. Where to find more information:  National Heart, Lung, and [place]: https://wilson-eaton.com/  American Heart Association: www.heart.org Summary  The DASH eating plan is a healthy eating plan that has been shown to reduce high blood pressure (hypertension). It may also reduce your risk for type 2 diabetes, heart disease, and stroke.  With the DASH eating plan, you should limit salt (sodium) intake to 2,300 mg a day. If you have hypertension, you may need to reduce your sodium intake to 1,500 mg a day.  When on the DASH  eating plan, aim to eat more fresh fruits and vegetables, whole grains, lean proteins, low-fat dairy, and heart-healthy fats.  Work with your health care provider or diet and nutrition specialist (dietitian) to adjust your eating plan to your individual calorie needs. This information is not intended to replace advice given to you by your health care provider. Make sure you discuss any questions you have with your health care provider. Document  Released: 05/05/2011 Document Revised: 05/09/2016 Document Reviewed: 05/09/2016 Elsevier Interactive Patient Education  [employer].

## 2018-05-03 ENCOUNTER — Telehealth: Payer: Self-pay

## 2018-05-03 NOTE — Telephone Encounter (Addendum)
Left message on vm for pt to call back.  Need to relay results and instructions, per Dr. Reece AgarG.  Results Your kidneys and potassium remain stable. Potassium was in low end of normal range- make sure to eat diet with potassium rich foods (fruits/vegetables, sweet potatoes etc).

## 2018-05-03 NOTE — Telephone Encounter (Signed)
Noted  

## 2018-05-03 NOTE — Telephone Encounter (Signed)
Patient notified of results.

## 2018-05-14 ENCOUNTER — Other Ambulatory Visit: Payer: Self-pay | Admitting: Orthopedic Surgery

## 2018-05-16 ENCOUNTER — Other Ambulatory Visit: Payer: Self-pay

## 2018-05-16 ENCOUNTER — Encounter (HOSPITAL_COMMUNITY): Payer: Self-pay | Admitting: *Deleted

## 2018-05-16 NOTE — Anesthesia Preprocedure Evaluation (Addendum)
Anesthesia Evaluation  Patient identified by MRN, date of birth, ID band Patient awake    Reviewed: Allergy & Precautions, NPO status , Patient's Chart, lab work & pertinent test results, reviewed documented beta blocker date and time   History of Anesthesia Complications Negative for: history of anesthetic complications  Airway Mallampati: I  TM Distance: >3 FB Neck ROM: Full    Dental  (+) Dental Advisory Given   Pulmonary sleep apnea and Continuous Positive Airway Pressure Ventilation ,    breath sounds clear to auscultation       Cardiovascular hypertension, Pt. on medications and Pt. on home beta blockers (-) angina(-) CAD  Rhythm:Regular Rate:Normal  11/19 Cath: Insignificant coronary artery disease, Normal LVF 11/19 ECHO: EF 50-55%, valves OK   Neuro/Psych Depression negative neurological ROS     GI/Hepatic negative GI ROS, Neg liver ROS,   Endo/Other  Hypothyroidism Morbid obesity  Renal/GU negative Renal ROS     Musculoskeletal   Abdominal (+) + obese,   Peds  Hematology negative hematology ROS (+)   Anesthesia Other Findings   Reproductive/Obstetrics                           Anesthesia Physical Anesthesia Plan  ASA: III  Anesthesia Plan: General   Post-op Pain Management: GA combined w/ Regional for post-op pain   Induction: Intravenous  PONV Risk Score and Plan: 2 and Ondansetron, Dexamethasone and Scopolamine patch - Pre-op  Airway Management Planned: Oral ETT  Additional Equipment:   Intra-op Plan:   Post-operative Plan: Extubation in OR  Informed Consent: I have reviewed the patients History and Physical, chart, labs and discussed the procedure including the risks, benefits and alternatives for the proposed anesthesia with the patient or authorized representative who has indicated his/her understanding and acceptance.   Dental advisory given  Plan Discussed  with: Surgeon and CRNA  Anesthesia Plan Comments: (See PAT note 05/16/2018 by Antionette PolesJames Burns, PA-C Plan routine monitors, GETA with interscalene block for post op analgesia)       Anesthesia Quick Evaluation

## 2018-05-16 NOTE — Progress Notes (Signed)
Anesthesia Chart Review: SAME DAY WORKUP   Case:  [id number] Date/Time:  05/17/18 1348   Procedure:  SHOULDER ARTHROSCOPY WITH SUBACROMIAL DECOMPRESSION (Right )   Anesthesia type:  Choice   Pre-op diagnosis:  RIGHT SHOULDER PARTIAL ROTATOR CUFF TEAR, LABRAL TEAR, BICEP TEAR   [facility]:  [facility] 07 / [facility]   Surgeon:  Jones Broom, MD      DISCUSSION: 52 yo male never smoker. Pertinent hx includes OSA on CPAP, Hypothyroid, HTN, HLD.  Pt recently hospitalized at [facility] 11/18-11/19/19 for Hypertensive emergency with chest pain and mild elevation of troponin. He had cath showing insignificant CAD and normal LVF. Echo showed EF 50-55%, no significant valvular abnormality. Discharged with medical management and aggressive risk factor modification.  He was seen in followup by cardiology, Dr. Darrold Junker, 05/01/2018. Per his OV note in care everywhere: "52 year old gentleman recently hospitalized with chest pain, with insignificant coronary disease by cardiac catheterization, with normal left ventricular function. The patient has essential hypertension, diastolic blood pressure mildly elevated on current BP medications. The patient is awaiting right shoulder surgery. Patient should be at low risk for serious cardiovascular complication during shoulder surgery."  Anticipate he can proceed as planned barring acute status change.  VS: There were no vitals taken for this visit.  PROVIDERS: Hannah Beat, MD is PCP  Marcina Millard, MD is Cardiologist  LABS: Will need DOS labs  IMAGES: CT Angio Chest PE 04/16/2018: IMPRESSION: 1. No demonstrable pulmonary embolus. There is no thoracic aortic aneurysm or dissection.  2. Prominence the main pulmonary outflow tract, a finding felt to be indicative of a degree of pulmonary arterial hypertension.  3.  There is left ventricular hypertrophy.  4. Bibasilar atelectasis. No edema or consolidation. Small nodular opacities on the right,  largest measuring 3 mm. No follow-up needed if patient is low-risk (and has no known or suspected primary neoplasm). Non-contrast chest CT can be considered in 12 months if patient is high-risk. This recommendation follows the consensus statement: Guidelines for Management of Incidental Pulmonary Nodules Detected on CT Images: From the Fleischner Society 2017; Radiology 2017; 284:228-243.  5.  No demonstrable thoracic adenopathy.  6.  Small hiatal hernia.   EKG: 04/13/2018: Sinus rhythm. Rate 82. Consider anterior infarct Borderline repolarization abnormality  CV: TTE 04/17/2018: Left ventricle: The cavity size was normal. Systolic function was   normal. The estimated ejection fraction was in the range of 50%   to 55%.  Cath 04/17/2018:  2nd Mrg lesion is 50% stenosed.  Mid Cx lesion is 30% stenosed.   1.  Insignificant coronary artery disease 2.  Normal left ventricular function  Recommendations  1.  Medical therapy 2.  Aggressive risk factor modification   Past Medical History:  Diagnosis Date  . Ankle fracture yrs ago   left   . Arthritis   . Asthma     hx of childhood asthma, none current  . Biceps muscle tear    rotator cuff and Labrum tear, Right  . Hyperlipidemia   . Hypertension   . Hypothyroidism   . Morbid obesity 08/10/2012  . Morbid obesity (HCC)   . Myocardial infarction (HCC)    per pt   . Sleep apnea    wears CPAP 5.0-15.0  . Syndesmotic ankle sprain yrs ago    Past Surgical History:  Procedure Laterality Date  . ANKLE SURGERY  age 9   left ankle   . BACK SURGERY    . COLONOSCOPY    . HERNIA  REPAIR    . KNEE ARTHROSCOPY  yrs ago   right  . LEFT HEART CATH AND CORONARY ANGIOGRAPHY Left 04/17/2018   Procedure: LEFT HEART CATH AND CORONARY ANGIOGRAPHY;  Surgeon: Marcina MillardParaschos, Alexander, MD;  Location: [facility] INVASIVE CV LAB;  Service: Cardiovascular;  Laterality: Left;  . THUMB ARTHROSCOPY  age 52   left   . VENTRAL HERNIA REPAIR   12/08/2011   Procedure: LAPAROSCOPIC VENTRAL HERNIA;  Surgeon: Atilano InaEric M Wilson, MD,FACS;  Location: [facility] ORS;  Service: General;  Laterality: N/A;  . WISDOM TOOTH EXTRACTION      MEDICATIONS: No current facility-administered medications for this encounter.    . hydrochlorothiazide (HYDRODIURIL) 25 MG tablet  . metoprolol tartrate (LOPRESSOR) 25 MG tablet  . rosuvastatin (CRESTOR) 40 MG tablet  . aspirin EC 81 MG EC tablet  . diclofenac (VOLTAREN) 75 MG EC tablet  . escitalopram (LEXAPRO) 20 MG tablet  . levothyroxine (SYNTHROID, LEVOTHROID) 88 MCG tablet  . tiZANidine (ZANAFLEX) 4 MG tablet     Steve CoveJames Xolani Degracia, PA-C [facility] Phone [phone] 05/16/2018 2:37 PM

## 2018-05-16 NOTE — Progress Notes (Signed)
Pt denies acute cardiopulmonary issues. Pt under the care of Dr. Darrold JunkerParaschos, Cardiology. Pt denies having a stress test. Pt denies recent labs. Pt stated that he was instructed  Stop taking Aspirin and NSAIDs. Pt made aware to stop taking vitamins, fish oil and herbal medications.  Do not take any NSAIDs ie: Ibuprofen, Advil, Naproxen (Aleve), Voltaren, Motrin, BC and Goody Powder. Pt verbalized understanding of all pre-op instructions. PA, Anesthesiology, made aware of surgical clearance.

## 2018-05-17 ENCOUNTER — Other Ambulatory Visit: Payer: Self-pay | Admitting: Family Medicine

## 2018-05-17 ENCOUNTER — Encounter (HOSPITAL_COMMUNITY)
Admission: RE | Disposition: A | Discharge status: Home / Self Care | Payer: Self-pay | Source: Home / Self Care | Attending: Orthopedic Surgery

## 2018-05-17 ENCOUNTER — Encounter (HOSPITAL_COMMUNITY): Payer: Self-pay

## 2018-05-17 ENCOUNTER — Ambulatory Visit (HOSPITAL_COMMUNITY)
Admission: RE | Admit: 2018-05-17 | Discharge: 2018-05-17 | Disposition: A | Discharge status: Home / Self Care | Payer: No Typology Code available for payment source | Attending: Orthopedic Surgery | Admitting: Orthopedic Surgery

## 2018-05-17 ENCOUNTER — Ambulatory Visit (HOSPITAL_COMMUNITY): Payer: No Typology Code available for payment source | Admitting: Physician Assistant

## 2018-05-17 ENCOUNTER — Other Ambulatory Visit: Payer: Self-pay

## 2018-05-17 DIAGNOSIS — M7541 Impingement syndrome of right shoulder: Secondary | ICD-10-CM | POA: Diagnosis not present

## 2018-05-17 DIAGNOSIS — I252 Old myocardial infarction: Secondary | ICD-10-CM | POA: Diagnosis not present

## 2018-05-17 DIAGNOSIS — F329 Major depressive disorder, single episode, unspecified: Secondary | ICD-10-CM | POA: Diagnosis not present

## 2018-05-17 DIAGNOSIS — Z809 Family history of malignant neoplasm, unspecified: Secondary | ICD-10-CM | POA: Diagnosis not present

## 2018-05-17 DIAGNOSIS — K449 Diaphragmatic hernia without obstruction or gangrene: Secondary | ICD-10-CM | POA: Diagnosis not present

## 2018-05-17 DIAGNOSIS — M7551 Bursitis of right shoulder: Secondary | ICD-10-CM | POA: Insufficient documentation

## 2018-05-17 DIAGNOSIS — Z8249 Family history of ischemic heart disease and other diseases of the circulatory system: Secondary | ICD-10-CM | POA: Insufficient documentation

## 2018-05-17 DIAGNOSIS — E785 Hyperlipidemia, unspecified: Secondary | ICD-10-CM | POA: Insufficient documentation

## 2018-05-17 DIAGNOSIS — I10 Essential (primary) hypertension: Secondary | ICD-10-CM | POA: Insufficient documentation

## 2018-05-17 DIAGNOSIS — Z6841 Body Mass Index (BMI) 40.0 and over, adult: Secondary | ICD-10-CM | POA: Diagnosis not present

## 2018-05-17 DIAGNOSIS — M75111 Incomplete rotator cuff tear or rupture of right shoulder, not specified as traumatic: Secondary | ICD-10-CM | POA: Insufficient documentation

## 2018-05-17 DIAGNOSIS — W19XXXA Unspecified fall, initial encounter: Secondary | ICD-10-CM | POA: Diagnosis not present

## 2018-05-17 DIAGNOSIS — Z7982 Long term (current) use of aspirin: Secondary | ICD-10-CM | POA: Insufficient documentation

## 2018-05-17 DIAGNOSIS — M1990 Unspecified osteoarthritis, unspecified site: Secondary | ICD-10-CM | POA: Diagnosis not present

## 2018-05-17 DIAGNOSIS — G4730 Sleep apnea, unspecified: Secondary | ICD-10-CM | POA: Insufficient documentation

## 2018-05-17 DIAGNOSIS — Z79899 Other long term (current) drug therapy: Secondary | ICD-10-CM | POA: Insufficient documentation

## 2018-05-17 DIAGNOSIS — S43491A Other sprain of right shoulder joint, initial encounter: Secondary | ICD-10-CM | POA: Diagnosis not present

## 2018-05-17 DIAGNOSIS — E039 Hypothyroidism, unspecified: Secondary | ICD-10-CM | POA: Diagnosis not present

## 2018-05-17 HISTORY — PX: SHOULDER ARTHROSCOPY WITH SUBACROMIAL DECOMPRESSION: SHX5684

## 2018-05-17 HISTORY — DX: Sleep apnea, unspecified: G47.30

## 2018-05-17 HISTORY — DX: Essential (primary) hypertension: I10

## 2018-05-17 HISTORY — DX: Acute myocardial infarction, unspecified: I21.9

## 2018-05-17 HISTORY — DX: Strain of muscle, fascia and tendon of other parts of biceps, unspecified arm, initial encounter: S46.219A

## 2018-05-17 LAB — BASIC METABOLIC PANEL
Anion gap: 10 (ref 5–15)
BUN: 17 mg/dL (ref 6–20)
CO2: 27 mmol/L (ref 22–32)
Calcium: 9.4 mg/dL (ref 8.9–10.3)
Chloride: 103 mmol/L (ref 98–111)
Creatinine, Ser: 1.16 mg/dL (ref 0.61–1.24)
GFR calc Af Amer: >60 mL/min (ref >60)
GFR calc non Af Amer: >60 mL/min (ref >60)
Glucose, Bld: 91 mg/dL (ref 70–99)
Potassium: 3.8 mmol/L (ref 3.5–5.1)
Sodium: 140 mmol/L (ref 135–145)

## 2018-05-17 LAB — CBC
HCT: 44.7 % (ref 39.0–52.0)
Hemoglobin: 14.2 g/dL (ref 13.0–17.0)
MCH: 27.4 pg (ref 26.0–34.0)
MCHC: 31.8 g/dL (ref 30.0–36.0)
MCV: 86.3 fL (ref 80.0–100.0)
Platelets: 264 10*3/uL (ref 150–400)
RBC: 5.18 MIL/uL (ref 4.22–5.81)
RDW: 13.6 % (ref 11.5–15.5)
WBC: 7.1 10*3/uL (ref 4.0–10.5)
nRBC: 0 % (ref 0.0–0.2)

## 2018-05-17 SURGERY — SHOULDER ARTHROSCOPY WITH SUBACROMIAL DECOMPRESSION
Anesthesia: General | Site: Shoulder | Laterality: Right

## 2018-05-17 MED ORDER — EPHEDRINE 5 MG/ML INJ
INTRAVENOUS | Status: AC
  Filled 2018-05-17: qty 10

## 2018-05-17 MED ORDER — LIDOCAINE 2% (20 MG/ML) 5 ML SYRINGE
INTRAMUSCULAR | Status: AC
  Filled 2018-05-17: qty 10

## 2018-05-17 MED ORDER — LACTATED RINGERS IV SOLN
INTRAVENOUS | Status: DC
  Administered 2018-05-17: 12:00:00 via INTRAVENOUS

## 2018-05-17 MED ORDER — ONDANSETRON HCL 4 MG/2ML IJ SOLN
INTRAMUSCULAR | Status: DC | PRN
  Administered 2018-05-17: 4 mg via INTRAVENOUS

## 2018-05-17 MED ORDER — LACTATED RINGERS IV SOLN
INTRAVENOUS | Status: DC | PRN
  Administered 2018-05-17: 14:00:00 via INTRAVENOUS

## 2018-05-17 MED ORDER — CEFAZOLIN SODIUM-DEXTROSE 2-4 GM/100ML-% IV SOLN
INTRAVENOUS | Status: AC
  Filled 2018-05-17: qty 100

## 2018-05-17 MED ORDER — PHENYLEPHRINE 40 MCG/ML (10ML) SYRINGE FOR IV PUSH (FOR BLOOD PRESSURE SUPPORT)
PREFILLED_SYRINGE | INTRAVENOUS | Status: AC
  Filled 2018-05-17: qty 10

## 2018-05-17 MED ORDER — MIDAZOLAM HCL 2 MG/2ML IJ SOLN
2.0000 mg | Freq: Once | INTRAMUSCULAR | Status: AC
  Administered 2018-05-17: 2 mg via INTRAVENOUS

## 2018-05-17 MED ORDER — PROPOFOL 10 MG/ML IV BOLUS
INTRAVENOUS | Status: AC
  Filled 2018-05-17: qty 20

## 2018-05-17 MED ORDER — BUPIVACAINE LIPOSOME 1.3 % IJ SUSP
INTRAMUSCULAR | Status: DC | PRN
  Administered 2018-05-17: 10 mL via PERINEURAL

## 2018-05-17 MED ORDER — FENTANYL CITRATE (PF) 100 MCG/2ML IJ SOLN
INTRAMUSCULAR | Status: AC
  Administered 2018-05-17: 100 ug via INTRAVENOUS
  Filled 2018-05-17: qty 2

## 2018-05-17 MED ORDER — SUGAMMADEX SODIUM 200 MG/2ML IV SOLN
INTRAVENOUS | Status: DC | PRN
  Administered 2018-05-17: 325 mg via INTRAVENOUS

## 2018-05-17 MED ORDER — LIDOCAINE 2% (20 MG/ML) 5 ML SYRINGE
INTRAMUSCULAR | Status: AC
  Filled 2018-05-17: qty 5

## 2018-05-17 MED ORDER — FENTANYL CITRATE (PF) 250 MCG/5ML IJ SOLN
INTRAMUSCULAR | Status: AC
  Filled 2018-05-17: qty 5

## 2018-05-17 MED ORDER — SCOPOLAMINE 1 MG/3DAYS TD PT72
1.0000 | MEDICATED_PATCH | Freq: Once | TRANSDERMAL | Status: DC
  Administered 2018-05-17: 1.5 mg via TRANSDERMAL
  Filled 2018-05-17: qty 1

## 2018-05-17 MED ORDER — SUCCINYLCHOLINE CHLORIDE 200 MG/10ML IV SOSY
PREFILLED_SYRINGE | INTRAVENOUS | Status: AC
  Filled 2018-05-17: qty 10

## 2018-05-17 MED ORDER — OXYCODONE-ACETAMINOPHEN 5-325 MG PO TABS: 1.0000 | ORAL_TABLET | ORAL | 0 refills | Status: AC | PRN

## 2018-05-17 MED ORDER — FENTANYL CITRATE (PF) 250 MCG/5ML IJ SOLN
INTRAMUSCULAR | Status: DC | PRN
  Administered 2018-05-17: 50 ug via INTRAVENOUS
  Administered 2018-05-17: 100 ug via INTRAVENOUS
  Administered 2018-05-17: 50 ug via INTRAVENOUS

## 2018-05-17 MED ORDER — LIDOCAINE 2% (20 MG/ML) 5 ML SYRINGE
INTRAMUSCULAR | Status: DC | PRN
  Administered 2018-05-17: 40 mg via INTRAVENOUS

## 2018-05-17 MED ORDER — DEXAMETHASONE SODIUM PHOSPHATE 10 MG/ML IJ SOLN
INTRAMUSCULAR | Status: DC | PRN
  Administered 2018-05-17: 10 mg via INTRAVENOUS

## 2018-05-17 MED ORDER — CHLORHEXIDINE GLUCONATE 4 % EX LIQD: 60.0000 mL | Freq: Once | CUTANEOUS | Status: DC

## 2018-05-17 MED ORDER — DEXAMETHASONE SODIUM PHOSPHATE 10 MG/ML IJ SOLN
INTRAMUSCULAR | Status: AC
  Filled 2018-05-17: qty 3

## 2018-05-17 MED ORDER — PHENYLEPHRINE 40 MCG/ML (10ML) SYRINGE FOR IV PUSH (FOR BLOOD PRESSURE SUPPORT)
PREFILLED_SYRINGE | INTRAVENOUS | Status: AC
  Filled 2018-05-17: qty 20

## 2018-05-17 MED ORDER — MIDAZOLAM HCL 2 MG/2ML IJ SOLN
INTRAMUSCULAR | Status: AC
  Administered 2018-05-17: 2 mg via INTRAVENOUS
  Filled 2018-05-17: qty 2

## 2018-05-17 MED ORDER — ONDANSETRON HCL 4 MG/2ML IJ SOLN
INTRAMUSCULAR | Status: AC
  Filled 2018-05-17: qty 6

## 2018-05-17 MED ORDER — ROCURONIUM BROMIDE 50 MG/5ML IV SOSY
PREFILLED_SYRINGE | INTRAVENOUS | Status: AC
  Filled 2018-05-17: qty 10

## 2018-05-17 MED ORDER — MEPERIDINE HCL 50 MG/ML IJ SOLN: 6.2500 mg | INTRAMUSCULAR | Status: DC | PRN

## 2018-05-17 MED ORDER — PROPOFOL 10 MG/ML IV BOLUS
INTRAVENOUS | Status: DC | PRN
  Administered 2018-05-17: 200 mg via INTRAVENOUS

## 2018-05-17 MED ORDER — SUCCINYLCHOLINE CHLORIDE 200 MG/10ML IV SOSY
PREFILLED_SYRINGE | INTRAVENOUS | Status: DC | PRN
  Administered 2018-05-17: 160 mg via INTRAVENOUS

## 2018-05-17 MED ORDER — ROCURONIUM BROMIDE 10 MG/ML (PF) SYRINGE
PREFILLED_SYRINGE | INTRAVENOUS | Status: DC | PRN
  Administered 2018-05-17: 30 mg via INTRAVENOUS

## 2018-05-17 MED ORDER — ROCURONIUM BROMIDE 50 MG/5ML IV SOSY
PREFILLED_SYRINGE | INTRAVENOUS | Status: AC
  Filled 2018-05-17: qty 5

## 2018-05-17 MED ORDER — HYDROMORPHONE HCL 1 MG/ML IJ SOLN: 0.2500 mg | INTRAMUSCULAR | Status: DC | PRN

## 2018-05-17 MED ORDER — SODIUM CHLORIDE 0.9 % IV SOLN
INTRAVENOUS | Status: DC | PRN
  Administered 2018-05-17: 30 ug/min via INTRAVENOUS

## 2018-05-17 MED ORDER — PROMETHAZINE HCL 25 MG/ML IJ SOLN: 6.2500 mg | INTRAMUSCULAR | Status: DC | PRN

## 2018-05-17 MED ORDER — BUPIVACAINE HCL (PF) 0.5 % IJ SOLN
INTRAMUSCULAR | Status: DC | PRN
  Administered 2018-05-17: 15 mL via PERINEURAL

## 2018-05-17 MED ORDER — MIDAZOLAM HCL 2 MG/2ML IJ SOLN: 0.5000 mg | Freq: Once | INTRAMUSCULAR | Status: DC | PRN

## 2018-05-17 MED ORDER — CEFAZOLIN SODIUM-DEXTROSE 2-4 GM/100ML-% IV SOLN
2.0000 g | INTRAVENOUS | Status: AC
  Administered 2018-05-17: 2 g via INTRAVENOUS

## 2018-05-17 MED ORDER — METHOCARBAMOL 500 MG PO TABS: 500.0000 mg | ORAL_TABLET | Freq: Three times a day (TID) | ORAL | 0 refills | Status: DC

## 2018-05-17 MED ORDER — PHENYLEPHRINE 40 MCG/ML (10ML) SYRINGE FOR IV PUSH (FOR BLOOD PRESSURE SUPPORT)
PREFILLED_SYRINGE | INTRAVENOUS | Status: DC | PRN
  Administered 2018-05-17: 80 ug via INTRAVENOUS

## 2018-05-17 MED ORDER — FENTANYL CITRATE (PF) 100 MCG/2ML IJ SOLN
100.0000 ug | Freq: Once | INTRAMUSCULAR | Status: AC
  Administered 2018-05-17: 100 ug via INTRAVENOUS

## 2018-05-17 MED ORDER — SODIUM CHLORIDE 0.9 % IR SOLN
Status: DC | PRN
  Administered 2018-05-17: 6000 mL

## 2018-05-17 SURGICAL SUPPLY — 60 items
BLADE SURG 11 STRL SS (BLADE) ×2 IMPLANT
BUR OVAL 4.0 (BURR) IMPLANT
BURR OVAL 8 FLU 4.0X13 (MISCELLANEOUS) ×2 IMPLANT
CANNULA 5.75X71 LONG (CANNULA) ×2 IMPLANT
CANNULA TWIST IN 8.25X7CM (CANNULA) IMPLANT
CHLORAPREP W/TINT 26ML (MISCELLANEOUS) ×2 IMPLANT
CUTTER BONE 4.0MM X 13CM (MISCELLANEOUS) ×2 IMPLANT
DRAPE INCISE IOBAN 66X45 STRL (DRAPES) IMPLANT
DRAPE ORTHO SPLIT 77X108 STRL (DRAPES) ×2
DRAPE STERI 35X30 U-POUCH (DRAPES) ×2 IMPLANT
DRAPE SURG 17X23 STRL (DRAPES) ×2 IMPLANT
DRAPE SURG ORHT 6 SPLT 77X108 (DRAPES) ×2 IMPLANT
DRAPE U-SHAPE 47X51 STRL (DRAPES) ×2 IMPLANT
DRAPE U-SHAPE 76X120 STRL (DRAPES) ×2 IMPLANT
DRSG PAD ABDOMINAL 8X10 ST (GAUZE/BANDAGES/DRESSINGS) ×2 IMPLANT
GAUZE SPONGE 4X4 12PLY STRL (GAUZE/BANDAGES/DRESSINGS) ×2 IMPLANT
GAUZE XEROFORM 1X8 LF (GAUZE/BANDAGES/DRESSINGS) ×2 IMPLANT
GLOVE BIO SURGEON STRL SZ7 (GLOVE) ×4 IMPLANT
GLOVE BIO SURGEON STRL SZ7.5 (GLOVE) ×2 IMPLANT
GLOVE BIOGEL PI IND STRL 7.0 (GLOVE) ×2 IMPLANT
GLOVE BIOGEL PI IND STRL 8 (GLOVE) ×1 IMPLANT
GLOVE BIOGEL PI INDICATOR 7.0 (GLOVE) ×2
GLOVE BIOGEL PI INDICATOR 8 (GLOVE) ×1
GOWN STRL REUS W/ TWL LRG LVL3 (GOWN DISPOSABLE) ×2 IMPLANT
GOWN STRL REUS W/ TWL XL LVL3 (GOWN DISPOSABLE) ×1 IMPLANT
GOWN STRL REUS W/TWL LRG LVL3 (GOWN DISPOSABLE) ×2
GOWN STRL REUS W/TWL XL LVL3 (GOWN DISPOSABLE) ×1
KIT BASIN OR (CUSTOM PROCEDURE TRAY) ×2 IMPLANT
KIT BEACH CHAIR TRIMANO (MISCELLANEOUS) ×2 IMPLANT
KIT TURNOVER KIT B (KITS) ×2 IMPLANT
MANIFOLD NEPTUNE II (INSTRUMENTS) ×2 IMPLANT
NDL SUT 6 .5 CRC .975X.05 MAYO (NEEDLE) IMPLANT
NEEDLE HYPO 25GX1X1/2 BEV (NEEDLE) IMPLANT
NEEDLE MAYO TAPER (NEEDLE)
NEEDLE SCORPION MULTI FIRE (NEEDLE) IMPLANT
NEEDLE SPNL 18GX3.5 QUINCKE PK (NEEDLE) ×2 IMPLANT
PACK SHOULDER (CUSTOM PROCEDURE TRAY) ×2 IMPLANT
PAD ARMBOARD 7.5X6 YLW CONV (MISCELLANEOUS) ×4 IMPLANT
RESECTOR FULL RADIUS 4.2MM (BLADE) IMPLANT
SLING ARM FOAM STRAP LRG (SOFTGOODS) IMPLANT
SLING ARM FOAM STRAP MED (SOFTGOODS) IMPLANT
SLING ARM FOAM STRAP XLG (SOFTGOODS) ×2 IMPLANT
SPONGE LAP 4X18 RFD (DISPOSABLE) IMPLANT
SUPPORT WRAP ARM LG (MISCELLANEOUS) IMPLANT
SUT 2 FIBERLOOP 20 STRT BLUE (SUTURE)
SUT ETHILON 3 0 PS 1 (SUTURE) ×2 IMPLANT
SUT TIGER TAPE 7 IN WHITE (SUTURE) IMPLANT
SUTURE 2 FIBERLOOP 20 STRT BLU (SUTURE) IMPLANT
SUTURE TAPE 1.3 40 TPR END (SUTURE) IMPLANT
SUTURETAPE 1.3 40 TPR END (SUTURE)
SYR CONTROL 10ML LL (SYRINGE) IMPLANT
TAPE CLOTH SURG 6X10 WHT LF (GAUZE/BANDAGES/DRESSINGS) ×2 IMPLANT
TAPE FIBER 2MM 7IN #2 BLUE (SUTURE) IMPLANT
TOWEL OR 17X24 6PK STRL BLUE (TOWEL DISPOSABLE) IMPLANT
TOWEL OR 17X26 10 PK STRL BLUE (TOWEL DISPOSABLE) ×2 IMPLANT
TOWEL OR NON WOVEN STRL DISP B (DISPOSABLE) IMPLANT
TUBE CONNECTING 12X1/4 (SUCTIONS) ×2 IMPLANT
TUBING ARTHROSCOPY IRRIG 16FT (MISCELLANEOUS) ×2 IMPLANT
WAND HAND CNTRL MULTIVAC 90 (MISCELLANEOUS) IMPLANT
WATER STERILE IRR 1000ML POUR (IV SOLUTION) ×2 IMPLANT

## 2018-05-17 NOTE — Discharge Instructions (Signed)

## 2018-05-17 NOTE — Anesthesia Procedure Notes (Signed)
Procedure Name: Intubation Date/Time: 05/17/2018 2:21 PM Performed by: Jearld Pies, CRNA Pre-anesthesia Checklist: Patient identified, Emergency Drugs available, Suction available and Patient being monitored Patient Re-evaluated:Patient Re-evaluated prior to induction Oxygen Delivery Method: Circle System Utilized Preoxygenation: Pre-oxygenation with 100% oxygen Induction Type: IV induction and Rapid sequence Laryngoscope Size: Mac and 4 Grade View: Grade I Tube type: Oral Tube size: 7.5 mm Number of attempts: 1 Airway Equipment and Method: Stylet and Oral airway Placement Confirmation: ETT inserted through vocal cords under direct vision,  positive ETCO2 and breath sounds checked- equal and bilateral Secured at: 23 cm Tube secured with: Tape Dental Injury: Teeth and Oropharynx as per pre-operative assessment

## 2018-05-17 NOTE — Anesthesia Procedure Notes (Addendum)
Anesthesia Regional Block: Interscalene brachial plexus block   Pre-Anesthetic Checklist: ,, timeout performed, Correct Patient, Correct Site, Correct Laterality, Correct Procedure, Correct Position, site marked, Risks and benefits discussed,  Surgical consent,  Pre-op evaluation,  At surgeon's request and post-op pain management  Laterality: Right  Prep: chloraprep       Needles:  Injection technique: Single-shot  Needle Type: Echogenic Stimulator Needle     Needle Length: 9cm  Needle Gauge: 21     Additional Needles:   Procedures:, nerve stimulator,,, ultrasound used (permanent image in chart),,,,   Nerve Stimulator or Paresthesia:  Response: hand twitch, 0.4 mA, 0.1 ms,   Additional Responses:   Narrative:  Start time: 05/17/2018 1:35 PM End time: 05/17/2018 1:43 PM Injection made incrementally with aspirations every 5 mL.  Performed by: Personally  Anesthesiologist: Jairo BenJackson, Carlyle Achenbach, MD  Additional Notes: Pt identified in Holding room.  Monitors applied. Working IV access confirmed. Sterile prep, drape R neck and clavicle.  #21ga ECHOgenic PNS to hand twitch at 0.5240mA threshold with US guidance.  15cc 0.5% Bupivacaine with 10cc Exparel injected incrementally after negative test dose.  Patient asymptomatic, VSS, no heme aspirated, tolerated well.  [name], MD       [place] guided R interscalene block

## 2018-05-17 NOTE — Transfer of Care (Signed)
Immediate Anesthesia Transfer of Care Note  Patient: Steve Ashley  Procedure(s) Performed: SHOULDER ARTHROSCOPY WITH SUBACROMIAL DECOMPRESSION (Right Shoulder)  Patient Location: PACU  Anesthesia Type:GA combined with regional for post-op pain  Level of Consciousness: drowsy  Airway & Oxygen Therapy: Patient Spontanous Breathing and Patient connected to face mask oxygen  Post-op Assessment: Report given to RN and Post -op Vital signs reviewed and stable  Post vital signs: Reviewed and stable  Last Vitals:  Vitals Value Taken Time  BP 165/107 05/17/2018  3:28 PM  Temp 36.6 C 05/17/2018  3:28 PM  Pulse 84 05/17/2018  3:30 PM  Resp 25 05/17/2018  3:30 PM  SpO2 99 % 05/17/2018  3:30 PM  Vitals shown include unvalidated device data.  Last Pain:  Vitals:   05/17/18 1207  TempSrc:   PainSc: 0-No pain      Patients Stated Pain Goal: 3 (05/17/18 1207)  Complications: No apparent anesthesia complications

## 2018-05-17 NOTE — Op Note (Signed)
Procedure(s):  Procedure Note  Steve Ashley male 52 y.o. 05/17/2018  Preoperative diagnosis: #1 right shoulder labral tear, proximal long head biceps tear #2 right shoulder impingement with unfavorable acromial anatomy.  Postoperative diagnosis: same  Procedure(s) and Anesthesia Type: #1 right SHOULDER arthroscopic debridement SLAP tear, anterior labral tear, glenohumeral arthropathy and subacromial bursitis #2 right shoulder arthroscopic subacromial decompression #3 right shoulder proximal long head biceps tenotomy  Surgeon(s) and Role:    Jones Broom* Denee Boeder, MD - Primary     Surgeon: Berline LopesJustin W Jona Ashley   Assistants: Damita Lackanielle Lalibert PA-C (Danielle was present and scrubbed throughout the procedure and was essential in positioning, assisting with the camera and instrumentation,, and closure)  Anesthesia: General endotracheal anesthesia with preoperative interscalene block given by the attending anesthesiologist      Procedure Detail  Estimated Blood Loss: Min         Drains: none  Blood Given: none         Specimens: none        Complications:  * No complications entered in OR log *         Disposition: PACU - hemodynamically stable.         Condition: stable    Procedure:   INDICATIONS FOR SURGERY: The patient is 52 y.o. male who was injured at work after a fall in the bleachers.  He injured the right shoulder and elbow.  The elbow got better with conservative treatment but the shoulder continued to bother him despite extensive conservative management.  MRI showed extensive tearing the proximal long head biceps and labrum.  Ultimately indicated for surgical treatment to try and decrease pain and restore function.  OPERATIVE FINDINGS: Examination under anesthesia: No stiffness or instability.   DESCRIPTION OF PROCEDURE: The patient was identified in preoperative  holding area where I personally marked the operative site after  verifying site, side, and  procedure with the patient. An interscalene block was given by the attending anesthesiologist the holding area.  The patient was taken back to the operating room where general anesthesia was induced without complication and was placed in the beach-chair position with the back  elevated about 60 degrees and all extremities and head and neck carefully padded and  positioned.   The right upper extremity was then prepped and  draped in a standard sterile fashion. The appropriate time-out  procedure was carried out. The patient did receive IV antibiotics  within 30 minutes of incision.   A small posterior portal incision was made and the arthroscope was introduced into the joint. An anterior portal was then established above the subscapularis using needle localization. Small cannula was placed anteriorly. Diagnostic arthroscopy was then carried out.  The subscapularis was noted to be completely intact.  Glenohumeral joint surfaces were noted to have moderate arthropathy with some grade 3 changes on the anterior inferior glenoid and posterior central humeral head.  No loose bodies noted.  He was noted to have a complete tear of the superior labrum involving the biceps anchor with complete detachment and subluxation into the joint.  The biceps tendon was pulled into the joint was also noted to have severe proximal tearing involving about 50% of the thickness of the tendon.  This was felt to be a pain generator.  Therefore through a small anterior incision curved Mayo scissors were used to release the biceps from the superior labrum and the biceps was allowed to retract from the joint.  The shaver and ArthroCare were then used  to extensively debride the superior labrum back to a stable base. The undersurface of the supraspinatus was examined and noted to have some mild fraying but no partial tearing.  This was debrided.  The arthroscope was then introduced into the subacromial space a standard lateral portal  was established with needle localization. The shaver was used through the lateral portal to perform extensive bursectomy. Coracoacromial ligament was examined and found to be frayed indicating impingement.  The bursal surface of the rotator cuff was carefully examined and probed and noted to be intact.  The coracoacromial ligament was taken down off the anterior acromion with the ArthroCare exposing a moderate downsloping anterior acromial spur. A high-speed bur was then used through the lateral portal to take down the anterior acromial spur from lateral to medial in a standard acromioplasty.  The acromioplasty was also viewed from the lateral portal and the bur was used as necessary to ensure that the acromion was completely flat from posterior to anterior.  The arthroscopic equipment was removed from the joint and the portals were closed with 3-0 nylon in an interrupted fashion. Sterile dressings were then applied including Xeroform 4 x 4's ABDs and tape. The patient was then allowed to awaken from general anesthesia, placed in a sling, transferred to the stretcher and taken to the recovery room in stable condition.   POSTOPERATIVE PLAN: The patient will be discharged home today and will followup in one week for suture removal and wound check.  We will get him back into physical therapy right away.

## 2018-05-17 NOTE — Anesthesia Postprocedure Evaluation (Signed)
Anesthesia Post Note  Patient: Vic B Spina  Procedure(s) Performed: SHOULDER ARTHROSCOPY WITH SUBACROMIAL DECOMPRESSION (Right Shoulder)     Patient location during evaluation: PACU Anesthesia Type: General and Regional Level of consciousness: awake and alert, patient cooperative and oriented Pain management: pain level controlled Vital Signs Assessment: post-procedure vital signs reviewed and stable Respiratory status: spontaneous breathing, nonlabored ventilation and respiratory function stable Cardiovascular status: blood pressure returned to baseline and stable Postop Assessment: no apparent nausea or vomiting and adequate PO intake Anesthetic complications: no    Last Vitals:  Vitals:   05/17/18 1545 05/17/18 1600  BP: (!) 157/95 (!) 152/90  Pulse: 81 77  Resp: 16 19  Temp:    SpO2: 95% 95%    Last Pain:  Vitals:   05/17/18 1600  TempSrc:   PainSc: 0-No pain                 Dennys Guin,E. Nadeen Shipman

## 2018-05-17 NOTE — H&P (Signed)
Steve Ashley is an 52 y.o. male.   Chief Complaint: Right shoulder pain and dysfunction HPI: Right shoulder pain and dysfunction after an injury at work which is been refractory to nonoperative management.  MRI shows partial tearing of the supraspinatus and subscapularis with anterior labral tear and significant pathology of the long head biceps tendon.  Indicated for surgical treatment to try and decrease pain and restore function.  Past Medical History:  Diagnosis Date  . Ankle fracture yrs ago   left   . Arthritis   . Asthma     hx of childhood asthma, none current  . Biceps muscle tear    rotator cuff and Labrum tear, Right  . Hyperlipidemia   . Hypertension   . Hypothyroidism   . Morbid obesity 08/10/2012  . Morbid obesity (HCC)   . Myocardial infarction (HCC)    per pt   . Sleep apnea    wears CPAP 5.0-15.0  . Syndesmotic ankle sprain yrs ago    Past Surgical History:  Procedure Laterality Date  . ANKLE SURGERY  age 52   left ankle   . BACK SURGERY    . COLONOSCOPY    . HERNIA REPAIR    . KNEE ARTHROSCOPY  yrs ago   right  . LEFT HEART CATH AND CORONARY ANGIOGRAPHY Left 04/17/2018   Procedure: LEFT HEART CATH AND CORONARY ANGIOGRAPHY;  Surgeon: Marcina MillardParaschos, Alexander, MD;  Location: [facility];  Service: Cardiovascular;  Laterality: Left;  . THUMB ARTHROSCOPY  age 52   left   . VENTRAL HERNIA REPAIR  12/08/2011   Procedure: LAPAROSCOPIC VENTRAL HERNIA;  Surgeon: Atilano InaEric M Wilson, MD,FACS;  Location: [facility];  Service: General;  Laterality: N/A;  . WISDOM TOOTH EXTRACTION      Family History  Problem Relation Age of Onset  . Hypertension Mother   . Cancer Father        prostate  . Heart disease Neg Hx   . Colon cancer Neg Hx   . Diabetes Neg Hx    Social History:  reports that he has never smoked. He has never used smokeless tobacco. He reports current alcohol use. He reports that he does not use drugs.  Allergies:  Allergies  Allergen Reactions  .  Latex Itching    Medications Prior to Admission  Medication Sig Dispense Refill  . aspirin EC 81 MG EC tablet Take 1 tablet (81 mg total) by mouth daily. (Patient taking differently: Take 81 mg by mouth every evening. ) 30 tablet 0  . hydrochlorothiazide (HYDRODIURIL) 25 MG tablet Take 1 tablet (25 mg total) by mouth daily. (Patient taking differently: Take 25 mg by mouth every evening. ) 30 tablet 0  . levothyroxine (SYNTHROID, LEVOTHROID) 88 MCG tablet TAKE 1 TABLET BY MOUTH EVERY DAY (Patient taking differently: Take 88 mcg by mouth daily before breakfast. ) 90 tablet 3  . metoprolol tartrate (LOPRESSOR) 25 MG tablet Take 1 tablet (25 mg total) by mouth 2 (two) times daily. 60 tablet 0  . rosuvastatin (CRESTOR) 40 MG tablet Take 1 tablet (40 mg total) by mouth daily at 6 PM. 30 tablet 0  . diclofenac (VOLTAREN) 75 MG EC tablet Take 75 mg by mouth every evening.     . escitalopram (LEXAPRO) 20 MG tablet TAKE 1 TABLET BY MOUTH EVERY DAY (Patient taking differently: Take 20 mg by mouth every evening. ) 90 tablet 1  . tiZANidine (ZANAFLEX) 4 MG tablet TAKE 1 TABLET BY MOUTH EVERY DAY  IN THE EVENING 30 tablet 1    Results for orders placed or performed during the hospital encounter of 05/17/18 (from the past 48 hour(s))  CBC     Status: None   Collection Time: 05/17/18 12:20 PM  Result Value Ref Range   WBC 7.1 4.0 - 10.5 K/uL   RBC 5.18 4.22 - 5.81 MIL/uL   Hemoglobin 14.2 13.0 - 17.0 g/dL   HCT 40.944.7 81.139.0 - 91.452.0 %   MCV 86.3 80.0 - 100.0 fL   MCH 27.4 26.0 - 34.0 pg   MCHC 31.8 30.0 - 36.0 g/dL   RDW 78.213.6 95.611.5 - 21.315.5 %   Platelets 264 150 - 400 K/uL   nRBC 0.0 0.0 - 0.2 %    Comment: Performed at [facility], [address] [place], [place] [address]  Basic metabolic panel     Status: None   Collection Time: 05/17/18 12:20 PM  Result Value Ref Range   Sodium 140 135 - 145 mmol/L   Potassium 3.8 3.5 - 5.1 mmol/L   Chloride 103 98 - 111 mmol/L   CO2 27 22 - 32 mmol/L    Glucose, Bld 91 70 - 99 mg/dL   BUN 17 6 - 20 mg/dL   Creatinine, Ser 8.461.16 0.61 - 1.24 mg/dL   Calcium 9.4 8.9 - 96.210.3 mg/dL   GFR calc non Af Amer >60 >60 mL/min   GFR calc Af Amer >60 >60 mL/min   Anion gap 10 5 - 15    Comment: Performed at [facility], [address] [place], [place] [address]   No results found.  Review of Systems  All other systems reviewed and are negative.   Blood pressure 129/82, pulse 70, temperature 98.8 F (37.1 C), temperature source Oral, resp. rate 20, height 6' (1.829 m), weight (!) 156 kg, SpO2 98 %. Physical Exam  Constitutional: He is oriented to person, place, and time. He appears well-developed and well-nourished.  HENT:  Head: Atraumatic.  Eyes: EOM are normal.  Cardiovascular: Intact distal pulses.  Respiratory: Effort normal.  Musculoskeletal:     Comments: Right shoulder pain with rotator cuff impingement testing.  Distally neurovascularly intact.  Positive O'Brien's test.  Neurological: He is alert and oriented to person, place, and time.  Skin: Skin is warm and dry.  Psychiatric: He has a normal mood and affect.     Assessment/Plan Right shoulder pain and dysfunction after an injury at work which is been refractory to nonoperative management.  MRI shows partial tearing of the supraspinatus and subscapularis with anterior labral tear and significant pathology of the long head biceps tendon.  Indicated for surgical treatment to try and decrease pain and restore function.  Plan right shoulder arthroscopy Risks / benefits of surgery discussed Consent on chart  NPO for OR Preop antibiotics   Berline LopesJustin W Markitta Ausburn, MD 05/17/2018, 1:39 PM

## 2018-05-17 NOTE — Telephone Encounter (Signed)
Last office visit 04/25/2018 with Dr. Reece AgarG for hospital follow up.  Last refilled 04/20/18 for #30 with 1 refill.  No future appointments.  Ok to refill?

## 2018-05-18 ENCOUNTER — Encounter (HOSPITAL_COMMUNITY): Payer: Self-pay | Admitting: Orthopedic Surgery

## 2018-06-08 ENCOUNTER — Other Ambulatory Visit: Payer: Self-pay | Admitting: Family Medicine

## 2018-06-08 NOTE — Telephone Encounter (Signed)
Last office visit 04/25/2018 with Dr. Reece Agar for hospital follow up.  Last refilled 05/17/2018 for #30 with 1 refill.  No future appointments.

## 2018-07-05 ENCOUNTER — Other Ambulatory Visit: Payer: Self-pay | Admitting: Family Medicine

## 2018-07-05 NOTE — Telephone Encounter (Signed)
Last office visit 04/25/2018 with Dr. Reece Agar for hospital follow up.  Last refilled 06/08/2018 for #30 with 1 refill.  No future appointments.

## 2018-09-07 ENCOUNTER — Other Ambulatory Visit: Payer: Self-pay | Admitting: Family Medicine

## 2018-10-07 ENCOUNTER — Other Ambulatory Visit: Payer: Self-pay | Admitting: Family Medicine

## 2018-10-08 NOTE — Telephone Encounter (Signed)
Last office visit 04/25/2018 with Dr. Reece Agar for hospital follow up.  Last refilled 07/05/2018 for #30 with 2 refills. No future appointments.

## 2018-12-30 ENCOUNTER — Other Ambulatory Visit: Payer: Self-pay | Admitting: Family Medicine

## 2018-12-31 NOTE — Telephone Encounter (Signed)
Last office visit 04/25/2018 with Dr. Darnell Level for hospital follow up.  Last refilled 10/08/2018 for #30 with 2 refills.  No future refills.

## 2019-03-08 ENCOUNTER — Telehealth: Payer: Self-pay | Admitting: Family Medicine

## 2019-03-08 NOTE — Telephone Encounter (Signed)
Please schedule CPE with fasting labs prior with Dr. [name].  

## 2019-03-08 NOTE — Telephone Encounter (Signed)
Labs 11/5 °cpx 11/11 °Pt aware °

## 2019-03-28 ENCOUNTER — Other Ambulatory Visit: Payer: Self-pay | Admitting: Family Medicine

## 2019-03-28 DIAGNOSIS — Z0000 Encounter for general adult medical examination without abnormal findings: Secondary | ICD-10-CM

## 2019-03-28 DIAGNOSIS — E039 Hypothyroidism, unspecified: Secondary | ICD-10-CM

## 2019-03-28 DIAGNOSIS — Z131 Encounter for screening for diabetes mellitus: Secondary | ICD-10-CM

## 2019-04-04 ENCOUNTER — Other Ambulatory Visit (INDEPENDENT_AMBULATORY_CARE_PROVIDER_SITE_OTHER): Payer: BC Managed Care – PPO

## 2019-04-04 DIAGNOSIS — Z0000 Encounter for general adult medical examination without abnormal findings: Secondary | ICD-10-CM | POA: Diagnosis not present

## 2019-04-04 DIAGNOSIS — Z131 Encounter for screening for diabetes mellitus: Secondary | ICD-10-CM

## 2019-04-04 DIAGNOSIS — E039 Hypothyroidism, unspecified: Secondary | ICD-10-CM

## 2019-04-05 LAB — PSA, TOTAL WITH REFLEX TO PSA, FREE: PSA, Total: 0.4 ng/mL (ref <=4.0)

## 2019-04-05 LAB — HEMOGLOBIN A1C: Hgb A1c MFr Bld: 6.2 % (ref 4.6–6.5)

## 2019-04-05 LAB — HEPATIC FUNCTION PANEL
ALT: 28 U/L (ref 0–53)
AST: 23 U/L (ref 0–37)
Albumin: 4.7 g/dL (ref 3.5–5.2)
Alkaline Phosphatase: 65 U/L (ref 39–117)
Bilirubin, Direct: 0.2 mg/dL (ref 0.0–0.3)
Total Bilirubin: 0.8 mg/dL (ref 0.2–1.2)
Total Protein: 7.8 g/dL (ref 6.0–8.3)

## 2019-04-05 LAB — CBC WITH DIFFERENTIAL/PLATELET
Basophils Absolute: 0.1 10*3/uL (ref 0.0–0.1)
Basophils Relative: 1.1 % (ref 0.0–3.0)
Eosinophils Absolute: 0.3 10*3/uL (ref 0.0–0.7)
Eosinophils Relative: 4.4 % (ref 0.0–5.0)
HCT: 44.2 % (ref 39.0–52.0)
Hemoglobin: 14.3 g/dL (ref 13.0–17.0)
Lymphocytes Relative: 36.7 % (ref 12.0–46.0)
Lymphs Abs: 2.6 10*3/uL (ref 0.7–4.0)
MCHC: 32.3 g/dL (ref 30.0–36.0)
MCV: 88.6 fl (ref 78.0–100.0)
Monocytes Absolute: 0.8 10*3/uL (ref 0.1–1.0)
Monocytes Relative: 10.7 % (ref 3.0–12.0)
Neutro Abs: 3.3 10*3/uL (ref 1.4–7.7)
Neutrophils Relative %: 47.1 % (ref 43.0–77.0)
Platelets: 229 10*3/uL (ref 150.0–400.0)
RBC: 4.99 Mil/uL (ref 4.22–5.81)
RDW: 15.2 % (ref 11.5–15.5)
WBC: 7 10*3/uL (ref 4.0–10.5)

## 2019-04-05 LAB — BASIC METABOLIC PANEL
BUN: 18 mg/dL (ref 6–23)
CO2: 33 mEq/L — ABNORMAL HIGH (ref 19–32)
Calcium: 9.6 mg/dL (ref 8.4–10.5)
Chloride: 100 mEq/L (ref 96–112)
Creatinine, Ser: 1.05 mg/dL (ref 0.40–1.50)
GFR: 88.89 mL/min (ref >60.00)
Glucose, Bld: 92 mg/dL (ref 70–99)
Potassium: 4.2 mEq/L (ref 3.5–5.1)
Sodium: 140 mEq/L (ref 135–145)

## 2019-04-05 LAB — T4, FREE: Free T4: 0.67 ng/dL (ref 0.60–1.60)

## 2019-04-05 LAB — LIPID PANEL
Cholesterol: 152 mg/dL (ref 0–200)
HDL: 40.6 mg/dL (ref >39.00)
LDL Cholesterol: 93 mg/dL (ref 0–99)
NonHDL: 111.25
Total CHOL/HDL Ratio: 4
Triglycerides: 89 mg/dL (ref 0.0–149.0)
VLDL: 17.8 mg/dL (ref 0.0–40.0)

## 2019-04-05 LAB — TSH: TSH: 11.37 u[IU]/mL — ABNORMAL HIGH (ref 0.35–4.50)

## 2019-04-05 LAB — T3, FREE: T3, Free: 3.9 pg/mL (ref 2.3–4.2)

## 2019-04-10 ENCOUNTER — Encounter: Payer: BC Managed Care – PPO | Admitting: Family Medicine

## 2019-06-05 ENCOUNTER — Other Ambulatory Visit: Payer: Self-pay | Admitting: Family Medicine

## 2019-06-05 NOTE — Telephone Encounter (Signed)
Please try to reschedule his CPE and then send back to me for refill meds.  No labs prior.

## 2019-06-06 NOTE — Telephone Encounter (Signed)
Appointment 1/28 

## 2019-06-27 ENCOUNTER — Ambulatory Visit (INDEPENDENT_AMBULATORY_CARE_PROVIDER_SITE_OTHER): Payer: BC Managed Care – PPO | Admitting: Family Medicine

## 2019-06-27 ENCOUNTER — Other Ambulatory Visit: Payer: Self-pay

## 2019-06-27 VITALS — BP 110/80 | HR 73 | Temp 97.9°F | Ht 70.25 in | Wt 370.5 lb

## 2019-06-27 DIAGNOSIS — Z0000 Encounter for general adult medical examination without abnormal findings: Secondary | ICD-10-CM

## 2019-06-27 MED ORDER — ROSUVASTATIN CALCIUM 40 MG PO TABS: 40.0000 mg | ORAL_TABLET | Freq: Every day | ORAL | 3 refills | Status: DC

## 2019-06-27 MED ORDER — DICLOFENAC SODIUM 75 MG PO TBEC: 75.0000 mg | DELAYED_RELEASE_TABLET | Freq: Every evening | ORAL | 1 refills | Status: DC

## 2019-06-27 MED ORDER — HYDROCHLOROTHIAZIDE 25 MG PO TABS: 25.0000 mg | ORAL_TABLET | Freq: Every day | ORAL | 3 refills | Status: DC

## 2019-06-27 MED ORDER — LEVOTHYROXINE SODIUM 100 MCG PO TABS: 100.0000 ug | ORAL_TABLET | Freq: Every day | ORAL | 3 refills | Status: DC

## 2019-06-27 MED ORDER — METOPROLOL TARTRATE 25 MG PO TABS: 25.0000 mg | ORAL_TABLET | Freq: Two times a day (BID) | ORAL | 3 refills | Status: DC

## 2019-06-27 MED ORDER — ESCITALOPRAM OXALATE 20 MG PO TABS: 20.0000 mg | ORAL_TABLET | Freq: Every day | ORAL | 3 refills | Status: DC

## 2019-06-27 NOTE — Progress Notes (Signed)
Steve Kory T. Khaleef Ruby, MD Primary Care and Sports Medicine [facility] at [facility] [address] [place], [address] Phone: [phone]  FAX: [phone]  Steve Ashley - 54 y.o. male  MRN [id number]  Date of Birth: [birth year]  Visit Date: 06/27/2019  PCP: Hannah Beat, MD  Referred by: Hannah Beat, MD  Chief Complaint  Patient presents with  . Annual Exam    This visit occurred during the SARS-CoV-2 public health emergency.  Safety protocols were in place, including screening questions prior to the visit, additional usage of staff PPE, and extensive cleaning of exam room while observing appropriate contact time as indicated for disinfecting solutions.   Patient Care Team: Hannah Beat, MD as PCP - General Subjective:   Steve Ashley is a 54 y.o. pleasant patient who presents with the following:  Preventative Health Maintenance Visit:  Health Maintenance Summary Reviewed and updated, unless pt declines services.  Tobacco History Reviewed. Alcohol: No concerns, no excessive use Exercise Habits: Some activity, rec at least 30 mins 5 times a week, he is trying to work out more. STD concerns: no risk or activity to increase risk Drug Use: None Encouraged self-testicular check  He did have a STEMI last year.  Wt Readings from Last 3 Encounters:  06/27/19 (!) 370 lb 8 oz (168.1 kg)  05/17/18 (!) 344 lb (156 kg)  04/25/18 (!) 350 lb 4 oz (158.9 kg)    Had 372 Wants to get down to 250  He is working with a specific diet company [employer]  Lot some weight at the beginning Eats for fun Got some braces  Many exercise equipment  Knee is bad  Pneumovax  Health Maintenance  Topic Date Due  . INFLUENZA VACCINE  08/28/2019 (Originally 12/29/2018)  . COLONOSCOPY  12/17/2024  . TETANUS/TDAP  10/18/2025  . HIV Screening  Completed   Immunization History  Administered Date(s) Administered  . Tdap 10/19/2015   Patient  Active Problem List   Diagnosis Date Noted  . Accelerated hypertension 04/16/2018  . NSTEMI (non-ST elevated myocardial infarction) (HCC)   . Depression, major, single episode, moderate (HCC) 03/09/2017  . Family history of prostate cancer 10/10/2013  . Severe obesity (BMI >= 40) (HCC) 05/09/2013  . OSA (obstructive sleep apnea) 08/29/2012  . Hypothyroidism 01/06/2010  . Hyperlipidemia LDL goal <70 01/06/2010  . HALLUX RIGIDUS 07/20/2009    Past Medical History:  Diagnosis Date  . Ankle fracture yrs ago   left   . Arthritis   . Asthma     hx of childhood asthma, none current  . Hyperlipidemia   . Hypertension   . Hypothyroidism   . Labral tear of shoulder    rotator cuff and Labrum tear, Right  . Morbid obesity (HCC)   . Myocardial infarction (HCC)    per pt   . Sleep apnea    wears CPAP 5.0-15.0  . Syndesmotic ankle sprain yrs ago    Past Surgical History:  Procedure Laterality Date  . ANKLE SURGERY  age 54   left ankle   . BACK SURGERY    . COLONOSCOPY    . HERNIA REPAIR    . KNEE ARTHROSCOPY  yrs ago   right  . LEFT HEART CATH AND CORONARY ANGIOGRAPHY Left 04/17/2018   Procedure: LEFT HEART CATH AND CORONARY ANGIOGRAPHY;  Surgeon: Marcina Millard, MD;  Location: [facility] INVASIVE CV LAB;  Service: Cardiovascular;  Laterality: Left;  . SHOULDER ARTHROSCOPY WITH SUBACROMIAL DECOMPRESSION Right  05/17/2018   Procedure: SHOULDER ARTHROSCOPY WITH SUBACROMIAL DECOMPRESSION;  Surgeon: Jones Broom, MD;  Location: [facility] OR;  Service: Orthopedics;  Laterality: Right;  . THUMB ARTHROSCOPY  age 26   left   . VENTRAL HERNIA REPAIR  12/08/2011   Procedure: LAPAROSCOPIC VENTRAL HERNIA;  Surgeon: Atilano Ina, MD,FACS;  Location: [facility] ORS;  Service: General;  Laterality: N/A;  . WISDOM TOOTH EXTRACTION      Past Medical History, Surgical History, Social History, Family History, Problem List, Medications, and Allergies have been reviewed and updated if relevant.   General:  Denies fever, chills, sweats. No significant weight loss. Eyes: Denies blurring,significant itching ENT: Denies earache, sore throat, and hoarseness. Cardiovascular: Denies chest pains, palpitations, dyspnea on exertion Respiratory: Denies cough, dyspnea at rest,wheeezing Breast: no concerns about lumps GI: Denies nausea, vomiting, diarrhea, constipation, change in bowel habits, abdominal pain, melena, hematochezia GU: Denies penile discharge, ED, urinary flow / outflow problems. No STD concerns. Musculoskeletal: Denies back pain, joint pain Derm: Denies rash, itching Neuro: Denies  paresthesias, frequent falls, frequent headaches Psych: Denies depression, anxiety Endocrine: Denies cold intolerance, heat intolerance, polydipsia Heme: Denies enlarged lymph nodes Allergy: No hayfever  Objective:   BP 110/80   Pulse 73   Temp 97.9 F (36.6 C) (Temporal)   Ht 5' 10.25" (1.784 m)   Wt (!) 370 lb 8 oz (168.1 kg)   SpO2 96%   BMI 52.78 kg/m  Ideal Body Weight: Weight in (lb) to have BMI = 25: 175.1  Ideal Body Weight: Weight in (lb) to have BMI = 25: 175.1 No exam data present Depression screen [facility] 2/9 10/12/2017 04/24/2017 03/09/2017 01/26/2017 12/15/2016  Decreased Interest 0 1 1 - 3  Down, Depressed, Hopeless 0 0 0 1 2  PHQ - 2 Score 0 1 1 1 5   Altered sleeping - 1 1 2 2   Tired, decreased energy - 1 2 2 2   Change in appetite - 1 1 1 2   Feeling bad or failure about yourself  - 1 1 1 2   Trouble concentrating - 0 1 1 0  Moving slowly or fidgety/restless - 0 1 1 0  Suicidal thoughts - 0 0 0 0  PHQ-9 Score - 5 8 9 13   Difficult doing work/chores - Not difficult at all Somewhat difficult Somewhat difficult -     GEN: well developed, well nourished, no acute distress Eyes: conjunctiva and lids normal, PERRLA, EOMI ENT: TM clear, nares clear, oral exam WNL Neck: supple, no lymphadenopathy, no thyromegaly, no JVD Pulm: clear to auscultation and percussion, respiratory effort  normal CV: regular rate and rhythm, S1-S2, no murmur, rub or gallop, no bruits, peripheral pulses normal and symmetric, no cyanosis, clubbing, edema or varicosities GI: soft, non-tender; no hepatosplenomegaly, masses; active bowel sounds all quadrants GU: no hernia, testicular mass, penile discharge Lymph: no cervical, axillary or inguinal adenopathy MSK: gait normal, muscle tone and strength WNL, no joint swelling, effusions, discoloration, crepitus  SKIN: clear, good turgor, color WNL, no rashes, lesions, or ulcerations Neuro: normal mental status, normal strength, sensation, and motion Psych: alert; oriented to person, place and time, normally interactive and not anxious or depressed in appearance. All labs reviewed with patient. Results for orders placed or performed in visit on 04/04/19  Hemoglobin A1c  Result Value Ref Range   Hgb A1c MFr Bld 6.2 4.6 - 6.5 %  T4, free  Result Value Ref Range   Free T4 0.67 0.60 - 1.60 ng/dL  T3, free  Result  Value Ref Range   T3, Free 3.9 2.3 - 4.2 pg/mL  TSH  Result Value Ref Range   TSH 11.37 (H) 0.35 - 4.50 uIU/mL  PSA, Total with Reflex to PSA, Free  Result Value Ref Range   PSA, Total 0.4 < OR = 4.0 ng/mL  Basic metabolic panel  Result Value Ref Range   Sodium 140 135 - 145 mEq/L   Potassium 4.2 3.5 - 5.1 mEq/L   Chloride 100 96 - 112 mEq/L   CO2 33 (H) 19 - 32 mEq/L   Glucose, Bld 92 70 - 99 mg/dL   BUN 18 6 - 23 mg/dL   Creatinine, Ser 8.11 0.40 - 1.50 mg/dL   GFR 91.47 >82.95 mL/min   Calcium 9.6 8.4 - 10.5 mg/dL  Hepatic function panel  Result Value Ref Range   Total Bilirubin 0.8 0.2 - 1.2 mg/dL   Bilirubin, Direct 0.2 0.0 - 0.3 mg/dL   Alkaline Phosphatase 65 39 - 117 U/L   AST 23 0 - 37 U/L   ALT 28 0 - 53 U/L   Total Protein 7.8 6.0 - 8.3 g/dL   Albumin 4.7 3.5 - 5.2 g/dL  CBC with Differential/Platelet  Result Value Ref Range   WBC 7.0 4.0 - 10.5 K/uL   RBC 4.99 4.22 - 5.81 Mil/uL   Hemoglobin 14.3 13.0 - 17.0  g/dL   HCT 62.1 30.8 - 65.7 %   MCV 88.6 78.0 - 100.0 fl   MCHC 32.3 30.0 - 36.0 g/dL   RDW 84.6 96.2 - 95.2 %   Platelets 229.0 150.0 - 400.0 K/uL   Neutrophils Relative % 47.1 43.0 - 77.0 %   Lymphocytes Relative 36.7 12.0 - 46.0 %   Monocytes Relative 10.7 3.0 - 12.0 %   Eosinophils Relative 4.4 0.0 - 5.0 %   Basophils Relative 1.1 0.0 - 3.0 %   Neutro Abs 3.3 1.4 - 7.7 K/uL   Lymphs Abs 2.6 0.7 - 4.0 K/uL   Monocytes Absolute 0.8 0.1 - 1.0 K/uL   Eosinophils Absolute 0.3 0.0 - 0.7 K/uL   Basophils Absolute 0.1 0.0 - 0.1 K/uL  Lipid panel  Result Value Ref Range   Cholesterol 152 0 - 200 mg/dL   Triglycerides 84.1 0.0 - 149.0 mg/dL   HDL 32.44 >01.02 mg/dL   VLDL 72.5 0.0 - 36.6 mg/dL   LDL Cholesterol 93 0 - 99 mg/dL   Total CHOL/HDL Ratio 4    NonHDL 111.25     Assessment and Plan:     ICD-10-CM   1. Healthcare maintenance  Z00.00    Globally the main issue with him again is with weight loss.  He is doing his best to try and alter his diet.  Working out a lot more.  I congratulated him on this.  His goal weight is 250 pounds.  Health Maintenance Exam: The patient's preventative maintenance and recommended screening tests for an annual wellness exam were reviewed in full today. Brought up to date unless services declined.  Counselled on the importance of diet, exercise, and its role in overall health and mortality. The patient's FH and SH was reviewed, including their home life, tobacco status, and drug and alcohol status.  Follow-up in 1 year for physical exam or additional follow-up below.  Follow-up: No follow-ups on file. Or follow-up in 1 year if not noted.  Meds ordered this encounter  Medications  . diclofenac (VOLTAREN) 75 MG EC tablet    Sig: Take 1 tablet (  75 mg total) by mouth every evening.    Dispense:  90 tablet    Refill:  1  . escitalopram (LEXAPRO) 20 MG tablet    Sig: Take 1 tablet (20 mg total) by mouth daily.    Dispense:  90 tablet     Refill:  3  . metoprolol tartrate (LOPRESSOR) 25 MG tablet    Sig: Take 1 tablet (25 mg total) by mouth 2 (two) times daily.    Dispense:  180 tablet    Refill:  3  . hydrochlorothiazide (HYDRODIURIL) 25 MG tablet    Sig: Take 1 tablet (25 mg total) by mouth daily.    Dispense:  90 tablet    Refill:  3  . rosuvastatin (CRESTOR) 40 MG tablet    Sig: Take 1 tablet (40 mg total) by mouth daily at 6 PM.    Dispense:  90 tablet    Refill:  3  . levothyroxine (SYNTHROID) 100 MCG tablet    Sig: Take 1 tablet (100 mcg total) by mouth daily.    Dispense:  90 tablet    Refill:  3   Medications Discontinued During This Encounter  Medication Reason  . methocarbamol (ROBAXIN) 500 MG tablet Completed Course  . diclofenac (VOLTAREN) 75 MG EC tablet Reorder  . levothyroxine (SYNTHROID, LEVOTHROID) 88 MCG tablet   . hydrochlorothiazide (HYDRODIURIL) 25 MG tablet Reorder  . metoprolol tartrate (LOPRESSOR) 25 MG tablet Reorder  . rosuvastatin (CRESTOR) 40 MG tablet Reorder  . escitalopram (LEXAPRO) 20 MG tablet Reorder   No orders of the defined types were placed in this encounter.   Signed,  Maud Deed. Isaack Preble, MD   Allergies as of 06/27/2019      Reactions   Latex Itching      Medication List       Accurate as of June 27, 2019 11:59 PM. If you have any questions, ask your nurse or doctor.        STOP taking these medications   methocarbamol 500 MG tablet Commonly known as: Robaxin Stopped by: Owens Loffler, MD     TAKE these medications   diclofenac 75 MG EC tablet Commonly known as: VOLTAREN Take 1 tablet (75 mg total) by mouth every evening.   escitalopram 20 MG tablet Commonly known as: LEXAPRO Take 1 tablet (20 mg total) by mouth daily.   hydrochlorothiazide 25 MG tablet Commonly known as: HYDRODIURIL Take 1 tablet (25 mg total) by mouth daily.   levothyroxine 100 MCG tablet Commonly known as: SYNTHROID Take 1 tablet (100 mcg total) by mouth daily. What  changed:   medication strength  how much to take Changed by: Owens Loffler, MD   metoprolol tartrate 25 MG tablet Commonly known as: LOPRESSOR Take 1 tablet (25 mg total) by mouth 2 (two) times daily.   rosuvastatin 40 MG tablet Commonly known as: CRESTOR Take 1 tablet (40 mg total) by mouth daily at 6 PM.   tiZANidine 4 MG tablet Commonly known as: ZANAFLEX TAKE 1 TABLET BY MOUTH EVERY DAY IN THE EVENING

## 2019-06-30 ENCOUNTER — Encounter: Payer: Self-pay | Admitting: Family Medicine

## 2019-07-20 ENCOUNTER — Emergency Department: Payer: BC Managed Care – PPO

## 2019-07-20 ENCOUNTER — Emergency Department
Admission: EM | Admit: 2019-07-20 | Discharge: 2019-07-20 | Disposition: A | Discharge status: Home / Self Care | Payer: BC Managed Care – PPO | Attending: Student | Admitting: Student

## 2019-07-20 ENCOUNTER — Other Ambulatory Visit: Payer: Self-pay

## 2019-07-20 DIAGNOSIS — I10 Essential (primary) hypertension: Secondary | ICD-10-CM | POA: Diagnosis not present

## 2019-07-20 DIAGNOSIS — N200 Calculus of kidney: Secondary | ICD-10-CM | POA: Diagnosis not present

## 2019-07-20 DIAGNOSIS — E039 Hypothyroidism, unspecified: Secondary | ICD-10-CM | POA: Diagnosis not present

## 2019-07-20 DIAGNOSIS — Z79899 Other long term (current) drug therapy: Secondary | ICD-10-CM | POA: Insufficient documentation

## 2019-07-20 DIAGNOSIS — I252 Old myocardial infarction: Secondary | ICD-10-CM | POA: Diagnosis not present

## 2019-07-20 DIAGNOSIS — R1031 Right lower quadrant pain: Secondary | ICD-10-CM | POA: Diagnosis present

## 2019-07-20 DIAGNOSIS — Z91040 Latex allergy status: Secondary | ICD-10-CM | POA: Diagnosis not present

## 2019-07-20 LAB — URINALYSIS, COMPLETE (UACMP) WITH MICROSCOPIC
Bacteria, UA: NONE SEEN
Bilirubin Urine: NEGATIVE
Glucose, UA: NEGATIVE mg/dL
Ketones, ur: NEGATIVE mg/dL
Nitrite: NEGATIVE
Protein, ur: 30 mg/dL — AB
Specific Gravity, Urine: 1.025 (ref 1.005–1.030)
pH: 5 (ref 5.0–8.0)

## 2019-07-20 LAB — BASIC METABOLIC PANEL
Anion gap: 9 (ref 5–15)
BUN: 17 mg/dL (ref 6–20)
CO2: 31 mmol/L (ref 22–32)
Calcium: 9.6 mg/dL (ref 8.9–10.3)
Chloride: 102 mmol/L (ref 98–111)
Creatinine, Ser: 1.17 mg/dL (ref 0.61–1.24)
GFR calc Af Amer: >60 mL/min (ref >60)
GFR calc non Af Amer: >60 mL/min (ref >60)
Glucose, Bld: 128 mg/dL — ABNORMAL HIGH (ref 70–99)
Potassium: 4 mmol/L (ref 3.5–5.1)
Sodium: 142 mmol/L (ref 135–145)

## 2019-07-20 LAB — CBC
HCT: 45.5 % (ref 39.0–52.0)
Hemoglobin: 14.5 g/dL (ref 13.0–17.0)
MCH: 27.8 pg (ref 26.0–34.0)
MCHC: 31.9 g/dL (ref 30.0–36.0)
MCV: 87.2 fL (ref 80.0–100.0)
Platelets: 238 10*3/uL (ref 150–400)
RBC: 5.22 MIL/uL (ref 4.22–5.81)
RDW: 14.7 % (ref 11.5–15.5)
WBC: 10.8 10*3/uL — ABNORMAL HIGH (ref 4.0–10.5)
nRBC: 0 % (ref 0.0–0.2)

## 2019-07-20 MED ORDER — OXYCODONE-ACETAMINOPHEN 5-325 MG PO TABS: 1.0000 | ORAL_TABLET | Freq: Four times a day (QID) | ORAL | 0 refills | Status: AC | PRN

## 2019-07-20 MED ORDER — ONDANSETRON HCL 4 MG PO TABS: 4.0000 mg | ORAL_TABLET | Freq: Three times a day (TID) | ORAL | 0 refills | Status: AC | PRN

## 2019-07-20 MED ORDER — HYDROMORPHONE HCL 1 MG/ML IJ SOLN
0.5000 mg | Freq: Once | INTRAMUSCULAR | Status: AC
  Administered 2019-07-20: 0.5 mg via INTRAVENOUS
  Filled 2019-07-20: qty 1

## 2019-07-20 MED ORDER — SODIUM CHLORIDE 0.9 % IV BOLUS
1000.0000 mL | Freq: Once | INTRAVENOUS | Status: AC
  Administered 2019-07-20: 1000 mL via INTRAVENOUS

## 2019-07-20 MED ORDER — TAMSULOSIN HCL 0.4 MG PO CAPS: 0.4000 mg | ORAL_CAPSULE | Freq: Every day | ORAL | 0 refills | Status: AC

## 2019-07-20 MED ORDER — ONDANSETRON HCL 4 MG/2ML IJ SOLN
4.0000 mg | Freq: Once | INTRAMUSCULAR | Status: AC
  Administered 2019-07-20: 14:00:00 4 mg via INTRAVENOUS
  Filled 2019-07-20: qty 2

## 2019-07-20 MED ORDER — FENTANYL CITRATE (PF) 100 MCG/2ML IJ SOLN
50.0000 ug | INTRAMUSCULAR | Status: DC | PRN
  Administered 2019-07-20: 50 ug via INTRAVENOUS
  Filled 2019-07-20: qty 2

## 2019-07-20 NOTE — ED Notes (Signed)
Pt calling out from subwait stating that the pain has moved into his groin area. Pt informed that it is too soon for another dose of medication. Pt ambulated to the restroom to try to urinate.   Pt ambulated without difficulty or distress.

## 2019-07-20 NOTE — ED Provider Notes (Signed)
Emergency Department Provider Note  ____________________________________________  Time seen: Approximately 4:34 PM  I have reviewed the triage vital signs and the nursing notes.   HISTORY  Chief Complaint Flank Pain   Historian Patient    HPI Steve Ashley is a 54 y.o. male presents to the emergency department with right-sided flank pain that started this morning.  Patient states that he has a history of nephrolithiasis and states that pain feels similar.  He denies dysuria, hematuria or increased urinary frequency.  Patient states that last renal stone occurred approximately 15 years ago.  No chest pain, chest tightness or abdominal pain.  No other alleviating measures have been attempted.   Past Medical History:  Diagnosis Date  . Ankle fracture yrs ago   left   . Arthritis   . Asthma     hx of childhood asthma, none current  . Hyperlipidemia   . Hypertension   . Hypothyroidism   . Labral tear of shoulder    rotator cuff and Labrum tear, Right  . Morbid obesity (HCC)   . Myocardial infarction (HCC)    per pt   . Sleep apnea    wears CPAP 5.0-15.0  . Syndesmotic ankle sprain yrs ago     Immunizations up to date:  Yes.     Past Medical History:  Diagnosis Date  . Ankle fracture yrs ago   left   . Arthritis   . Asthma     hx of childhood asthma, none current  . Hyperlipidemia   . Hypertension   . Hypothyroidism   . Labral tear of shoulder    rotator cuff and Labrum tear, Right  . Morbid obesity (HCC)   . Myocardial infarction (HCC)    per pt   . Sleep apnea    wears CPAP 5.0-15.0  . Syndesmotic ankle sprain yrs ago    Patient Active Problem List   Diagnosis Date Noted  . Accelerated hypertension 04/16/2018  . NSTEMI (non-ST elevated myocardial infarction) (HCC)   . Depression, major, single episode, moderate (HCC) 03/09/2017  . Family history of prostate cancer 10/10/2013  . Severe obesity (BMI >= 40) (HCC) 05/09/2013  . OSA (obstructive  sleep apnea) 08/29/2012  . Hypothyroidism 01/06/2010  . Hyperlipidemia LDL goal <70 01/06/2010  . HALLUX RIGIDUS 07/20/2009    Past Surgical History:  Procedure Laterality Date  . ANKLE SURGERY  age 66   left ankle   . BACK SURGERY    . COLONOSCOPY    . HERNIA REPAIR    . KNEE ARTHROSCOPY  yrs ago   right  . LEFT HEART CATH AND CORONARY ANGIOGRAPHY Left 04/17/2018   Procedure: LEFT HEART CATH AND CORONARY ANGIOGRAPHY;  Surgeon: Marcina Millard, MD;  Location: [facility] INVASIVE CV LAB;  Service: Cardiovascular;  Laterality: Left;  . SHOULDER ARTHROSCOPY WITH SUBACROMIAL DECOMPRESSION Right 05/17/2018   Procedure: SHOULDER ARTHROSCOPY WITH SUBACROMIAL DECOMPRESSION;  Surgeon: Jones Broom, MD;  Location: [facility] OR;  Service: Orthopedics;  Laterality: Right;  . THUMB ARTHROSCOPY  age 4   left   . VENTRAL HERNIA REPAIR  12/08/2011   Procedure: LAPAROSCOPIC VENTRAL HERNIA;  Surgeon: Atilano Ina, MD,FACS;  Location: [facility] ORS;  Service: General;  Laterality: N/A;  . WISDOM TOOTH EXTRACTION      Prior to Admission medications   Medication Sig Start Date End Date Taking? Authorizing Provider  diclofenac (VOLTAREN) 75 MG EC tablet Take 1 tablet (75 mg total) by mouth every evening. 06/27/19   Hannah Beat, MD  escitalopram (LEXAPRO) 20 MG tablet Take 1 tablet (20 mg total) by mouth daily. 06/27/19   [name], Karleen Hampshire, MD  hydrochlorothiazide (HYDRODIURIL) 25 MG tablet Take 1 tablet (25 mg total) by mouth daily. 06/27/19 07/27/19  [name], Karleen Hampshire, MD  levothyroxine (SYNTHROID) 100 MCG tablet Take 1 tablet (100 mcg total) by mouth daily. 06/27/19   [name], Karleen Hampshire, MD  metoprolol tartrate (LOPRESSOR) 25 MG tablet Take 1 tablet (25 mg total) by mouth 2 (two) times daily. 06/27/19 07/27/19  [name], Karleen Hampshire, MD  oxyCODONE-acetaminophen (PERCOCET/ROXICET) 5-325 MG tablet Take 1 tablet by mouth every 6 (six) hours as needed for up to 3 days. 07/20/19 07/23/19  Orvil Feil, PA-C  rosuvastatin  (CRESTOR) 40 MG tablet Take 1 tablet (40 mg total) by mouth daily at 6 PM. 06/27/19 07/27/19  [name], Karleen Hampshire, MD  tamsulosin (FLOMAX) 0.4 MG CAPS capsule Take 1 capsule (0.4 mg total) by mouth daily for 5 days. 07/20/19 07/25/19  Orvil Feil, PA-C  tiZANidine (ZANAFLEX) 4 MG tablet TAKE 1 TABLET BY MOUTH EVERY DAY IN THE EVENING 12/31/18   [name], Karleen Hampshire, MD    Allergies Latex  Family History  Problem Relation Age of Onset  . Hypertension Mother   . Cancer Father        prostate  . Heart disease Neg Hx   . Colon cancer Neg Hx   . Diabetes Neg Hx     Social History Social History   Tobacco Use  . Smoking status: Never Smoker  . Smokeless tobacco: Never Used  Substance Use Topics  . Alcohol use: Yes    Alcohol/week: 0.0 standard drinks    Comment: occassional; once monthly  . Drug use: Never     Review of Systems  Constitutional: No fever/chills Eyes:  No discharge ENT: No upper respiratory complaints. Respiratory: no cough. No SOB/ use of accessory muscles to breath Gastrointestinal:   No nausea, no vomiting.  No diarrhea.  No constipation. Genitourinary: Patient has right sided flank pain.  Musculoskeletal: Negative for musculoskeletal pain. Skin: Negative for rash, abrasions, lacerations, ecchymosis.    ____________________________________________   PHYSICAL EXAM:  VITAL SIGNS: ED Triage Vitals  Enc Vitals Group     BP 07/20/19 1237 (!) 153/81     Pulse Rate 07/20/19 1237 78     Resp 07/20/19 1237 18     Temp 07/20/19 1237 98.4 F (36.9 C)     Temp Source 07/20/19 1237 Oral     SpO2 07/20/19 1237 98 %     Weight 07/20/19 1236 (!) 363 lb (164.7 kg)     Height 07/20/19 1236 5\' 11"  (1.803 m)     Head Circumference --      Peak Flow --      Pain Score 07/20/19 1236 10     Pain Loc --      Pain Edu? --      Excl. in GC? --      Constitutional: Alert and oriented. Well appearing and in no acute distress. Eyes: Conjunctivae are normal. PERRL.  EOMI. Head: Atraumatic. Cardiovascular: Normal rate, regular rhythm. Normal S1 and S2.  Good peripheral circulation. Respiratory: Normal respiratory effort without tachypnea or retractions. Lungs CTAB. Good air entry to the bases with no decreased or absent breath sounds Gastrointestinal: Bowel sounds x 4 quadrants. Soft and nontender to palpation. No guarding or rigidity. No distention. Genitourinary: Patient has right sided CVA tenderness to palpation.  Musculoskeletal: Full range of motion to all extremities. No obvious deformities noted  Neurologic:  Normal for age. No gross focal neurologic deficits are appreciated.  Skin:  Skin is warm, dry and intact. No rash noted. Psychiatric: Mood and affect are normal for age. Speech and behavior are normal.   ____________________________________________   LABS (all labs ordered are listed, but only abnormal results are displayed)  Labs Reviewed  URINALYSIS, COMPLETE (UACMP) WITH MICROSCOPIC - Abnormal; Notable for the following components:      Result Value   Color, Urine YELLOW (*)    APPearance HAZY (*)    Hgb urine dipstick SMALL (*)    Protein, ur 30 (*)    Leukocytes,Ua SMALL (*)    All other components within normal limits  BASIC METABOLIC PANEL - Abnormal; Notable for the following components:   Glucose, Bld 128 (*)    All other components within normal limits  CBC - Abnormal; Notable for the following components:   WBC 10.8 (*)    All other components within normal limits   ____________________________________________  EKG   ____________________________________________  RADIOLOGY Geraldo Pitter, personally viewed and evaluated these images (plain radiographs) as part of my medical decision making, as well as reviewing the written report by the radiologist.  CT Renal Stone Study  Result Date: 07/20/2019 CLINICAL DATA:  Flank pain, nausea, possible kidney stone EXAM: CT ABDOMEN AND PELVIS WITHOUT CONTRAST TECHNIQUE:  Multidetector CT imaging of the abdomen and pelvis was performed following the standard protocol without IV contrast. COMPARISON:  12/05/2011 FINDINGS: Motion degraded images. Lower chest: Lung bases are clear. Hepatobiliary: Unenhanced liver is unremarkable. Gallbladder is.  No intrahepatic or extrahepatic ductal dilatation. Pancreas: Within normal limits. Spleen: Within normal limits. Adrenals/Urinary Tract: Adrenal glands are within normal limits. 8.2 x 6.9 cm right lower pole renal cyst (series 2/image 52), previously 5.5 x 5.0 cm, although simple/benign. Additional suspected 14 mm cyst in the posterior right upper kidney (series 2/image 42). 5 mm nonobstructing right upper pole renal calculus (series 2/image 43). Left kidney is within normal limits. No hydronephrosis. Mild fullness of the distal right ureter with a suspected punctate distal right ureteral calculus at the UVJ (series 2/image 84). Thick-walled bladder, although underdistended. Stomach/Bowel: Stomach is within normal limits. No evidence of bowel obstruction. Normal appendix (series 2/image 61). Left colonic diverticulosis, without evidence of diverticulitis. Vascular/Lymphatic: No evidence of abdominal aortic aneurysm. No suspicious abdominopelvic lymphadenopathy. Reproductive: Prostate is notable for dystrophic calcifications. Other: No abdominopelvic ascites. Moderate fat in the left inguinal canal. Musculoskeletal: Visualized osseous structures are within normal limits. IMPRESSION: Punctate distal right ureteral calculus at the UVJ. Mild fullness of the distal right ureter without frank hydronephrosis. 5 mm nonobstructing right upper pole renal calculus. 8.2 cm right lower pole renal cyst, benign. Electronically Signed   By: Charline Bills M.D.   On: 07/20/2019 15:39    ____________________________________________    PROCEDURES  Procedure(s) performed:     Procedures     Medications  fentaNYL (SUBLIMAZE) injection 50 mcg  (50 mcg Intravenous Given 07/20/19 1347)  ondansetron (ZOFRAN) injection 4 mg (4 mg Intravenous Given 07/20/19 1346)  sodium chloride 0.9 % bolus 1,000 mL (0 mLs Intravenous Stopped 07/20/19 1511)  HYDROmorphone (DILAUDID) injection 0.5 mg (0.5 mg Intravenous Given 07/20/19 1511)     ____________________________________________   INITIAL IMPRESSION / ASSESSMENT AND PLAN / ED COURSE  Pertinent labs & imaging results that were available during my care of the patient were reviewed by me and considered in my medical decision making (see chart for details).  Assessment and Plan:  Nephrolithiasis 54 year old male presents to the emergency department with right-sided flank pain for less than 24 hours.  Patient was hypertensive at triage but vital signs were otherwise reassuring.  Patient reports a history of recent increase consumption of sodas and is concerned that a kidney stone might have subsequently occurred.  Patient had right CVA tenderness to palpation but no abdominal tenderness.  CT renal stone study reveals a punctate stone of the distal ureter at the UVJ without signs of obstruction.  There is also some fullness of the distal right ureter without frank hydronephrosis.  Patient was discharged with Flomax and Percocet.  He was advised to follow-up with urology as needed.  Return precautions were given to return with new or worsening symptoms.  All patient questions were answered.     ____________________________________________  FINAL CLINICAL IMPRESSION(S) / ED DIAGNOSES  Final diagnoses:  Nephrolithiasis      NEW MEDICATIONS STARTED DURING THIS VISIT:  ED Discharge Orders         Ordered    oxyCODONE-acetaminophen (PERCOCET/ROXICET) 5-325 MG tablet  Every 6 hours PRN     07/20/19 1625    tamsulosin (FLOMAX) 0.4 MG CAPS capsule  Daily     07/20/19 1625              This chart was dictated using voice recognition software/Dragon. Despite best efforts to  proofread, errors can occur which can change the meaning. Any change was purely unintentional.     Lannie Fields, PA-C 07/20/19 1638    Lilia Pro., MD 07/24/19 [phone]

## 2019-07-20 NOTE — ED Notes (Signed)
Pt in lobby, pt started vomiting and became diaphoretic. Pt reports increase in pain. Pt was taken to triage area for triage RN to start IV. This RN spoke with Dr. Roxan Hockey and obtained verbal order for Fentanyl 50 mcg, zofran 4 mg, and 1 liter NS bolus.

## 2019-07-20 NOTE — ED Triage Notes (Signed)
Pt sent from [facility] for possible kidney stone on R flank. States nausea but denies vomiting/fever. Hx of kidney stone. A&O.

## 2019-11-24 IMAGING — CT CT ANGIO CHEST
3 of 8 series · 17 of 46 positions shown · IV contrast (omnipaque)
Comparison: Chest radiograph April 16, 2018

CLINICAL DATA: Chest pain

EXAM:
CT ANGIOGRAPHY CHEST WITH CONTRAST
TECHNIQUE: Multidetector CT imaging of the chest was performed using the
standard protocol during bolus administration of intravenous
contrast. Multiplanar CT image reconstructions and MIPs were
obtained to evaluate the vascular anatomy.
CONTRAST:  150 mL OMNIPAQUE IOHEXOL 350 MG/ML SOLN

[Series 5: thins · axial · 0.68mm/px · z∈[-273,-49]mm · 10 of 274 slices shown (1 of 2)]
[im 25/274  lung]
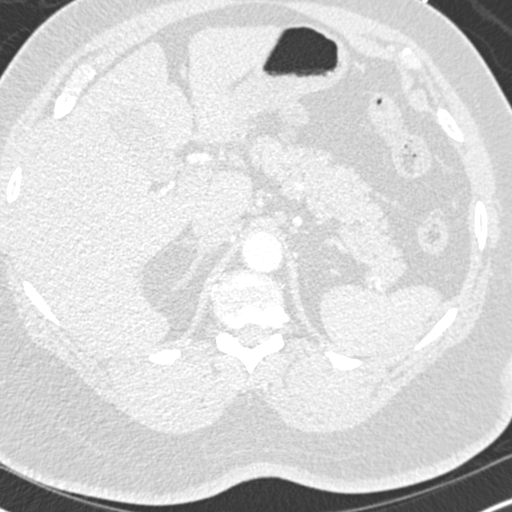
[im 50/274  soft-tissue]
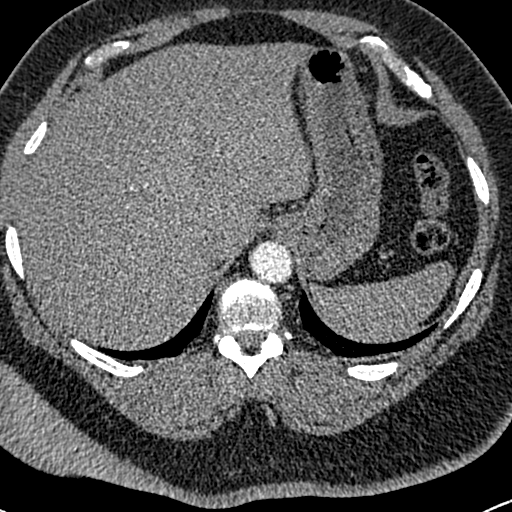
[im 75/274  lung]
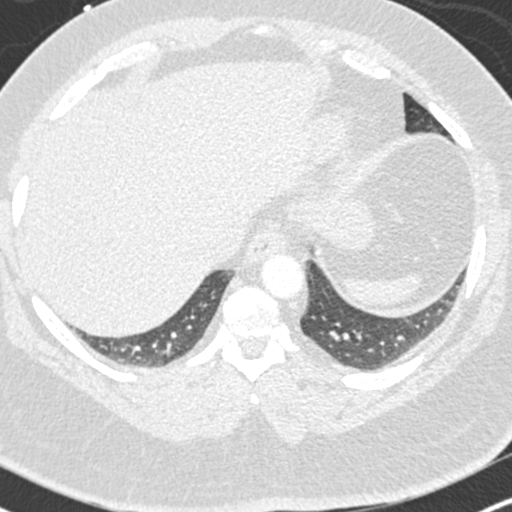
[im 100/274  soft-tissue]
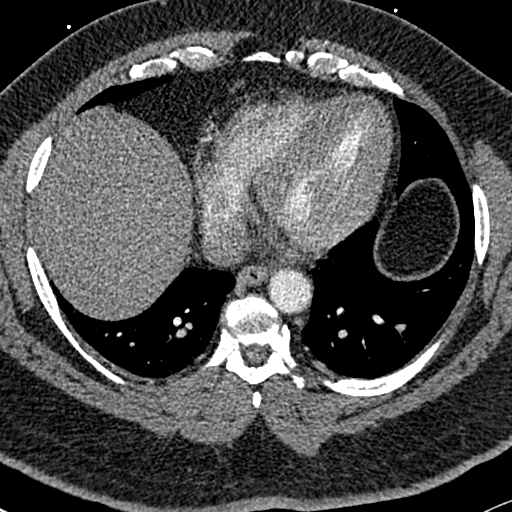
[im 125/274  lung]
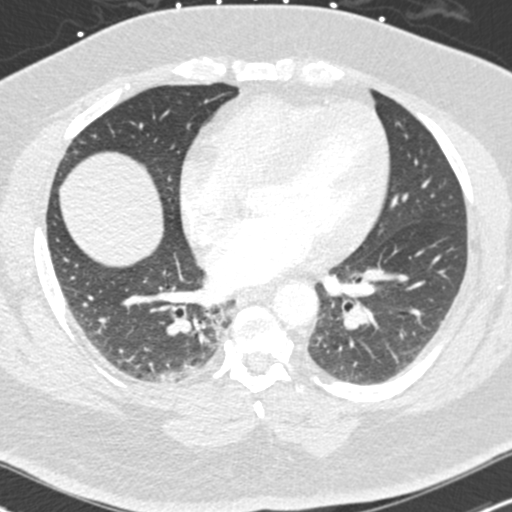
[im 149/274  soft-tissue]
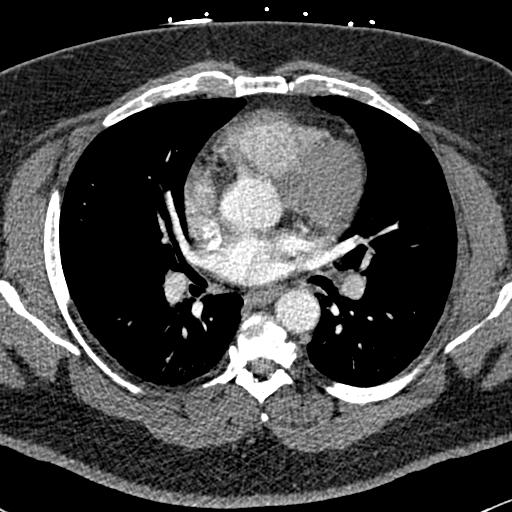
[im 174/274  lung]
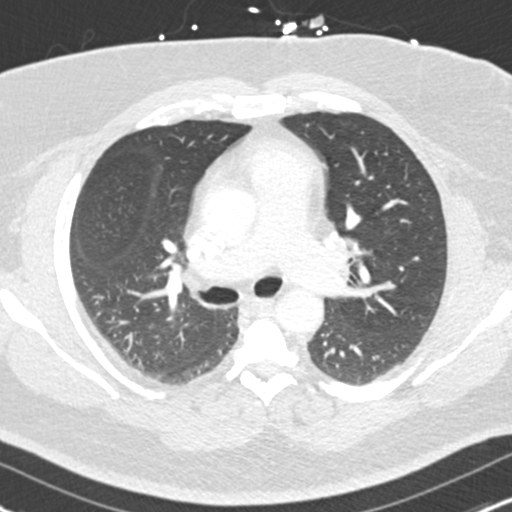
[im 199/274  soft-tissue]
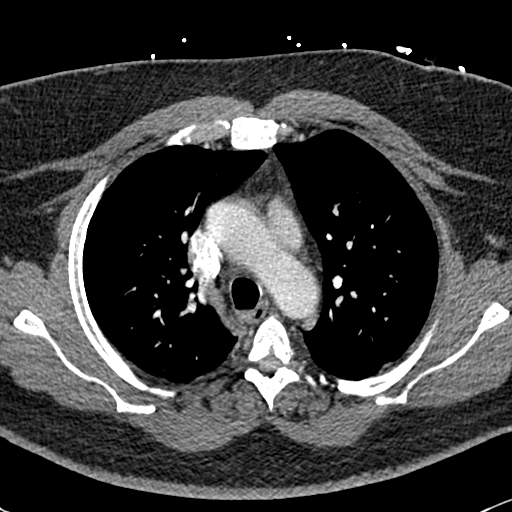
[im 224/274  lung]
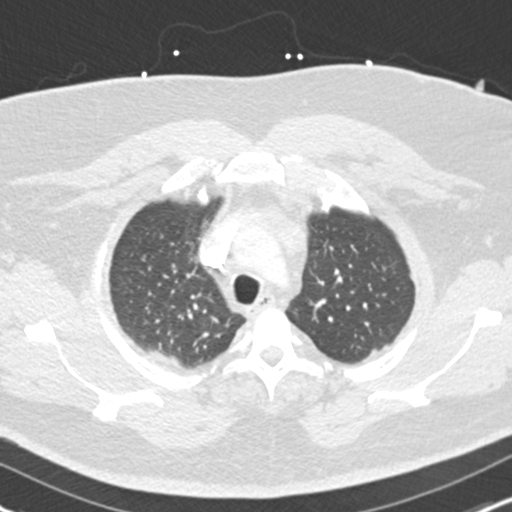
[im 249/274  soft-tissue]
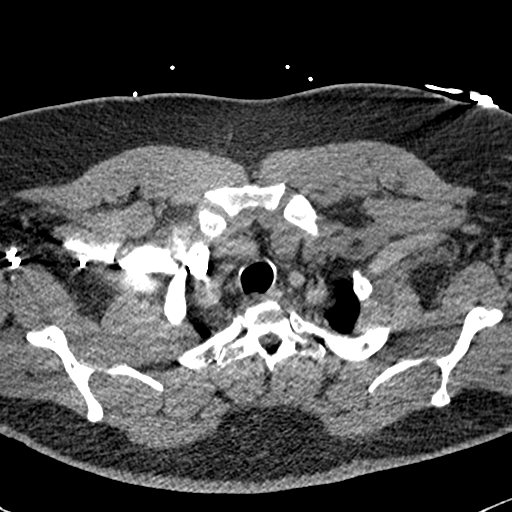

[Series 10: thins · axial · 0.73mm/px · z∈[-305,-205]mm · 4 of 275 slices shown (2 of 2)]
[im 25/275  lung]
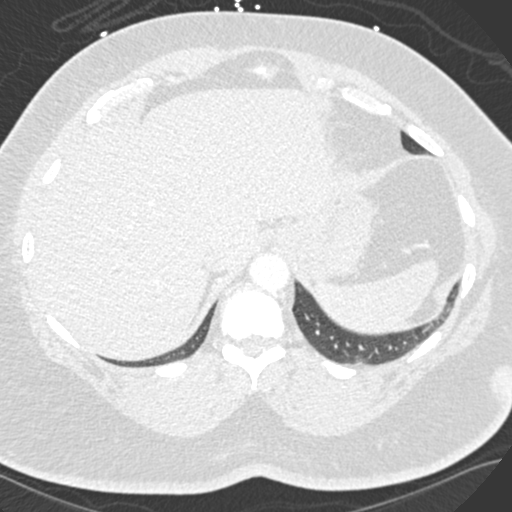
[im 50/275  lung]
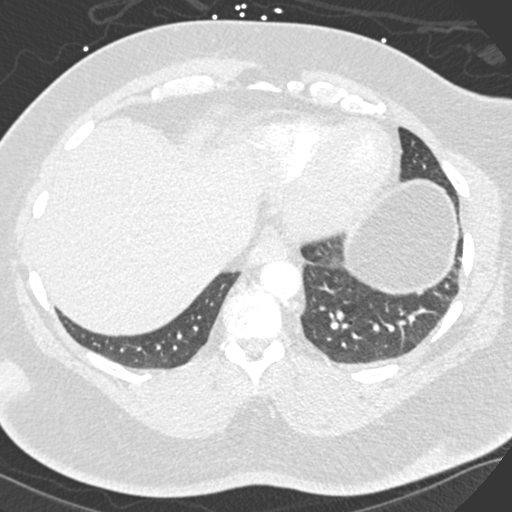
[im 100/275  lung]
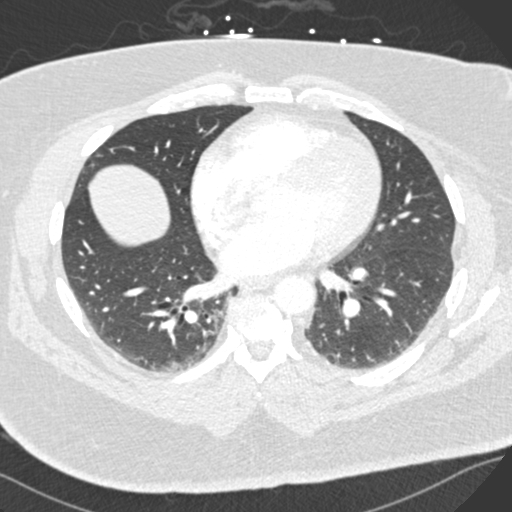
[im 125/275  lung]
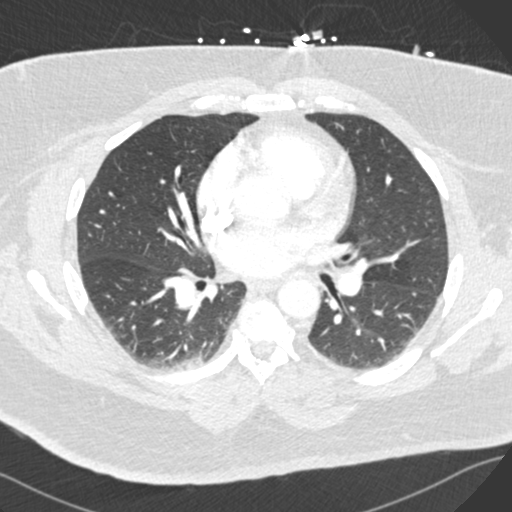

[Series 17: coronal mpr · coronal · 0.54mm/px · 3 of 83 slices shown]
[im 21/83  soft-tissue]
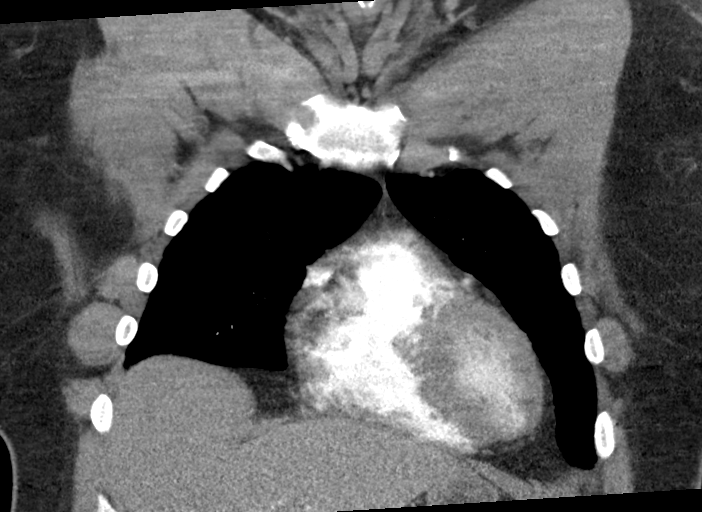
[im 42/83  soft-tissue]
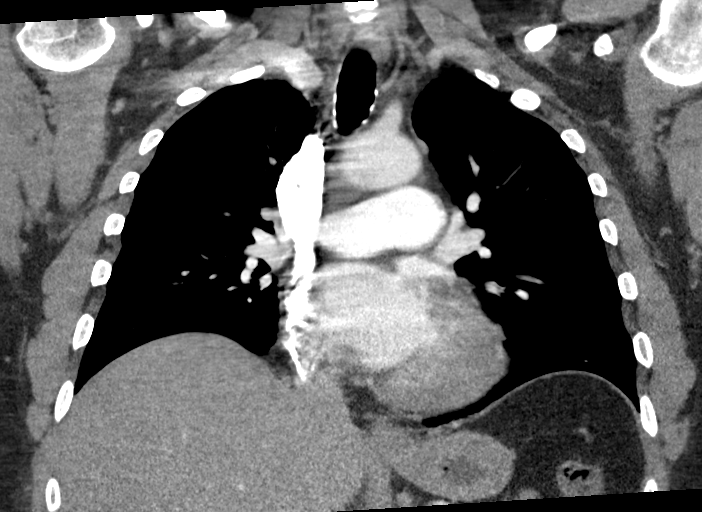
[im 62/83  soft-tissue]
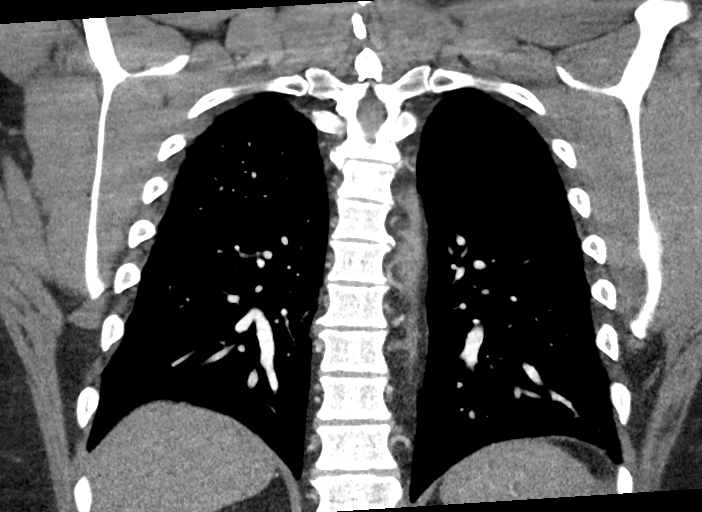

[17 of 46 positions shown; findings below may reference images not displayed]

FINDINGS: Cardiovascular: There is no demonstrable pulmonary embolus. There is
no thoracic aortic aneurysm or dissection. The visualized great
vessels appear normal. There is no pericardial effusion or
pericardial thickening. There is a degree of left ventricular
hypertrophy.

There is prominence of the main pulmonary outflow tract measuring
3.4 cm.

Mediastinum/Nodes: Thyroid appears unremarkable. There is no
appreciable thoracic adenopathy. There is a small hiatal hernia.

Lungs/Pleura: There is mild bibasilar atelectatic change. There is
no appreciable lung edema or consolidation. On axial slice 21 series
11, there is a 2 mm nodular opacity in the anterior segment right
upper lobe. On axial slice 46 series 11, there is a 3 mm nodular
opacity abutting the pleura in the lateral segment of the right
middle lobe. No evident pleural effusion or pleural thickening.

Upper Abdomen: Visualized upper abdominal structures appear
unremarkable.

Musculoskeletal: There are no blastic or lytic bone lesions. No
evident chest wall lesions.

Review of the MIP images confirms the above findings.
IMPRESSION: 1. No demonstrable pulmonary embolus. There is no thoracic aortic
aneurysm or dissection.

2. Prominence the main pulmonary outflow tract, a finding felt to be
indicative of a degree of pulmonary arterial hypertension.

3.  There is left ventricular hypertrophy.

4. Bibasilar atelectasis. No edema or consolidation. Small nodular
opacities on the right, largest measuring 3 mm. No follow-up needed
if patient is low-risk (and has no known or suspected primary
neoplasm). Non-contrast chest CT can be considered in 12 months if
patient is high-risk. This recommendation follows the consensus
statement: Guidelines for Management of Incidental Pulmonary Nodules
Detected on CT Images: From the [HOSPITAL] 8543; Radiology
8543; [DATE].

5.  No demonstrable thoracic adenopathy.

6.  Small hiatal hernia.

## 2019-12-27 ENCOUNTER — Other Ambulatory Visit: Payer: Self-pay | Admitting: Family Medicine

## 2019-12-27 NOTE — Telephone Encounter (Signed)
Last office visit 06/27/2019 for CPE.  Last refilled 06/27/2019 for #90 with 1 refill.  No future appointments.

## 2020-01-28 ENCOUNTER — Emergency Department
Admission: EM | Admit: 2020-01-28 | Discharge: 2020-01-28 | Disposition: A | Discharge status: Home / Self Care | Payer: BC Managed Care – PPO | Attending: Emergency Medicine | Admitting: Emergency Medicine

## 2020-01-28 ENCOUNTER — Other Ambulatory Visit: Payer: Self-pay

## 2020-01-28 DIAGNOSIS — R110 Nausea: Secondary | ICD-10-CM | POA: Diagnosis not present

## 2020-01-28 DIAGNOSIS — Z5321 Procedure and treatment not carried out due to patient leaving prior to being seen by health care provider: Secondary | ICD-10-CM | POA: Insufficient documentation

## 2020-01-28 DIAGNOSIS — Z20822 Contact with and (suspected) exposure to covid-19: Secondary | ICD-10-CM | POA: Insufficient documentation

## 2020-01-28 DIAGNOSIS — R1031 Right lower quadrant pain: Secondary | ICD-10-CM | POA: Insufficient documentation

## 2020-01-28 LAB — COMPREHENSIVE METABOLIC PANEL
ALT: 26 U/L (ref 0–44)
AST: 27 U/L (ref 15–41)
Albumin: 4.8 g/dL (ref 3.5–5.0)
Alkaline Phosphatase: 62 U/L (ref 38–126)
Anion gap: 15 (ref 5–15)
BUN: 16 mg/dL (ref 6–20)
CO2: 25 mmol/L (ref 22–32)
Calcium: 9.7 mg/dL (ref 8.9–10.3)
Chloride: 100 mmol/L (ref 98–111)
Creatinine, Ser: 1.17 mg/dL (ref 0.61–1.24)
GFR calc Af Amer: >60 mL/min (ref >60)
GFR calc non Af Amer: >60 mL/min (ref >60)
Glucose, Bld: 115 mg/dL — ABNORMAL HIGH (ref 70–99)
Potassium: 3.8 mmol/L (ref 3.5–5.1)
Sodium: 140 mmol/L (ref 135–145)
Total Bilirubin: 1.1 mg/dL (ref 0.3–1.2)
Total Protein: 8.7 g/dL — ABNORMAL HIGH (ref 6.5–8.1)

## 2020-01-28 LAB — CBC
HCT: 45.3 % (ref 39.0–52.0)
Hemoglobin: 14.9 g/dL (ref 13.0–17.0)
MCH: 28.7 pg (ref 26.0–34.0)
MCHC: 32.9 g/dL (ref 30.0–36.0)
MCV: 87.3 fL (ref 80.0–100.0)
Platelets: 235 10*3/uL (ref 150–400)
RBC: 5.19 MIL/uL (ref 4.22–5.81)
RDW: 15.5 % (ref 11.5–15.5)
WBC: 15 10*3/uL — ABNORMAL HIGH (ref 4.0–10.5)
nRBC: 0 % (ref 0.0–0.2)

## 2020-01-28 LAB — SARS CORONAVIRUS 2 BY RT PCR (HOSPITAL ORDER, PERFORMED IN ~~LOC~~ HOSPITAL LAB): SARS Coronavirus 2: NEGATIVE

## 2020-01-28 LAB — LIPASE, BLOOD: Lipase: 23 U/L (ref 11–51)

## 2020-01-28 LAB — LACTIC ACID, PLASMA: Lactic Acid, Venous: 1.6 mmol/L (ref 0.5–1.9)

## 2020-01-28 MED ORDER — ACETAMINOPHEN 325 MG PO TABS
ORAL_TABLET | ORAL | Status: AC
  Filled 2020-01-28: qty 2

## 2020-01-28 MED ORDER — ACETAMINOPHEN 325 MG PO TABS
650.0000 mg | ORAL_TABLET | Freq: Once | ORAL | Status: AC | PRN
  Administered 2020-01-28: 650 mg via ORAL

## 2020-01-28 NOTE — ED Triage Notes (Signed)
Pt c/o intermittent RLQ pain for the 3-4 weeks but has worsened in the past 24 hrs with nausea. Denies vomiting or diarrhea. Denies any cough or other sx.

## 2020-01-29 ENCOUNTER — Telehealth: Payer: Self-pay | Admitting: *Deleted

## 2020-01-29 NOTE — Telephone Encounter (Signed)
Pt notified of Dr. Royden Purl comments and verbalized understanding. Pt said he is only taking meds on med list and he hasn't had any recent steroids/ inj that he is aware of. Pt said he is still having some of the sxs he went to ER with and listed in prev message but no skin issues. When I asked about urinary issues he said he always has urinary urgency problems so he wouldn't know.   Virtual visit set up with Dr. Para March tomorrow since PCP and Dr. Milinda Antis are out of the office tomorrow.  FYI to Dr. Para March

## 2020-01-29 NOTE — Telephone Encounter (Signed)
Patient left a voicemail stating that he went to the ER yesterday. Patient stated that he is not able to get into my chart to get his test results. Patient wants to know if it is okay to go back to work tomorrow.  Spoke to patient and was advised that he went to see Dr. Ernest Pine yesterday and he was wheeled over to the ER at [facility]. Patient stated that while he was at Dr. Elenor Legato office he started feeling like he was going to pass out. Patient stated that he had a fever, sweats, chills, cough and was nauseated. Patient stated that Dr. Ernest Pine told him that something wasn't right and he was wheeled over to [facility] ER. Patient stated that he must have passed out because he woke up in the ER. Patient stated that they did some test on him. Patient stated after 7 hours of waiting he was feeling better so he went to the nurse and told her that he was leaving. Patient stated the nurse tested him for covid and he left. Patient stated that he would like the results of the test that were done at the ER. Patient stated that he still has a headache and weakness today. Patient denies SOB or difficulty breathing. Patient stated that he is feeling better today. 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  Dr. Patsy Lager is out of the office this week.

## 2020-01-29 NOTE — Telephone Encounter (Signed)
His covid test was negative fortunately.  I would not advise him to return to work until he feels 100% better (weakness/headache)  I noted in his ER labs that wbc was elevated (can be a sign of a bacterial infection) -has he had any steroids or a steroid shot recently because that could also inc his wbc Any urinary symptoms? Signs of a skin infection?  Glad he is feeling better  It would be wise to set up a virtual visit also to check in   I will cc Dr Ernest Pine as well as Lorain Childes

## 2020-01-29 NOTE — Telephone Encounter (Signed)
Noted.  Thanks.  Will talk to patient about this tomorrow.

## 2020-01-30 ENCOUNTER — Other Ambulatory Visit: Payer: Self-pay

## 2020-01-30 ENCOUNTER — Ambulatory Visit
Admission: RE | Admit: 2020-01-30 | Discharge: 2020-01-30 | Disposition: A | Discharge status: Home / Self Care | Payer: BC Managed Care – PPO | Source: Ambulatory Visit | Attending: Family Medicine | Admitting: Family Medicine

## 2020-01-30 ENCOUNTER — Encounter: Payer: Self-pay | Admitting: Family Medicine

## 2020-01-30 ENCOUNTER — Telehealth (INDEPENDENT_AMBULATORY_CARE_PROVIDER_SITE_OTHER): Payer: BC Managed Care – PPO | Admitting: Family Medicine

## 2020-01-30 ENCOUNTER — Other Ambulatory Visit: Payer: Self-pay | Admitting: Family Medicine

## 2020-01-30 VITALS — Ht 72.0 in | Wt 378.0 lb

## 2020-01-30 DIAGNOSIS — R55 Syncope and collapse: Secondary | ICD-10-CM | POA: Insufficient documentation

## 2020-01-30 DIAGNOSIS — M7989 Other specified soft tissue disorders: Secondary | ICD-10-CM

## 2020-01-30 MED ORDER — DOXYCYCLINE HYCLATE 100 MG PO TABS: 100.0000 mg | ORAL_TABLET | Freq: Two times a day (BID) | ORAL | 0 refills | Status: DC

## 2020-01-30 MED ORDER — IOHEXOL 350 MG/ML SOLN
125.0000 mL | Freq: Once | INTRAVENOUS | Status: AC | PRN
  Administered 2020-01-30: 125 mL via INTRAVENOUS

## 2020-01-30 NOTE — Progress Notes (Signed)
Virtual visit completed through WebEx or similar program Patient location: home  Provider location: [place] at [facility], office  Participants: Patient and me (unless stated otherwise below)  Pandemic considerations d/w pt.   Limitations and rationale for visit method d/w patient.  Patient agreed to proceed.   CC: follow up.    HPI:  He went to ortho clinic, Dr. Ernest Pine.  He was going to get fitted for a knee brace.  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  He had chills, was shivering.  This was clearly atypical for patient.  Sent to ER- per patient he doesn't recall all of the events- "I woke up in a wheelchair in the ER."  Presumed syncope.  He was in ER for about 7 hours.  He felt better at that point and left to go home.  He got labs done prior to leaving the ER.    He has swollen R calf noted today, more pain in the R calf today (esp inc in pain when calf is dependent).  This is a new issue.  R leg is clearly puffier than L leg.  R shin feels hot compared to L leg.    Per patient report he has about 1/2 inch bigger circumference on the R calf compared to the L calf.    No h/o syncope otherwise.  No h/o clotting disorder, no h/o DVT.  He doesn't feel lightheaded.  No CP.  No fevers or chills today.    Teaches health and PE.    Meds and allergies reviewed.   ROS: Per HPI unless specifically indicated in ROS section   NAD Speech wnl  A/P:  Several issues.  Episode of syncope yesterday, presenting for virtual visit today.  Additionally with abnormal findings on his right calf.  It could be that he has cellulitis and that would explain the chills and shivering yesterday.  He could have had an episode where his blood pressure was transiently low because of that and that would explain syncope.  However we still need to exclude DVT and pulmonary embolism given his leg symptoms and syncope, respectively.  Patient understood.  He was sent for  CTA that did not show any PE.  Ultrasound did not show DVT but it did show an enlarged lymph node which could be consistent with cellulitis.  We advised the patient to start taking antibiotics in the meantime and we will arrange phone follow-up.  He will update [place] as needed in the meantime and routine cautions were given to the patient.  I will defer to PCP about potential follow-up studies related to his enlarged lymph node.

## 2020-01-31 ENCOUNTER — Encounter: Payer: Self-pay | Admitting: Family Medicine

## 2020-02-03 ENCOUNTER — Telehealth: Payer: Self-pay | Admitting: Family Medicine

## 2020-02-03 NOTE — Telephone Encounter (Signed)
Please get update on patient on Tuesday.  I routed this note to PCP as FYI regarding follow-up.  Thanks.

## 2020-02-04 NOTE — Telephone Encounter (Signed)
Spoke with patient who says he is back at work but his foot and lower leg is really swollen and he would like to be re-evaluated ASAP.  Patient had a class at the time and says he will call back to get an appt when he is free.

## 2020-02-04 NOTE — Telephone Encounter (Signed)
Pt called back to try and get scheduled with [name] or Para March rather quickly. Would Para March be willing to work him in this week?

## 2020-02-04 NOTE — Telephone Encounter (Signed)
I was not involved with the management of his current medical problems.  He was evaluated and treated by Dr. Para March.  I will ask the patient to follow-up with me to reassess.

## 2020-02-04 NOTE — Telephone Encounter (Signed)
If worse, needs recheck whenever and wherever possible.  Please see who has availability today or tomorrow here at clinic vs recheck at [facility].    As a separate issue, I would like him to f/u with PCP at some point in the future after resolution of this episode to consider recheck of lymphadenopathy prev seen on imaging.    Thanks.

## 2020-02-05 NOTE — Telephone Encounter (Signed)
I have not seen him nor managed him for this condition.  He has been managed by my partner Dr. Para March.  I will see him the best that I can and attempt to arrange follow-up and request that he follow up over some time for reassessment of his lymph node.  Repeat of other notes in the chart.  I will try to see him to the best of my ability today.

## 2020-02-05 NOTE — Telephone Encounter (Signed)
I called the pt and and apologized for the delay in figuring out who and when he was going to get seen. I asked what he would like to do. He said 1st available. I put him in with Dr Para March on Friday at 2pm.

## 2020-02-07 ENCOUNTER — Ambulatory Visit
Admission: RE | Admit: 2020-02-07 | Discharge: 2020-02-07 | Disposition: A | Discharge status: Home / Self Care | Payer: BC Managed Care – PPO | Source: Ambulatory Visit | Attending: Family Medicine | Admitting: Family Medicine

## 2020-02-07 ENCOUNTER — Other Ambulatory Visit: Payer: Self-pay

## 2020-02-07 ENCOUNTER — Ambulatory Visit: Payer: BC Managed Care – PPO | Admitting: Family Medicine

## 2020-02-07 ENCOUNTER — Encounter: Payer: Self-pay | Admitting: Family Medicine

## 2020-02-07 DIAGNOSIS — R599 Enlarged lymph nodes, unspecified: Secondary | ICD-10-CM

## 2020-02-07 DIAGNOSIS — L0390 Cellulitis, unspecified: Secondary | ICD-10-CM

## 2020-02-07 MED ORDER — IOHEXOL 300 MG/ML  SOLN
125.0000 mL | Freq: Once | INTRAMUSCULAR | Status: AC | PRN
  Administered 2020-02-07: 125 mL via INTRAVENOUS

## 2020-02-07 MED ORDER — CEPHALEXIN 500 MG PO CAPS: 500.0000 mg | ORAL_CAPSULE | Freq: Three times a day (TID) | ORAL | 0 refills | Status: DC

## 2020-02-07 NOTE — Progress Notes (Signed)
This visit occurred during the SARS-CoV-2 public health emergency.  Safety protocols were in place, including screening questions prior to the visit, additional usage of staff PPE, and extensive cleaning of exam room while observing appropriate contact time as indicated for disinfecting solutions.  To summarize recent course he had chills and sweats before presenting to orthopedic clinic.  He had episode of syncope that led to emergency room evaluation.  He had labs drawn but the wait was long at the ER for full evaluation.  He felt well enough to leave.  We saw him in follow-up and when I was given that history we sent him for ultrasound of the leg and CT of the chest.  He did not have DVT or PE respectively.  We presume that he had cellulitis and started him on doxycycline.  He did have lymphadenopathy noted on the ultrasound.  Interval follow-up today.  Still on doxycycline.  No fevers.  No chills.  Still having right leg swelling which is slightly worse than previous  He isn't having chest pain.  No more syncope.  Still with throbbing pain in the R leg.  Other than the R leg sx, he feels okay.  Swelling is a little better early AM, then puffier later in the day.  He still has some redness on the right shin.  Meds, vitals, and allergies reviewed.   ROS: Per HPI unless specifically indicated in ROS section   GEN: nad, alert and oriented HEENT: ncat NECK: supple w/o LA, no lymphadenopathy CV: rrr. PULM: ctab, no inc wob ABD: soft, +bs EXT: R>L leg edema.  Right thigh 64 cm circumference.  Left thigh 61 cm circumference.  Right calf 49 cm circumference, left calf 40 cm circumference. SKIN: Erythema and hyperpigmentation noted on the right anterior shin without fluctuant mass.  Calf not tender.

## 2020-02-07 NOTE — Patient Instructions (Signed)
Add on keflex. Continue doxycycline.  Go to the lab on the way out.   If you have mychart we'll likely use that to update you.    Then see Nehemiah Settle about the CT scan on the way out.  Take care.  Glad to see you.

## 2020-02-09 DIAGNOSIS — L0390 Cellulitis, unspecified: Secondary | ICD-10-CM | POA: Insufficient documentation

## 2020-02-09 DIAGNOSIS — R599 Enlarged lymph nodes, unspecified: Secondary | ICD-10-CM | POA: Insufficient documentation

## 2020-02-09 NOTE — Assessment & Plan Note (Signed)
We talked about options.  He could have cellulitis that caused his other symptoms including the enlarged lymph nodes previously noted on imaging.  He could also have a separate issue in the abdomen and pelvis causing lymphadenopathy that then caused asymmetric swelling that resulted in cellulitis.  The difference was discussed with the patient.  Reasonable to get CT abdomen pelvis arranged and we will do so today.  See follow-up result note.  Given that he does not have clearly ominous findings in the abdomen, it makes sense to continue doxycycline and add on Keflex.  He will update [place] Monday.  If he is not improving then we will need to consider options for lymph node sampling.  At this point he is still okay for outpatient follow-up and we talked by phone after his CT was done and he understood the plan.  He can call the triage line in the meantime if needed.  Routed to PCP as FYI.  At least 30 minutes were devoted to patient care in this encounter (this can potentially include time spent reviewing the patient's file/history, interviewing and examining the patient, counseling/reviewing plan with patient, ordering referrals, ordering tests, reviewing relevant laboratory or x-ray data, and documenting the encounter).

## 2020-02-10 ENCOUNTER — Telehealth: Payer: Self-pay

## 2020-02-10 LAB — CBC WITH DIFFERENTIAL/PLATELET
Absolute Monocytes: 678 cells/uL (ref 200–950)
Basophils Absolute: 77 cells/uL (ref 0–200)
Basophils Relative: 1 %
Eosinophils Absolute: 270 cells/uL (ref 15–500)
Eosinophils Relative: 3.5 %
HCT: 42.5 % (ref 38.5–50.0)
Hemoglobin: 13.8 g/dL (ref 13.2–17.1)
Lymphs Abs: 2526 cells/uL (ref 850–3900)
MCH: 28 pg (ref 27.0–33.0)
MCHC: 32.5 g/dL (ref 32.0–36.0)
MCV: 86.4 fL (ref 80.0–100.0)
MPV: 10.4 fL (ref 7.5–12.5)
Monocytes Relative: 8.8 %
Neutro Abs: 4150 cells/uL (ref 1500–7800)
Neutrophils Relative %: 53.9 %
Platelets: 348 10*3/uL (ref 140–400)
RBC: 4.92 10*6/uL (ref 4.20–5.80)
RDW: 14.1 % (ref 11.0–15.0)
Total Lymphocyte: 32.8 %
WBC: 7.7 10*3/uL (ref 3.8–10.8)

## 2020-02-10 LAB — PATHOLOGIST SMEAR REVIEW

## 2020-02-10 MED ORDER — DOXYCYCLINE HYCLATE 100 MG PO TABS: 100.0000 mg | ORAL_TABLET | Freq: Two times a day (BID) | ORAL | 0 refills | Status: DC

## 2020-02-10 NOTE — Telephone Encounter (Signed)
Glad he is better.  If the addition of keflex seems to be the trigger for improvement, then I would continue both that and doxy.  I sent rx for extra doxy, so he can continue concurrent rx.  I would like to recheck him later in the week here at clinic.  If worse in the meantime, then let me know.  Would continue with elevation as much as he can.  Thanks.

## 2020-02-10 NOTE — Telephone Encounter (Signed)
Patient contacted the office wanting to give dr. Para March an update on his right leg. He states the swelling has went down significantly, but it is still very tender to the touch. He states it does not appear as red today also. Will route to Dr. Para March as Lorain Childes.

## 2020-02-10 NOTE — Addendum Note (Signed)
Addended by: Joaquim Nam on: 02/10/2020 02:08 PM   Modules accepted: Orders

## 2020-02-10 NOTE — Telephone Encounter (Signed)
Patient advised.

## 2020-02-13 ENCOUNTER — Encounter: Payer: Self-pay | Admitting: Family Medicine

## 2020-02-13 ENCOUNTER — Ambulatory Visit: Payer: BC Managed Care – PPO | Admitting: Family Medicine

## 2020-02-13 ENCOUNTER — Other Ambulatory Visit: Payer: Self-pay

## 2020-02-13 DIAGNOSIS — L0390 Cellulitis, unspecified: Secondary | ICD-10-CM

## 2020-02-13 MED ORDER — DOXYCYCLINE HYCLATE 100 MG PO TABS: 100.0000 mg | ORAL_TABLET | Freq: Two times a day (BID) | ORAL | 0 refills | Status: DC

## 2020-02-13 MED ORDER — CEPHALEXIN 500 MG PO CAPS: 500.0000 mg | ORAL_CAPSULE | Freq: Three times a day (TID) | ORAL | 0 refills | Status: DC

## 2020-02-13 NOTE — Patient Instructions (Addendum)
Your leg looks better overall.   Okay to use intermittent compression stocking.    I would continue the antibiotics for now.    Elevate as much as possible.    Let me know how you are doing early next week.    Take care.  Glad to see you.

## 2020-02-13 NOTE — Progress Notes (Signed)
This visit occurred during the SARS-CoV-2 public health emergency.  Safety protocols were in place, including screening questions prior to the visit, additional usage of staff PPE, and extensive cleaning of exam room while observing appropriate contact time as indicated for disinfecting solutions.  Previous episode of likely cellulitis discussed with patient.  Previous CT chest negative for PE.  Previous imaging noted with adenopathy but no tumor or mass otherwise.  Has been on keflex and doxycycline in the meantime.  No fevers.  Not lightheaded.  No syncope.  No cough.  No abd pain.  R shin is less tender but still ttp, less puffy overall.  More tender and puffy at the end of the day compared to early AM.  He is overall improved in the meantime.  R shin skin has different texture now.    Meds, vitals, and allergies reviewed.   ROS: Per HPI unless specifically indicated in ROS section   nad ncat rrr ctab abd soft, not ttp Right thigh 66 cm circumference, left thigh 65 cm circumference.  Right calf 47 cm circumference, left calf 45 cm circumference.  He has some mild local erythema on the right anterior shin with some chronic skin thickening on the right calf.  Distally he is neurovascular intact with intact dorsalis pedis pulse.  No fluctuant mass noted on the legs.

## 2020-02-16 NOTE — Assessment & Plan Note (Signed)
Discussed options.  He could have a primary cellulitis that caused lymphadenopathy or a primary issue at the lymph node that caused the swelling and cellulitis in the lower extremity.  Discussed. He is improved in the meantime on antibiotics  His leg looks better overall.  Okay to use intermittent compression stocking.   I would continue the antibiotics for now.   Elevate as much as possible.   I want him to update me about his situation early next week.  He agrees.

## 2020-03-19 ENCOUNTER — Other Ambulatory Visit: Payer: Self-pay

## 2020-03-19 ENCOUNTER — Encounter: Payer: Self-pay | Admitting: Family Medicine

## 2020-03-19 ENCOUNTER — Ambulatory Visit: Payer: BC Managed Care – PPO | Admitting: Family Medicine

## 2020-03-19 VITALS — BP 120/86 | HR 83 | Temp 98.3°F | Ht 72.0 in | Wt 366.2 lb

## 2020-03-19 DIAGNOSIS — R599 Enlarged lymph nodes, unspecified: Secondary | ICD-10-CM

## 2020-03-19 DIAGNOSIS — R935 Abnormal findings on diagnostic imaging of other abdominal regions, including retroperitoneum: Secondary | ICD-10-CM

## 2020-03-19 DIAGNOSIS — M2550 Pain in unspecified joint: Secondary | ICD-10-CM

## 2020-03-19 DIAGNOSIS — R6889 Other general symptoms and signs: Secondary | ICD-10-CM

## 2020-03-19 DIAGNOSIS — R6883 Chills (without fever): Secondary | ICD-10-CM | POA: Diagnosis not present

## 2020-03-19 DIAGNOSIS — R590 Localized enlarged lymph nodes: Secondary | ICD-10-CM | POA: Diagnosis not present

## 2020-03-19 NOTE — Progress Notes (Signed)
Steve Shostak T. Chee Kinslow, MD, CAQ Sports Medicine  Primary Care and Sports Medicine [facility] at [facility] [address] [place], [address]  Phone: [phone]  FAX: [phone]  Steve Ashley - 54 y.o. male  MRN [id number]  Date of Birth: April 07, 1966  Date: 03/19/2020  PCP: Hannah Beat, MD  Referral: Hannah Beat, MD  Chief Complaint  Patient presents with  . Follow-up    6 week    This visit occurred during the SARS-CoV-2 public health emergency.  Safety protocols were in place, including screening questions prior to the visit, additional usage of staff PPE, and extensive cleaning of exam room while observing appropriate contact time as indicated for disinfecting solutions.   Subjective:   Steve Ashley is a 54 y.o. very pleasant male patient with Body mass index is 49.67 kg/m. who presents with the following:  Follow-up abnormal CT with multiple enlarged lymph nodes in the inguinal region.  At that time, the patient did have some cellulitis and was treated with 2 different antibiotics with success.  At that time he did have quite a bit of enlargement in his right lower extremity.  He did have a negative ultrasound for DVT on the right side, but on his CT the abdomen and pelvis there were multiple enlarged lymph nodes including one that was 2 cm across.  This was all on the right inguinal chain.  I have been talking to the patient for about 15 minutes, and at that point he did bring up that 2 days prior he was cold, shivering, he felt awful as if he had the flu.  He had diffuse body aches and myalgias. Yesterday he did have flu-like symptoms. Was really hot, shivering, and had a really bad headache.  This started on Tuesday.  Did not go to football practice.  He felt so poorly, he did not go to school at all.  He did take some theraflu and pedialite.    Previously did have a true syncope, and the records are available for your  review. Went to get a check o nhis knee and was cold and shivering.  Went to dr. Ernest Pine office and he was getting cold.  Needed a blanket.  Woke up in the er.  Stayed for seven hours.  Still swollen but it does look better.   CT Abdomen Pelvis W Contrast  Result Date: 02/07/2020 CLINICAL DATA:  Patient reports enlarging right groin lymph node for 5 days. EXAM: CT ABDOMEN AND PELVIS WITH CONTRAST TECHNIQUE: Multidetector CT imaging of the abdomen and pelvis was performed using the standard protocol following bolus administration of intravenous contrast. CONTRAST:  OMNIPAQUE IOHEXOL 300 MG/ML  SOLN COMPARISON:  Noncontrast abdominal CT 07/20/2019 FINDINGS: Lower chest: Minor dependent atelectasis in the right lower lobe. No pleural fluid or confluent airspace disease. Hepatobiliary: No focal liver abnormality is seen. No gallstones, gallbladder wall thickening, or biliary dilatation. Pancreas: No ductal dilatation or inflammation. Spleen: Normal in size without focal abnormality. Adrenals/Urinary Tract: Normal adrenal glands. No hydronephrosis. No perinephric edema. Exophytic cyst arising from the lower right kidney measures 8.5 cm a not significantly changed allowing for differences in caliper placement. There are smaller cortical cysts within both kidneys as well as low-density lesions too small to characterize. Nonobstructing stone in the upper right kidney. No evidence of left renal calculi. Both ureters are decompressed. Urinary bladder completely nondistended and not well assessed. Stomach/Bowel: Stomach unremarkable. There is no small bowel obstruction or inflammation.  Administered enteric contrast reaches the colon. Normal appendix. Mild colonic tortuosity with moderate stool burden. No colonic wall thickening. No pericolonic edema. Mild distal diverticulosis without diverticulitis. There is no evidence of colonic mass. Vascular/Lymphatic: There is an enlarged right inguinal node with short axis  dimension of 2 cm, series 2, image 101. This is increased in size from prior exam. There is slight adjacent nodal edema. No evidence of nodal necrosis. There also two additional large right inguinal nodes measuring 15 mm and 20 mm, series 2, image 94 and series 2, image 91. Small left inguinal nodes are not enlarged by size criteria. There are small right external iliac nodes measuring up to 7 mm, not enlarged by size criteria and similar to prior exam. Small periceliac nodes measure up to 9 mm. No enlarged lymph nodes in the retroperitoneum or upper abdomen. Normal caliber abdominal aorta. Patent portal vein. No acute vascular findings. Reproductive: Prostatic calcifications.  Normal in size. Other: Fat containing left inguinal hernia. Fat containing umbilical hernia, as well as fat containing supraumbilical ventral abdominal wall hernia. No bowel inflammation of any of these hernias. There is no ascites or free fluid. No omental thickening or nodularity. There is a bilobed structure in the upper abdomen adjacent to the left or curvature of the stomach measuring 3.3 cm. This is peripheral soft tissue and central fat density. This is been stable from prior exam and is overall benign characteristics. There is no soft tissue or perineal air. Musculoskeletal: Degenerative change at L4-L5, degenerative disc disease and facet hypertrophy. Facet hypertrophy also at L5-S1. Degenerative change of both hips. There are no acute or suspicious osseous abnormalities. Musculature unremarkable. IMPRESSION: 1. Enlarged right inguinal lymph nodes, largest measuring 2 cm. There is slight adjacent nodal edema. Presence of nodal edema suggests lymphadenitis. Alternatively this may be reactive. Recommend clinical correlation for right lower extremity infection or focal abnormality. Recommend continued clinical follow-up. Should these persists clinically, nodal sampling could be considered. 2. No other enlarged lymph nodes throughout the  abdomen or pelvis to suggest lymphoproliferative disorder. 3. Fat containing left inguinal and supraumbilical ventral abdominal wall hernias. No bowel inflammation or obstruction. 4. Nonobstructing right renal stone. Bilateral renal cysts. 5. Mild distal colonic diverticulosis without diverticulitis. Electronically Signed   By: Narda Rutherford M.D.   On: 02/07/2020 18:26    Review of Systems is noted in the HPI, as appropriate  Objective:   BP 120/86   Pulse 83   Temp 98.3 F (36.8 C) (Temporal)   Ht 6' (1.829 m)   Wt (!) 366 lb 4 oz (166.1 kg)   SpO2 99%   BMI 49.67 kg/m   GEN: No acute distress; alert,appropriate. PULM: Normal respiratory rate, no accessory muscle use. No wheezes, crackles or rhonchi CV: RRR, no m/g/r  PSYCH: Normally interactive.  ABD: S, NT, ND, + BS, No rebound, No HSM  Right lower extremity does have 1+ pitting edema compared to trace edema on the left side.  Laboratory and Imaging Data: Results for orders placed or performed in visit on 03/19/20  Novel Coronavirus, NAA ([facility])   Specimen: Oropharyngeal(OP) collection in vial transport medium   Oropharyngea  Is this  Result Value Ref Range   SARS-CoV-2, NAA Not Detected Not Detected  SARS-COV-2, NAA 2 DAY TAT   Oropharyngea  Is this  Result Value Ref Range   SARS-CoV-2, NAA 2 DAY TAT Performed   Specimen status report  Result Value Ref Range   specimen status report Comment  Assessment and Plan:     ICD-10-CM   1. LAD (lymphadenopathy), inguinal  R59.0   2. Flu-like symptoms  R68.89 Novel Coronavirus, NAA ([facility])    Novel Coronavirus, NAA ([facility])  3. Chills  R68.83 Novel Coronavirus, NAA ([facility])    Novel Coronavirus, NAA ([facility])  4. Polyarthralgia  M25.50 Novel Coronavirus, NAA ([facility])    Novel Coronavirus, NAA ([facility])  5. Abnormal CT scan, pelvis  R93.5    Clinical concern for ongoing inguinal lymphadenopathy was a very large 2 cm inguinal lymph node seen on prior CT of  the abdomen and pelvis.  Repeat CT of the abdomen and pelvis with contrast to evaluate for ongoing enlarged lymph nodes in the inguinal chain.  Evaluate for potential neoplasm.  Unfortunately, he did have flulike symptoms, Covid symptoms when he was in the office unbeknownst to our office.  At that point when it became aware to me I did isolate the patient and we did obtain a COVID-19 test, at this point that has returned and it is negative.  No orders of the defined types were placed in this encounter.  Medications Discontinued During This Encounter  Medication Reason  . cephALEXin (KEFLEX) 500 MG capsule Completed Course  . doxycycline (VIBRA-TABS) 100 MG tablet Completed Course   Orders Placed This Encounter  Procedures  . Novel Coronavirus, NAA ([facility])  . SARS-COV-2, NAA 2 DAY TAT  . Specimen status report    Follow-up: No follow-ups on file.  Signed,  Elpidio Galea. Fynn Adel, MD   Outpatient Encounter Medications as of 03/19/2020  Medication Sig  . diclofenac (VOLTAREN) 75 MG EC tablet TAKE 1 TABLET BY MOUTH IN THE EVENING  . escitalopram (LEXAPRO) 20 MG tablet Take 1 tablet (20 mg total) by mouth daily.  . hydrochlorothiazide (HYDRODIURIL) 25 MG tablet Take 1 tablet (25 mg total) by mouth daily.  Marland Kitchen levothyroxine (SYNTHROID) 100 MCG tablet Take 1 tablet (100 mcg total) by mouth daily.  . metoprolol tartrate (LOPRESSOR) 25 MG tablet Take 1 tablet (25 mg total) by mouth 2 (two) times daily.  . rosuvastatin (CRESTOR) 40 MG tablet Take 1 tablet (40 mg total) by mouth daily at 6 PM.  . [DISCONTINUED] cephALEXin (KEFLEX) 500 MG capsule Take 1 capsule (500 mg total) by mouth 3 (three) times daily.  . [DISCONTINUED] diclofenac (VOLTAREN) 75 MG EC tablet Take 1 tablet (75 mg total) by mouth every evening.  . [DISCONTINUED] doxycycline (VIBRA-TABS) 100 MG tablet Take 1 tablet (100 mg total) by mouth 2 (two) times daily.   No facility-administered encounter medications on file as of  03/19/2020.

## 2020-03-20 ENCOUNTER — Encounter: Payer: Self-pay | Admitting: Family Medicine

## 2020-03-20 LAB — NOVEL CORONAVIRUS, NAA: SARS-CoV-2, NAA: NOT DETECTED

## 2020-03-20 LAB — SARS-COV-2, NAA 2 DAY TAT

## 2020-03-20 LAB — SPECIMEN STATUS REPORT

## 2020-04-03 ENCOUNTER — Other Ambulatory Visit: Payer: Self-pay

## 2020-04-03 ENCOUNTER — Ambulatory Visit
Admission: RE | Admit: 2020-04-03 | Discharge: 2020-04-03 | Disposition: A | Discharge status: Home / Self Care | Payer: BC Managed Care – PPO | Source: Ambulatory Visit | Attending: Family Medicine | Admitting: Family Medicine

## 2020-04-03 DIAGNOSIS — R935 Abnormal findings on diagnostic imaging of other abdominal regions, including retroperitoneum: Secondary | ICD-10-CM

## 2020-04-03 DIAGNOSIS — R590 Localized enlarged lymph nodes: Secondary | ICD-10-CM

## 2020-04-03 DIAGNOSIS — R599 Enlarged lymph nodes, unspecified: Secondary | ICD-10-CM

## 2020-04-03 MED ORDER — IOPAMIDOL (ISOVUE-300) INJECTION 61%
100.0000 mL | Freq: Once | INTRAVENOUS | Status: AC | PRN
  Administered 2020-04-03: 100 mL via INTRAVENOUS

## 2020-07-07 ENCOUNTER — Other Ambulatory Visit: Payer: Self-pay | Admitting: Family Medicine

## 2020-07-07 NOTE — Telephone Encounter (Signed)
Please schedule CPE with fasting labs prior with Dr. Patsy Lager.  Last office visit 03/19/2020 for LAD.  Diclofenac last refilled 12/27/2019 for #90 with 1 refill.  No future appointments.

## 2020-07-07 NOTE — Telephone Encounter (Signed)
Spoke with patient scheduled CPE and fasting lab

## 2020-07-08 ENCOUNTER — Other Ambulatory Visit: Payer: Self-pay | Admitting: Family Medicine

## 2020-07-27 ENCOUNTER — Other Ambulatory Visit: Payer: Self-pay

## 2020-07-27 ENCOUNTER — Other Ambulatory Visit (INDEPENDENT_AMBULATORY_CARE_PROVIDER_SITE_OTHER): Payer: BC Managed Care – PPO

## 2020-07-27 DIAGNOSIS — Z131 Encounter for screening for diabetes mellitus: Secondary | ICD-10-CM

## 2020-07-27 DIAGNOSIS — Z79899 Other long term (current) drug therapy: Secondary | ICD-10-CM | POA: Diagnosis not present

## 2020-07-27 DIAGNOSIS — E785 Hyperlipidemia, unspecified: Secondary | ICD-10-CM

## 2020-07-27 DIAGNOSIS — Z125 Encounter for screening for malignant neoplasm of prostate: Secondary | ICD-10-CM

## 2020-07-27 LAB — LIPID PANEL
Cholesterol: 160 mg/dL (ref 0–200)
HDL: 39.8 mg/dL (ref >39.00)
LDL Cholesterol: 94 mg/dL (ref 0–99)
NonHDL: 120.35
Total CHOL/HDL Ratio: 4
Triglycerides: 134 mg/dL (ref 0.0–149.0)
VLDL: 26.8 mg/dL (ref 0.0–40.0)

## 2020-07-27 LAB — CBC WITH DIFFERENTIAL/PLATELET
Basophils Absolute: 0.1 10*3/uL (ref 0.0–0.1)
Basophils Relative: 0.9 % (ref 0.0–3.0)
Eosinophils Absolute: 0.3 10*3/uL (ref 0.0–0.7)
Eosinophils Relative: 3.4 % (ref 0.0–5.0)
HCT: 44.5 % (ref 39.0–52.0)
Hemoglobin: 14.3 g/dL (ref 13.0–17.0)
Lymphocytes Relative: 37.1 % (ref 12.0–46.0)
Lymphs Abs: 2.7 10*3/uL (ref 0.7–4.0)
MCHC: 32.2 g/dL (ref 30.0–36.0)
MCV: 87.7 fl (ref 78.0–100.0)
Monocytes Absolute: 0.7 10*3/uL (ref 0.1–1.0)
Monocytes Relative: 10 % (ref 3.0–12.0)
Neutro Abs: 3.6 10*3/uL (ref 1.4–7.7)
Neutrophils Relative %: 48.6 % (ref 43.0–77.0)
Platelets: 258 10*3/uL (ref 150.0–400.0)
RBC: 5.07 Mil/uL (ref 4.22–5.81)
RDW: 15.8 % — ABNORMAL HIGH (ref 11.5–15.5)
WBC: 7.4 10*3/uL (ref 4.0–10.5)

## 2020-07-27 LAB — BASIC METABOLIC PANEL
BUN: 14 mg/dL (ref 6–23)
CO2: 30 mEq/L (ref 19–32)
Calcium: 9.7 mg/dL (ref 8.4–10.5)
Chloride: 103 mEq/L (ref 96–112)
Creatinine, Ser: 1 mg/dL (ref 0.40–1.50)
GFR: 84.54 mL/min (ref >60.00)
Glucose, Bld: 97 mg/dL (ref 70–99)
Potassium: 4.5 mEq/L (ref 3.5–5.1)
Sodium: 139 mEq/L (ref 135–145)

## 2020-07-27 LAB — HEPATIC FUNCTION PANEL
ALT: 24 U/L (ref 0–53)
AST: 22 U/L (ref 0–37)
Albumin: 4.2 g/dL (ref 3.5–5.2)
Alkaline Phosphatase: 61 U/L (ref 39–117)
Bilirubin, Direct: 0.1 mg/dL (ref 0.0–0.3)
Total Bilirubin: 0.6 mg/dL (ref 0.2–1.2)
Total Protein: 7.5 g/dL (ref 6.0–8.3)

## 2020-07-27 LAB — HEMOGLOBIN A1C: Hgb A1c MFr Bld: 6.2 % (ref 4.6–6.5)

## 2020-07-28 LAB — PSA, TOTAL WITH REFLEX TO PSA, FREE: PSA, Total: 1.1 ng/mL (ref <=4.0)

## 2020-08-03 ENCOUNTER — Ambulatory Visit (INDEPENDENT_AMBULATORY_CARE_PROVIDER_SITE_OTHER): Payer: BC Managed Care – PPO | Admitting: Family Medicine

## 2020-08-03 ENCOUNTER — Other Ambulatory Visit: Payer: Self-pay

## 2020-08-03 ENCOUNTER — Encounter: Payer: Self-pay | Admitting: Family Medicine

## 2020-08-03 VITALS — BP 120/86 | HR 72 | Temp 97.8°F | Ht 70.5 in | Wt 382.8 lb

## 2020-08-03 DIAGNOSIS — R590 Localized enlarged lymph nodes: Secondary | ICD-10-CM

## 2020-08-03 DIAGNOSIS — R935 Abnormal findings on diagnostic imaging of other abdominal regions, including retroperitoneum: Secondary | ICD-10-CM

## 2020-08-03 DIAGNOSIS — Z0000 Encounter for general adult medical examination without abnormal findings: Secondary | ICD-10-CM | POA: Diagnosis not present

## 2020-08-03 DIAGNOSIS — Z6841 Body Mass Index (BMI) 40.0 and over, adult: Secondary | ICD-10-CM

## 2020-08-03 NOTE — Progress Notes (Signed)
Steve Wafer T. Lucia Mccreadie, MD, CAQ Sports Medicine  Primary Care and Sports Medicine [facility] [address] [place], [id number]  Phone: [phone]  FAX: [phone]  Steve Ashley - 55 y.o. male  MRN [id number]  Date of Birth: 07/25/1965  Date: 08/03/2020  PCP: Hannah Beatopland, Haniel Fix, MD  Referral: Hannah Beatopland, Jeneen Doutt, MD  Chief Complaint  Patient presents with  . Annual Exam    This visit occurred during the SARS-CoV-2 public health emergency.  Safety protocols were in place, including screening questions prior to the visit, additional usage of staff PPE, and extensive cleaning of exam room while observing appropriate contact time as indicated for disinfecting solutions.   Patient Care Team: Hannah Beatopland, Raedyn Wenke, MD as PCP - General Subjective:   Steve Ashley is a 55 y.o. pleasant patient who presents with the following:  Preventative Health Maintenance Visit:  Health Maintenance Summary Reviewed and updated, unless pt declines services.  Tobacco History Reviewed. 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  Getting burned out  F/u groin ultrasound.  Covid #3  Had a groin flare up over christmas.  23rd to 26th. Felt some soreness over christmas. ?pmild inguinal lymph nodes on prior ct abd.  Health Maintenance  Topic Date Due  . COVID-19 Vaccine (3 - Booster for Pfizer series) 02/16/2020  . INFLUENZA VACCINE  08/27/2020 (Originally 12/29/2019)  . COLONOSCOPY (Pts [id number] Insurance coverage will need to be confirmed)  12/17/2024  . TETANUS/TDAP  10/01/2029  . Hepatitis C Screening  Completed  . HIV Screening  Completed  . HPV VACCINES  Aged Out   Immunization History  Administered Date(s) Administered  . PFIZER(Purple  Top)SARS-COV-2 Vaccination 07/26/2019, 08/16/2019  . Tdap 10/19/2015, 10/02/2019   Patient Active Problem List   Diagnosis Date Noted  . NSTEMI (non-ST elevated myocardial infarction) [facility](HCC)     Priority: High  . BMI 50.0-59.9, adult (HCC) 05/09/2013    Priority: Medium  . OSA (obstructive sleep apnea) 08/29/2012    Priority: Medium  . Hypothyroidism 01/06/2010    Priority: Medium  . Hyperlipidemia LDL goal <70 01/06/2010    Priority: Medium  . Enlarged lymph nodes 02/09/2020  . Accelerated hypertension 04/16/2018  . Depression, major, single episode, moderate (HCC) 03/09/2017  . Family history of prostate cancer 10/10/2013  . HALLUX RIGIDUS 07/20/2009    Past Medical History:  Diagnosis Date  . Ankle fracture yrs ago   left   . Arthritis   . Asthma     hx of childhood asthma, none current  . Hyperlipidemia   . Hypertension   . Hypothyroidism   . Labral tear of shoulder    rotator cuff and Labrum tear, Right  . Morbid obesity (HCC)   . Myocardial infarction (HCC)    per pt   . Sleep apnea    wears CPAP 5.0-15.0  . Syndesmotic ankle sprain yrs ago    Past Surgical History:  Procedure Laterality Date  . ANKLE SURGERY  age 55   left ankle   . BACK SURGERY    . COLONOSCOPY    . HERNIA REPAIR    . KNEE ARTHROSCOPY  yrs ago   right  . LEFT HEART CATH AND CORONARY ANGIOGRAPHY Left 04/17/2018   Procedure: LEFT HEART CATH  AND CORONARY ANGIOGRAPHY;  Surgeon: Marcina Millard, MD;  Location: [facility] INVASIVE CV LAB;  Service: Cardiovascular;  Laterality: Left;  . SHOULDER ARTHROSCOPY WITH SUBACROMIAL DECOMPRESSION Right 05/17/2018   Procedure: SHOULDER ARTHROSCOPY WITH SUBACROMIAL DECOMPRESSION;  Surgeon: Jones Broom, MD;  Location: [facility] OR;  Service: Orthopedics;  Laterality: Right;  . THUMB ARTHROSCOPY  age 48   left   . VENTRAL HERNIA REPAIR  12/08/2011   Procedure: LAPAROSCOPIC VENTRAL HERNIA;  Surgeon: Atilano Ina, MD,FACS;  Location: [facility] ORS;  Service: General;   Laterality: N/A;  . WISDOM TOOTH EXTRACTION      Family History  Problem Relation Age of Onset  . Hypertension Mother   . Cancer Father        prostate  . Heart disease Neg Hx   . Colon cancer Neg Hx   . Diabetes Neg Hx     Past Medical History, Surgical History, Social History, Family History, Problem List, Medications, and Allergies have been reviewed and updated if relevant.  Review of Systems: Pertinent positives are listed above.  Otherwise, a full 14 point review of systems has been done in full and it is negative except where it is noted positive.  Objective:   BP 120/86   Pulse 72   Temp 97.8 F (36.6 C) (Temporal)   Ht 5' 10.5" (1.791 m)   Wt (!) 382 lb 12 oz (173.6 kg)   SpO2 98%   BMI 54.14 kg/m  Ideal Body Weight: Weight in (lb) to have BMI = 25: 176.4  Ideal Body Weight: Weight in (lb) to have BMI = 25: 176.4 No exam data present Depression screen [facility] 2/9 10/12/2017 04/24/2017 03/09/2017 01/26/2017 12/15/2016  Decreased Interest 0 1 1 - 3  Down, Depressed, Hopeless 0 0 0 1 2  PHQ - 2 Score 0 1 1 1 5   Altered sleeping - 1 1 2 2   Tired, decreased energy - 1 2 2 2   Change in appetite - 1 1 1 2   Feeling bad or failure about yourself  - 1 1 1 2   Trouble concentrating - 0 1 1 0  Moving slowly or fidgety/restless - 0 1 1 0  Suicidal thoughts - 0 0 0 0  PHQ-9 Score - 5 8 9 13   Difficult doing work/chores - Not difficult at all Somewhat difficult Somewhat difficult -     GEN: well developed, well nourished, no acute distress Eyes: conjunctiva and lids normal, PERRLA, EOMI ENT: TM clear, nares clear, oral exam WNL Neck: supple, no lymphadenopathy, no thyromegaly, no JVD Pulm: clear to auscultation and percussion, respiratory effort normal CV: regular rate and rhythm, S1-S2, no murmur, rub or gallop, no bruits, peripheral pulses normal and symmetric, no cyanosis, clubbing, edema or varicosities GI: soft, non-tender; no hepatosplenomegaly, masses; active bowel  sounds all quadrants GU: deferred Lymph: no cervical, axillary or inguinal adenopathy MSK: gait normal, muscle tone and strength WNL, no joint swelling, effusions, discoloration, crepitus  SKIN: clear, good turgor, color WNL, no rashes, lesions, or ulcerations Neuro: normal mental status, normal strength, sensation, and motion Psych: alert; oriented to person, place and time, normally interactive and not anxious or depressed in appearance.  All labs reviewed with patient. Results for orders placed or performed in visit on 07/27/20  Lipid panel  Result Value Ref Range   Cholesterol 160 0 - 200 mg/dL   Triglycerides 0.0 - 149.0 mg/dL   HDL  mg/dL   VLDL 0.0 - mg/dL   LDL  Cholesterol 94 0 - 99 mg/dL   Total CHOL/HDL Ratio 4    NonHDL 120.35   Hepatic function panel  Result Value Ref Range   Total Bilirubin 0.6 0.2 - 1.2 mg/dL   Bilirubin, Direct 0.1 0.0 - 0.3 mg/dL   Alkaline Phosphatase 61 39 - 117 U/L   AST 22 0 - 37 U/L   ALT 24 0 - 53 U/L   Total Protein 7.5 6.0 - 8.3 g/dL   Albumin 4.2 3.5 - 5.2 g/dL  Basic metabolic panel  Result Value Ref Range   Sodium 139 135 - 145 mEq/L   Potassium 4.5 3.5 - 5.1 mEq/L   Chloride 103 96 - 112 mEq/L   CO2 30 19 - 32 mEq/L   Glucose, Bld 97 70 - 99 mg/dL   BUN 14 6 - 23 mg/dL   Creatinine, Ser 7.93 0.40 - 1.50 mg/dL   GFR 90.30 >09.23 mL/min   Calcium 9.7 8.4 - 10.5 mg/dL  CBC with Differential/Platelet  Result Value Ref Range   WBC 7.4 4.0 - 10.5 K/uL   RBC 5.07 4.22 - 5.81 Mil/uL   Hemoglobin 14.3 13.0 - 17.0 g/dL   HCT 30.0 76.2 - 26.3 %   MCV 87.7 78.0 - 100.0 fl   MCHC 32.2 30.0 - 36.0 g/dL   RDW 33.5 (H) 45.6 - 25.6 %   Platelets 258.0 150.0 - 400.0 K/uL   Neutrophils Relative % 48.6 43.0 - 77.0 %   Lymphocytes Relative 37.1 12.0 - 46.0 %   Monocytes Relative 10.0 3.0 - 12.0 %   Eosinophils Relative 3.4 0.0 - 5.0 %   Basophils Relative 0.9 0.0 - 3.0 %   Neutro Abs 3.6 1.4 - 7.7 K/uL   Lymphs  Abs 2.7 0.7 - 4.0 K/uL   Monocytes Absolute 0.7 0.1 - 1.0 K/uL   Eosinophils Absolute 0.3 0.0 - 0.7 K/uL   Basophils Absolute 0.1 0.0 - 0.1 K/uL  Hemoglobin A1c  Result Value Ref Range   Hgb A1c MFr Bld 6.2 4.6 - 6.5 %  PSA, Total with Reflex to PSA, Free  Result Value Ref Range   PSA, Total 1.1 < OR = 4.0 ng/mL   CLINICAL DATA:  55 year old male with lymphadenopathy.  EXAM: CT ABDOMEN AND PELVIS WITH CONTRAST  TECHNIQUE: Multidetector CT imaging of the abdomen and pelvis was performed using the standard protocol following bolus administration of intravenous contrast.  CONTRAST:  ISOVUE-300 IOPAMIDOL (ISOVUE-300) INJECTION 61%  COMPARISON:  CT abdomen pelvis dated 02/07/2020.  FINDINGS: Lower chest: Minimal bibasilar dependent atelectasis. The visualized lung bases are otherwise clear.  No intra-abdominal free air or free fluid.  Hepatobiliary: Probable fatty infiltration of the liver. No intrahepatic biliary ductal dilatation. The gallbladder is unremarkable.  Pancreas: Unremarkable. No pancreatic ductal dilatation or surrounding inflammatory changes.  Spleen: Normal in size without focal abnormality.  Adrenals/Urinary Tract: The adrenal glands unremarkable. There is a 7 mm nonobstructing stone in the interpolar right kidney as seen on the prior CT. There is no hydronephrosis on either side. There is symmetric enhancement and excretion of contrast by both kidneys. There is an 8.5 cm cyst in the inferior pole of the right kidney. Smaller bilateral renal hypodensities are too small to characterize. These however appears similar to prior CT. The visualized ureters appear unremarkable. The urinary bladder is collapsed.  Stomach/Bowel: There are scattered sigmoid diverticula without active inflammatory changes. There is no bowel obstruction or active inflammation. The appendix is normal.  Vascular/Lymphatic: The  abdominal aorta and IVC are  unremarkable. No intra-abdominal or retroperitoneal adenopathy. Mildly enlarged right inguinal lymph nodes measure up to 18 mm in short axis. Overall no significant interval change in the size of the right inguinal lymph nodes compared to the prior CT. These are likely reactive in nature. Clinical correlation is recommended. These lymph nodes can be better evaluated with ultrasound.  Reproductive: Dystrophic calcification of the prostate gland. The seminal vesicles are symmetric.  Other: Fat containing umbilical and supraumbilical hernias as seen previously. No inflammatory changes or fluid collection. No herniated bowel.  Musculoskeletal: Degenerative changes of the spine primarily at L4-5. No acute osseous pathology.  IMPRESSION: 1. No acute intra-abdominal or pelvic pathology. 2. Mildly enlarged right inguinal lymph nodes similar to prior CT, likely reactive in nature. Clinical correlation is recommended. These lymph nodes can be better evaluated with ultrasound. No new adenopathy. 3. A 7 mm nonobstructing right renal interpolar stone. No hydronephrosis. 4. Sigmoid diverticulosis. No bowel obstruction. Normal appendix.   Electronically Signed   By: Elgie Collard M.D.   On: 04/05/2020 12:01  Assessment and Plan:     ICD-10-CM   1. Healthcare maintenance  Z00.00   2. BMI 50.0-59.9, adult (HCC)  Z68.43   3. Localized enlarged lymph nodes  R59.0 US SOFT TISSUE LOWER EXTREMITY LIMITED RIGHT (NON-VASCULAR)   Globally, he is doing pretty well.  Primarily, want him to work on his weight.  He has had 2 separate CTs with some enlarged right-sided lymph nodes, so in follow-up obtain a right-sided nonvascular ultrasound attention to the groin to rule out any potentially enlarged lymph nodes.  Clinical concern with enlarged lymph nodes would be potential neoplasm in a worse case scenario.  Health Maintenance Exam: The patient's preventative maintenance and recommended  screening tests for an annual wellness exam were reviewed in full today. Brought up to date unless services declined.  Counselled on the importance of diet, exercise, and its role in overall health and mortality. The patient's FH and SH was reviewed, including their home life, tobacco status, and drug and alcohol status.  Follow-up in 1 year for physical exam or additional follow-up below.  Follow-up: No follow-ups on file. Or follow-up in 1 year if not noted.  No orders of the defined types were placed in this encounter.  There are no discontinued medications. Orders Placed This Encounter  Procedures  . US SOFT TISSUE LOWER EXTREMITY LIMITED RIGHT (NON-VASCULAR)    Signed,  Shakiya Mcneary T. Kamani Lewter, MD   Allergies as of 08/03/2020      Reactions   Latex Itching      Medication List       Accurate as of August 03, 2020 10:41 AM. If you have any questions, ask your nurse or doctor.        diclofenac 75 MG EC tablet Commonly known as: VOLTAREN TAKE 1 TABLET BY MOUTH IN THE EVENING   escitalopram 20 MG tablet Commonly known as: LEXAPRO TAKE 1 TABLET BY MOUTH EVERY DAY   hydrochlorothiazide 25 MG tablet Commonly known as: HYDRODIURIL TAKE 1 TABLET BY MOUTH EVERY DAY   levothyroxine 100 MCG tablet Commonly known as: SYNTHROID TAKE 1 TABLET BY MOUTH EVERY DAY   metoprolol tartrate 25 MG tablet Commonly known as: LOPRESSOR TAKE 1 TABLET BY MOUTH TWICE A DAY   rosuvastatin 40 MG tablet Commonly known as: CRESTOR TAKE 1 TABLET BY MOUTH DAILY AT 6 PM

## 2020-08-04 NOTE — Addendum Note (Signed)
Addended by: Hannah Beat on: 08/04/2020 10:10 AM   Modules accepted: Orders

## 2020-08-07 ENCOUNTER — Ambulatory Visit
Admission: RE | Admit: 2020-08-07 | Discharge: 2020-08-07 | Disposition: A | Discharge status: Home / Self Care | Payer: BC Managed Care – PPO | Source: Ambulatory Visit | Attending: Family Medicine | Admitting: Family Medicine

## 2020-08-07 DIAGNOSIS — R935 Abnormal findings on diagnostic imaging of other abdominal regions, including retroperitoneum: Secondary | ICD-10-CM

## 2020-08-07 DIAGNOSIS — R590 Localized enlarged lymph nodes: Secondary | ICD-10-CM

## 2020-10-09 ENCOUNTER — Other Ambulatory Visit: Payer: Self-pay | Admitting: Family Medicine

## 2020-10-13 ENCOUNTER — Other Ambulatory Visit: Payer: Self-pay | Admitting: Family Medicine

## 2020-10-14 MED ORDER — LEVOTHYROXINE SODIUM 100 MCG PO TABS
100.0000 ug | ORAL_TABLET | Freq: Every day | ORAL | 1 refills | Status: DC
Start: 2020-10-14 — End: 2021-09-28

## 2020-10-14 NOTE — Telephone Encounter (Signed)
Pharmacy requests refill on: Hydrochlorothiazide 25 mg, Diclofenac 75 mg, Escitalopram 20 mg, Levothyroxine 100 mcg   LAST REFILL: 07/07/2020 (Q-90, R-0) LAST OV: 08/03/2020 (Q-90, R-0) NEXT OV: Not Scheduled  PHARMACY: [id number] [place], [place] TSH (04/04/2019): 11.37  Pharmacy requests refill on: Metoprolol Tartrate 25 mg   LAST REFILL: 07/07/2020 (Q-180, R-0) LAST OV: 08/03/2020 NEXT OV: Not Scheduled  PHARMACY: [id number] [place], [place]

## 2020-10-20 ENCOUNTER — Other Ambulatory Visit: Payer: Self-pay

## 2020-10-20 ENCOUNTER — Encounter: Payer: Self-pay | Admitting: Emergency Medicine

## 2020-10-20 ENCOUNTER — Emergency Department: Payer: No Typology Code available for payment source

## 2020-10-20 ENCOUNTER — Emergency Department
Admission: EM | Admit: 2020-10-20 | Discharge: 2020-10-20 | Disposition: A | Discharge status: Home / Self Care | Payer: No Typology Code available for payment source | Attending: Emergency Medicine | Admitting: Emergency Medicine

## 2020-10-20 DIAGNOSIS — S42124A Nondisplaced fracture of acromial process, right shoulder, initial encounter for closed fracture: Secondary | ICD-10-CM | POA: Diagnosis not present

## 2020-10-20 DIAGNOSIS — J45909 Unspecified asthma, uncomplicated: Secondary | ICD-10-CM | POA: Diagnosis not present

## 2020-10-20 DIAGNOSIS — Z91040 Latex allergy status: Secondary | ICD-10-CM | POA: Diagnosis not present

## 2020-10-20 DIAGNOSIS — E039 Hypothyroidism, unspecified: Secondary | ICD-10-CM | POA: Insufficient documentation

## 2020-10-20 DIAGNOSIS — S2231XA Fracture of one rib, right side, initial encounter for closed fracture: Secondary | ICD-10-CM | POA: Insufficient documentation

## 2020-10-20 DIAGNOSIS — S4991XA Unspecified injury of right shoulder and upper arm, initial encounter: Secondary | ICD-10-CM | POA: Diagnosis present

## 2020-10-20 DIAGNOSIS — S42034A Nondisplaced fracture of lateral end of right clavicle, initial encounter for closed fracture: Secondary | ICD-10-CM

## 2020-10-20 DIAGNOSIS — Z79899 Other long term (current) drug therapy: Secondary | ICD-10-CM | POA: Diagnosis not present

## 2020-10-20 DIAGNOSIS — W010XXA Fall on same level from slipping, tripping and stumbling without subsequent striking against object, initial encounter: Secondary | ICD-10-CM | POA: Diagnosis not present

## 2020-10-20 DIAGNOSIS — I10 Essential (primary) hypertension: Secondary | ICD-10-CM | POA: Insufficient documentation

## 2020-10-20 MED ORDER — ORPHENADRINE CITRATE 30 MG/ML IJ SOLN
60.0000 mg | Freq: Two times a day (BID) | INTRAMUSCULAR | Status: DC
  Administered 2020-10-20: 60 mg via INTRAMUSCULAR
  Filled 2020-10-20: qty 2

## 2020-10-20 MED ORDER — LIDOCAINE 5 % EX PTCH
1.0000 | MEDICATED_PATCH | CUTANEOUS | Status: DC
  Administered 2020-10-20: 1 via TRANSDERMAL
  Filled 2020-10-20: qty 1

## 2020-10-20 MED ORDER — OXYCODONE-ACETAMINOPHEN 7.5-325 MG PO TABS
1.0000 | ORAL_TABLET | Freq: Four times a day (QID) | ORAL | 0 refills | Status: AC | PRN
Start: 2020-10-20 — End: 2020-10-25

## 2020-10-20 MED ORDER — KETOROLAC TROMETHAMINE 60 MG/2ML IM SOLN
60.0000 mg | Freq: Once | INTRAMUSCULAR | Status: AC
  Administered 2020-10-20: 60 mg via INTRAMUSCULAR
  Filled 2020-10-20: qty 2

## 2020-10-20 MED ORDER — LIDOCAINE 5 % EX PTCH: 1.0000 | MEDICATED_PATCH | Freq: Two times a day (BID) | CUTANEOUS | 0 refills | Status: AC

## 2020-10-20 MED ORDER — HYDROMORPHONE HCL 1 MG/ML IJ SOLN
1.0000 mg | Freq: Once | INTRAMUSCULAR | Status: AC
  Administered 2020-10-20: 1 mg via INTRAMUSCULAR
  Filled 2020-10-20: qty 1

## 2020-10-20 NOTE — ED Provider Notes (Signed)
[facility] Emergency Department Provider Note   ____________________________________________   Event Date/Time   First MD Initiated Contact with Patient 10/20/20 [phone]     (approximate)  I have reviewed the triage vital signs and the nursing notes.   HISTORY  Chief Complaint Fall (/)    HPI Steve Ashley is a 55 y.o. male patient arrived via EMS with right shoulder and right rib pain secondary to a slip and fall prior to arrival.  Patient denies LOC or head injury.  Patient denies neck or back pain.  Patient denies abdominal pain.  Patient denies lower extremity pain.         Past Medical History:  Diagnosis Date  . Ankle fracture yrs ago   left   . Arthritis   . Asthma     hx of childhood asthma, none current  . Hyperlipidemia   . Hypertension   . Hypothyroidism   . Labral tear of shoulder    rotator cuff and Labrum tear, Right  . Morbid obesity (HCC)   . Myocardial infarction (HCC)    per pt   . Sleep apnea    wears CPAP 5.0-15.0  . Syndesmotic ankle sprain yrs ago    Patient Active Problem List   Diagnosis Date Noted  . Enlarged lymph nodes 02/09/2020  . Accelerated hypertension 04/16/2018  . NSTEMI (non-ST elevated myocardial infarction) (HCC)   . Depression, major, single episode, moderate (HCC) 03/09/2017  . Family history of prostate cancer 10/10/2013  . BMI 50.0-59.9, adult (HCC) 05/09/2013  . OSA (obstructive sleep apnea) 08/29/2012  . Hypothyroidism 01/06/2010  . Hyperlipidemia LDL goal <70 01/06/2010  . HALLUX RIGIDUS 07/20/2009    Past Surgical History:  Procedure Laterality Date  . ANKLE SURGERY  age 49   left ankle   . BACK SURGERY    . COLONOSCOPY    . HERNIA REPAIR    . KNEE ARTHROSCOPY  yrs ago   right  . LEFT HEART CATH AND CORONARY ANGIOGRAPHY Left 04/17/2018   Procedure: LEFT HEART CATH AND CORONARY ANGIOGRAPHY;  Surgeon: Marcina Millard, MD;  Location: [facility] INVASIVE CV LAB;  Service:  Cardiovascular;  Laterality: Left;  . SHOULDER ARTHROSCOPY WITH SUBACROMIAL DECOMPRESSION Right 05/17/2018   Procedure: SHOULDER ARTHROSCOPY WITH SUBACROMIAL DECOMPRESSION;  Surgeon: Jones Broom, MD;  Location: [facility] OR;  Service: Orthopedics;  Laterality: Right;  . THUMB ARTHROSCOPY  age 11   left   . VENTRAL HERNIA REPAIR  12/08/2011   Procedure: LAPAROSCOPIC VENTRAL HERNIA;  Surgeon: Atilano Ina, MD,FACS;  Location: [facility] ORS;  Service: General;  Laterality: N/A;  . WISDOM TOOTH EXTRACTION      Prior to Admission medications   Medication Sig Start Date End Date Taking? Authorizing Provider  lidocaine (LIDODERM) 5 % Place 1 patch onto the skin every 12 (twelve) hours. Remove & Discard patch within 12 hours or as directed by MD 10/20/20 10/20/21 Yes Joni Reining, PA-C  oxyCODONE-acetaminophen (PERCOCET) 7.5-325 MG tablet Take 1 tablet by mouth every 6 (six) hours as needed for up to 5 days for severe pain. 10/20/20 10/25/20 Yes Joni Reining, PA-C  diclofenac (VOLTAREN) 75 MG EC tablet TAKE 1 TABLET BY MOUTH EVERY DAY IN THE EVENING 10/14/20   [name], Karleen Hampshire, MD  escitalopram (LEXAPRO) 20 MG tablet TAKE 1 TABLET BY MOUTH EVERY DAY 10/14/20   [name], Karleen Hampshire, MD  hydrochlorothiazide (HYDRODIURIL) 25 MG tablet TAKE 1 TABLET BY MOUTH EVERY DAY 10/14/20   Hannah Beat, MD  levothyroxine (SYNTHROID) 100 MCG tablet Take 1 tablet (100 mcg total) by mouth daily. 10/14/20   [name], Karleen Hampshire, MD  metoprolol tartrate (LOPRESSOR) 25 MG tablet TAKE 1 TABLET BY MOUTH TWICE A DAY 10/14/20   [name], Karleen Hampshire, MD  rosuvastatin (CRESTOR) 40 MG tablet TAKE 1 TABLET BY MOUTH DAILY AT 6 PM 10/09/20   [name], Karleen Hampshire, MD    Allergies Latex  Family History  Problem Relation Age of Onset  . Hypertension Mother   . Cancer Father        prostate  . Heart disease Neg Hx   . Colon cancer Neg Hx   . Diabetes Neg Hx     Social History Social History   Tobacco Use  . Smoking status: Never Smoker  .  Smokeless tobacco: Never Used  Vaping Use  . Vaping Use: Never used  Substance Use Topics  . Alcohol use: Yes    Alcohol/week: 0.0 standard drinks    Comment: occassional; once monthly  . Drug use: Never    Review of Systems Constitutional: No fever/chills Eyes: No visual changes. ENT: No sore throat. Cardiovascular: Denies chest pain. Respiratory: Denies shortness of breath. Gastrointestinal: No abdominal pain.  No nausea, no vomiting.  No diarrhea.  No constipation. Genitourinary: Negative for dysuria. Musculoskeletal: Right shoulder and right rib pain Skin: Negative for rash. Neurological: Negative for headaches, focal weakness or numbness. Psychiatric:  Depression Endocrine:  Hyperlipidemia, hypertension, hypothyroidism. Allergic/Immunilogical: Latex ____________________________________________   PHYSICAL EXAM:  VITAL SIGNS: ED Triage Vitals  Enc Vitals Group     BP      Pulse      Resp      Temp      Temp src      SpO2      Weight      Height      Head Circumference      Peak Flow      Pain Score      Pain Loc      Pain Edu?      Excl. in GC?     Constitutional: Alert and oriented. Well appearing and in no acute distress.  BMI 54.14 Eyes: Conjunctivae are normal. PERRL. EOMI. Head: Atraumatic. Nose: No congestion/rhinnorhea. Mouth/Throat: Mucous membranes are moist.  Oropharynx non-erythematous. Neck: No stridor.  No cervical spine tenderness to palpation. Cardiovascular: Normal rate, regular rhythm. Grossly normal heart sounds.  Good peripheral circulation. Respiratory: Normal respiratory effort.  No retractions. Lungs CTAB. Gastrointestinal: Soft and nontender. No distention. No abdominal bruits. No CVA tenderness. Genitourinary: Deferred Musculoskeletal: No obvious deformity of the right shoulder.  Patient has moderate guarding palpation distal clavicle.  Patient decreased range of motion limited by complaint of pain of the right shoulder.  No obvious  chest wall deformity.  Patient is moderate guarding palpation of right ribs 4 through 6.  No lower extremity tenderness nor edema.  No joint effusions. Neurologic:  Normal speech and language. No gross focal neurologic deficits are appreciated. No gait instability. Skin:  Skin is warm, dry and intact. No rash noted.  No abrasion, ecchymosis, or erythema. Psychiatric: Mood and affect are normal. Speech and behavior are normal.  ____________________________________________   LABS (all labs ordered are listed, but only abnormal results are displayed)  Labs Reviewed - No data to display ____________________________________________  EKG   ____________________________________________  RADIOLOGY I, Joni Reining, personally viewed and evaluated these images (plain radiographs) as part of my medical decision making, as well as reviewing the written report  by the radiologist.  ED MD interpretation: Small mild acromion fracture.  Right sixth rib fracture Official radiology report(s): DG Ribs Unilateral W/Chest Right  Result Date: 10/20/2020 CLINICAL DATA:  Recent fall with right-sided chest pain, initial encounter EXAM: RIGHT RIBS AND CHEST - 3+ VIEW COMPARISON:  None. FINDINGS: Minimally displaced right sixth rib fracture is noted anterior laterally. No underlying pneumothorax is seen. No other rib fractures are noted. IMPRESSION: Mildly displaced right sixth rib fracture. No other focal abnormality is noted. Electronically Signed   By: Alcide Clever M.D.   On: 10/20/2020 10:28   DG Shoulder Right  Result Date: 10/20/2020 CLINICAL DATA:  Recent fall with shoulder pain, initial encounter EXAM: RIGHT SHOULDER - 2+ VIEW COMPARISON:  None. FINDINGS: Humeral head is well seated. Degenerative changes of the glenohumeral joint are seen. Mild degenerative changes of the acromioclavicular joint are seen as well. There are changes consistent with a fracture through the acromion adjacent to the  acromioclavicular joint. IMPRESSION: Acromial fracture with mild displacement. Degenerative changes about the shoulder joint. No other focal abnormality is seen. Electronically Signed   By: Alcide Clever M.D.   On: 10/20/2020 10:27    ____________________________________________   PROCEDURES  Procedure(s) performed (including Critical Care):  Procedures   ____________________________________________   INITIAL IMPRESSION / ASSESSMENT AND PLAN / ED COURSE  As part of my medical decision making, I reviewed the following data within the electronic MEDICAL RECORD NUMBER         Patient complaining of right shoulder pain and right rib pain secondary to a slip and fall.  Discussed x-ray findings with patient.  Patient placed in a sling and given a consult orthopedics.  Patient given discharge care instruction for rib fractures.  Advised on drug effects of pain medications.      ____________________________________________   FINAL CLINICAL IMPRESSION(S) / ED DIAGNOSES  Final diagnoses:  Closed nondisplaced fracture of acromial end of right clavicle, initial encounter  Closed fracture of one rib of right side, initial encounter     ED Discharge Orders         Ordered    oxyCODONE-acetaminophen (PERCOCET) 7.5-325 MG tablet  Every 6 hours PRN        10/20/20 1055    lidocaine (LIDODERM) 5 %  Every 12 hours        10/20/20 1055          *Please note:  Steve Ashley was evaluated in Emergency Department on 10/20/2020 for the symptoms described in the history of present illness. He was evaluated in the context of the global COVID-19 pandemic, which necessitated consideration that the patient might be at risk for infection with the SARS-CoV-2 virus that causes COVID-19. Institutional protocols and algorithms that pertain to the evaluation of patients at risk for COVID-19 are in a state of rapid change based on information released by regulatory bodies including the CDC and federal and  state organizations. These policies and algorithms were followed during the patient's care in the ED.  Some ED evaluations and interventions may be delayed as a result of limited staffing during and the pandemic.*   Note:  This document was prepared using Dragon voice recognition software and may include unintentional dictation errors.    Joni Reining, PA-C 10/20/20 1058    Sharman Cheek, MD 10/20/20 1515

## 2020-10-20 NOTE — ED Triage Notes (Signed)
Presents via EMS s/p fall  States he slipped on wet floor   Having pain to right lateral rib area and shoulder

## 2020-10-20 NOTE — Discharge Instructions (Signed)
Read and follow discharge care instruction.  Wear arm sling until evaluation by orthopedics.  Call the clinic listed on your discharge care instruction to schedule an appointment for your shoulder fracture.

## 2021-01-21 ENCOUNTER — Telehealth: Payer: Self-pay

## 2021-01-21 NOTE — Telephone Encounter (Signed)
[facility] Day - Client TELEPHONE ADVICE RECORD [facility]urse Patient Name: Steve Ashley [facility] Gender: Male DOB: [birth year] Age: 55 Y 3 M 21 D Return Phone Number: [phone] (Primary), [phone] (Secondary) Address: [place] [address] Client [facility] Day - Client Client Site [facility] - Day Physician [name], Karleen Hampshire - MD Contact Type Call Who Is Calling Patient / Member / Family / Caregiver Call Type Triage / Clinical Relationship To Patient Self Return Phone Number [phone] (Primary) Chief Complaint Blood In Stool Reason for Call Symptomatic / Request for Health Information Initial Comment Caller states she is experiencing a bloody stool. Translation No Nurse Assessment Nurse: Chilton Si, RN, Tanika Date/Time (Eastern Time): 01/21/2021 10:06:54 AM Confirm and document reason for call. If symptomatic, describe symptoms. ---Caller states she is experiencing a bloody stool. Noticed today. large amount. Does the patient have any new or worsening symptoms? ---Yes Will a triage be completed? ---Yes Related visit to physician within the last 2 weeks? ---No Does the PT have any chronic conditions? (i.e. diabetes, asthma, this includes High risk factors for pregnancy, etc.) ---No Is this a behavioral health or substance abuse call? ---No Guidelines Guideline Title Affirmed Question Affirmed Notes Nurse Date/Time Lamount Cohen Time) Rectal Bleeding [1] MODERATE rectal bleeding (small blood clots, passing blood without stool, or toilet water turns red) AND [2] more than once a day Chilton Si, RN, Tanika 01/21/2021 10:08:21 AM Disp. Time Lamount Cohen Time) Disposition Final User 01/21/2021 10:10:55 AM Go to ED Now Yes Chilton Si, RN, Alcario Drought PLEASE NOTE: All timestamps contained within this report are represented as [place] Standard Time. CONFIDENTIALTY NOTICE: This fax transmission is  intended only for the addressee. It contains information that is legally privileged, confidential or otherwise protected from use or disclosure. If you are not the intended recipient, you are strictly prohibited from reviewing, disclosing, copying using or disseminating any of this information or taking any action in reliance on or regarding this information. If you have received this fax in error, please notify us immediately by telephone so that we can arrange for its return to [place]. Phone: [phone], Toll-Free: [phone], Fax: [phone] Page: 2 of 2 Call Id: [id number] Caller Disagree/Comply Comply Caller Understands Yes PreDisposition Did not know what to do Care Advice Given Per Guideline GO TO ED NOW: CARE ADVICE given per Rectal Bleeding (Adult) guideline. Referrals [facility] - ED

## 2021-01-21 NOTE — Telephone Encounter (Signed)
I spoke with pt and he was being called back to [facility] ED as we spoke. Sending note to Dr Patsy Lager and Lupita Leash CMA.

## 2021-06-22 ENCOUNTER — Other Ambulatory Visit: Payer: Self-pay | Admitting: Family Medicine

## 2021-06-23 NOTE — Telephone Encounter (Signed)
Last office visit 08/03/2020 for CPE.  Last refilled 10/14/2020 for #90 with 1 refill.  No future appointments.

## 2021-08-02 ENCOUNTER — Other Ambulatory Visit: Payer: Self-pay | Admitting: Family Medicine

## 2021-08-02 DIAGNOSIS — Z131 Encounter for screening for diabetes mellitus: Secondary | ICD-10-CM

## 2021-08-02 DIAGNOSIS — E039 Hypothyroidism, unspecified: Secondary | ICD-10-CM

## 2021-08-02 DIAGNOSIS — E785 Hyperlipidemia, unspecified: Secondary | ICD-10-CM

## 2021-08-02 DIAGNOSIS — Z125 Encounter for screening for malignant neoplasm of prostate: Secondary | ICD-10-CM

## 2021-08-02 DIAGNOSIS — Z79899 Other long term (current) drug therapy: Secondary | ICD-10-CM

## 2021-08-09 ENCOUNTER — Other Ambulatory Visit: Payer: BC Managed Care – PPO

## 2021-08-19 ENCOUNTER — Ambulatory Visit (INDEPENDENT_AMBULATORY_CARE_PROVIDER_SITE_OTHER): Payer: BC Managed Care – PPO | Admitting: Family Medicine

## 2021-08-19 ENCOUNTER — Encounter: Payer: Self-pay | Admitting: Family Medicine

## 2021-08-19 ENCOUNTER — Other Ambulatory Visit: Payer: Self-pay

## 2021-08-19 VITALS — BP 110/90 | HR 85 | Temp 98.2°F | Ht 70.5 in | Wt 381.0 lb

## 2021-08-19 DIAGNOSIS — I2510 Atherosclerotic heart disease of native coronary artery without angina pectoris: Secondary | ICD-10-CM | POA: Diagnosis not present

## 2021-08-19 DIAGNOSIS — Z23 Encounter for immunization: Secondary | ICD-10-CM

## 2021-08-19 DIAGNOSIS — R21 Rash and other nonspecific skin eruption: Secondary | ICD-10-CM

## 2021-08-19 DIAGNOSIS — R7303 Prediabetes: Secondary | ICD-10-CM

## 2021-08-19 DIAGNOSIS — E039 Hypothyroidism, unspecified: Secondary | ICD-10-CM

## 2021-08-19 DIAGNOSIS — Z79899 Other long term (current) drug therapy: Secondary | ICD-10-CM | POA: Diagnosis not present

## 2021-08-19 DIAGNOSIS — Z6841 Body Mass Index (BMI) 40.0 and over, adult: Secondary | ICD-10-CM

## 2021-08-19 DIAGNOSIS — Z125 Encounter for screening for malignant neoplasm of prostate: Secondary | ICD-10-CM | POA: Diagnosis not present

## 2021-08-19 DIAGNOSIS — Z131 Encounter for screening for diabetes mellitus: Secondary | ICD-10-CM

## 2021-08-19 DIAGNOSIS — E785 Hyperlipidemia, unspecified: Secondary | ICD-10-CM

## 2021-08-19 DIAGNOSIS — Z0001 Encounter for general adult medical examination with abnormal findings: Secondary | ICD-10-CM

## 2021-08-19 LAB — BASIC METABOLIC PANEL
BUN: 14 mg/dL (ref 6–23)
CO2: 31 mEq/L (ref 19–32)
Calcium: 9.7 mg/dL (ref 8.4–10.5)
Chloride: 103 mEq/L (ref 96–112)
Creatinine, Ser: 1.07 mg/dL (ref 0.40–1.50)
GFR: 77.37 mL/min (ref >60.00)
Glucose, Bld: 94 mg/dL (ref 70–99)
Potassium: 4.2 mEq/L (ref 3.5–5.1)
Sodium: 141 mEq/L (ref 135–145)

## 2021-08-19 LAB — CBC WITH DIFFERENTIAL/PLATELET
Basophils Absolute: 0.1 10*3/uL (ref 0.0–0.1)
Basophils Relative: 0.9 % (ref 0.0–3.0)
Eosinophils Absolute: 0.1 10*3/uL (ref 0.0–0.7)
Eosinophils Relative: 2 % (ref 0.0–5.0)
HCT: 44.7 % (ref 39.0–52.0)
Hemoglobin: 14.3 g/dL (ref 13.0–17.0)
Lymphocytes Relative: 29.3 % (ref 12.0–46.0)
Lymphs Abs: 1.9 10*3/uL (ref 0.7–4.0)
MCHC: 32.1 g/dL (ref 30.0–36.0)
MCV: 87.5 fl (ref 78.0–100.0)
Monocytes Absolute: 0.6 10*3/uL (ref 0.1–1.0)
Monocytes Relative: 9.6 % (ref 3.0–12.0)
Neutro Abs: 3.8 10*3/uL (ref 1.4–7.7)
Neutrophils Relative %: 58.2 % (ref 43.0–77.0)
Platelets: 269 10*3/uL (ref 150.0–400.0)
RBC: 5.11 Mil/uL (ref 4.22–5.81)
RDW: 15.6 % — ABNORMAL HIGH (ref 11.5–15.5)
WBC: 6.6 10*3/uL (ref 4.0–10.5)

## 2021-08-19 LAB — HEMOGLOBIN A1C: Hgb A1c MFr Bld: 6.3 % (ref 4.6–6.5)

## 2021-08-19 LAB — LIPID PANEL
Cholesterol: 250 mg/dL — ABNORMAL HIGH (ref 0–200)
HDL: 46.8 mg/dL (ref >39.00)
LDL Cholesterol: 183 mg/dL — ABNORMAL HIGH (ref 0–99)
NonHDL: 202.73
Total CHOL/HDL Ratio: 5
Triglycerides: 100 mg/dL (ref 0.0–149.0)
VLDL: 20 mg/dL (ref 0.0–40.0)

## 2021-08-19 LAB — HEPATIC FUNCTION PANEL
ALT: 16 U/L (ref 0–53)
AST: 15 U/L (ref 0–37)
Albumin: 4.4 g/dL (ref 3.5–5.2)
Alkaline Phosphatase: 76 U/L (ref 39–117)
Bilirubin, Direct: 0.1 mg/dL (ref 0.0–0.3)
Total Bilirubin: 0.9 mg/dL (ref 0.2–1.2)
Total Protein: 7.6 g/dL (ref 6.0–8.3)

## 2021-08-19 LAB — T3, FREE: T3, Free: 3.8 pg/mL (ref 2.3–4.2)

## 2021-08-19 LAB — T4, FREE: Free T4: 0.64 ng/dL (ref 0.60–1.60)

## 2021-08-19 LAB — TSH: TSH: 12.76 u[IU]/mL — ABNORMAL HIGH (ref 0.35–5.50)

## 2021-08-19 NOTE — Progress Notes (Signed)
Joanny Dupree T. Dnya Hickle, MD, CAQ Sports Medicine [facility] at [facility] [address] [place], [address]  Phone: [phone]  FAX: [phone]  Steve Ashley - 56 y.o. male  MRN [id number]  Date of Birth: 16-Jun-1965  Date: 08/19/2021  PCP: Hannah Beat, MD  Referral: Hannah Beat, MD  Chief Complaint  Patient presents with   Annual Exam    This visit occurred during the SARS-CoV-2 public health emergency.  Safety protocols were in place, including screening questions prior to the visit, additional usage of staff PPE, and extensive cleaning of exam room while observing appropriate contact time as indicated for disinfecting solutions.   Patient Care Team: Hannah Beat, MD as PCP - General Subjective:   Steve Ashley is a 56 y.o. pleasant patient who presents with the following:  Preventative Health Maintenance Visit:  Health Maintenance Summary Reviewed and updated, unless pt declines services.  Tobacco History Reviewed. Alcohol: No concerns, no excessive use Exercise Habits: Some activity, rec at least 30 mins 5 times a week STD concerns: no risk or activity to increase risk Drug Use: None  He is compliant taking his heart medicine post STEMI. Add ASA 81 mg  Oldest - [employer] Other freshman at [employer].  Three things. Losing weight - was up to 392. Gave up soda.   Shingrix Covid boosters  2. ITCHY UNDER BELLOY  3. URGENCY WITH GOING TO THE URINATE, FORESKIN QUESTION - GOING TO THE BATHROOM A LOT NOW  DRINKING MASSIVE AMOUNTS OF WATER  Health Maintenance  Topic Date Due   COVID-19 Vaccine (3 - Pfizer risk series) 09/13/2019   INFLUENZA VACCINE  08/27/2021 (Originally 12/28/2020)   Zoster Vaccines- Shingrix (2 of 2) 10/14/2021   COLONOSCOPY (Pts 45-57yrs Insurance coverage will need to be confirmed)  12/17/2024   TETANUS/TDAP  10/01/2029   Hepatitis C Screening  Completed   HIV Screening  Completed   HPV VACCINES  Aged  Out   Immunization History  Administered Date(s) Administered   PFIZER(Purple Top)SARS-COV-2 Vaccination 07/26/2019, 08/16/2019   Tdap 10/19/2015, 10/02/2019   Zoster Recombinat (Shingrix) 08/19/2021   Patient Active Problem List   Diagnosis Date Noted   NSTEMI (non-ST elevated myocardial infarction) (HCC)     Priority: High   BMI 50.0-59.9, adult (HCC) 05/09/2013    Priority: Medium    OSA (obstructive sleep apnea) 08/29/2012    Priority: Medium    Hypothyroidism 01/06/2010    Priority: Medium    Hyperlipidemia LDL goal <70 01/06/2010    Priority: Medium    Essential hypertension 04/16/2018   Depression, major, single episode, moderate (HCC) 03/09/2017   Family history of prostate cancer 10/10/2013   HALLUX RIGIDUS 07/20/2009    Past Medical History:  Diagnosis Date   Asthma     hx of childhood asthma, none current   Essential hypertension 04/16/2018   Hyperlipidemia    Hypothyroidism    Morbid obesity (HCC)    NSTEMI (non-ST elevated myocardial infarction) (HCC)    OSA (obstructive sleep apnea) 08/29/2012   NPSG 10/2013:  AHI 75/hr with desat 58%    Past Surgical History:  Procedure Laterality Date   ANKLE SURGERY  age 43   left ankle    BACK SURGERY     COLONOSCOPY     HERNIA REPAIR     KNEE ARTHROSCOPY  yrs ago   right   LEFT HEART CATH AND CORONARY ANGIOGRAPHY Left 04/17/2018   Procedure: LEFT HEART CATH AND CORONARY ANGIOGRAPHY;  Surgeon: Marcina Millard, MD;  Location: [facility] INVASIVE CV LAB;  Service: Cardiovascular;  Laterality: Left;   SHOULDER ARTHROSCOPY WITH SUBACROMIAL DECOMPRESSION Right 05/17/2018   Procedure: SHOULDER ARTHROSCOPY WITH SUBACROMIAL DECOMPRESSION;  Surgeon: Jones Broom, MD;  Location: [facility] OR;  Service: Orthopedics;  Laterality: Right;   THUMB ARTHROSCOPY  age 33   left    VENTRAL HERNIA REPAIR  12/08/2011   Procedure: LAPAROSCOPIC VENTRAL HERNIA;  Surgeon: Atilano Ina, MD,FACS;  Location: [facility] ORS;  Service: General;   Laterality: N/A;   WISDOM TOOTH EXTRACTION      Family History  Problem Relation Age of Onset   Hypertension Mother    Cancer Father        prostate   Heart disease Neg Hx    Colon cancer Neg Hx    Diabetes Neg Hx     Past Medical History, Surgical History, Social History, Family History, Problem List, Medications, and Allergies have been reviewed and updated if relevant.  Review of Systems: Pertinent positives are listed above.  Otherwise, a full 14 point review of systems has been done in full and it is negative except where it is noted positive.  Objective:   BP 110/90   Pulse 85   Temp 98.2 F (36.8 C) (Oral)   Ht 5' 10.5" (1.791 m)   Wt (!) 381 lb (172.8 kg)   SpO2 99%   BMI 53.90 kg/m  Ideal Body Weight: Weight in (lb) to have BMI = 25: 176.4  Ideal Body Weight: Weight in (lb) to have BMI = 25: 176.4 No results found.    10/12/2017    9:00 AM 04/24/2017    4:22 PM 03/09/2017   11:51 AM 01/26/2017    3:37 PM 12/15/2016    9:07 AM  Depression screen PHQ 2/9  Decreased Interest 0 1 1  3   Down, Depressed, Hopeless 0 0 0 1 2  PHQ - 2 Score 0 1 1 1 5   Altered sleeping  1 1 2 2   Tired, decreased energy  1 2 2 2   Change in appetite  1 1 1 2   Feeling bad or failure about yourself   1 1 1 2   Trouble concentrating  0 1 1 0  Moving slowly or fidgety/restless  0 1 1 0  Suicidal thoughts  0 0 0 0  PHQ-9 Score  5 8 9 13   Difficult doing work/chores  Not difficult at all Somewhat difficult Somewhat difficult      GEN: well developed, well nourished, no acute distress Eyes: conjunctiva and lids normal, PERRLA, EOMI ENT: TM clear, nares clear, oral exam WNL Neck: supple, no lymphadenopathy, no thyromegaly, no JVD Pulm: clear to auscultation and percussion, respiratory effort normal CV: regular rate and rhythm, S1-S2, no murmur, rub or gallop, no bruits, peripheral pulses normal and symmetric, no cyanosis, clubbing, edema or varicosities GI: soft, non-tender; no  hepatosplenomegaly, masses; active bowel sounds all quadrants GU: Normal male.  Examined penis with request.  Foreskin appears normal and easily retractable.  Normal testicles. Lymph: no cervical, axillary or inguinal adenopathy MSK: gait normal, muscle tone and strength WNL, no joint swelling, effusions, discoloration, crepitus  SKIN: There is some irritation under the pannus  neuro: normal mental status, normal strength, sensation, and motion Psych: alert; oriented to person, place and time, normally interactive and not anxious or depressed in appearance.  All labs reviewed with patient. Results for orders placed or performed in visit on 08/19/21  Basic metabolic panel  Result  Value Ref Range   Sodium 141 135 - 145 mEq/L   Potassium 4.2 3.5 - 5.1 mEq/L   Chloride 103 96 - 112 mEq/L   CO2 31 19 - 32 mEq/L   Glucose, Bld 94 70 - 99 mg/dL   BUN 14 6 - 23 mg/dL   Creatinine, Ser 6.21 0.40 - 1.50 mg/dL   GFR 30.86 >57.84 mL/min   Calcium 9.7 8.4 - 10.5 mg/dL  TSH  Result Value Ref Range   TSH 12.76 (H) 0.35 - 5.50 uIU/mL  T4, free  Result Value Ref Range   Free T4 0.64 0.60 - 1.60 ng/dL  T3, free  Result Value Ref Range   T3, Free 3.8 2.3 - 4.2 pg/mL  PSA, Total with Reflex to PSA, Free  Result Value Ref Range   PSA, Total 0.7 < OR = 4.0 ng/mL  Hemoglobin A1c  Result Value Ref Range   Hgb A1c MFr Bld 6.3 4.6 - 6.5 %  CBC with Differential/Platelet  Result Value Ref Range   WBC 6.6 4.0 - 10.5 K/uL   RBC 5.11 4.22 - 5.81 Mil/uL   Hemoglobin 14.3 13.0 - 17.0 g/dL   HCT 69.6 29.5 - 28.4 %   MCV 87.5 78.0 - 100.0 fl   MCHC 32.1 30.0 - 36.0 g/dL   RDW 13.2 (H) 44.0 - 10.2 %   Platelets 269.0 150.0 - 400.0 K/uL   Neutrophils Relative % 58.2 43.0 - 77.0 %   Lymphocytes Relative 29.3 12.0 - 46.0 %   Monocytes Relative 9.6 3.0 - 12.0 %   Eosinophils Relative 2.0 0.0 - 5.0 %   Basophils Relative 0.9 0.0 - 3.0 %   Neutro Abs 3.8 1.4 - 7.7 K/uL   Lymphs Abs 1.9 0.7 - 4.0 K/uL    Monocytes Absolute 0.6 0.1 - 1.0 K/uL   Eosinophils Absolute 0.1 0.0 - 0.7 K/uL   Basophils Absolute 0.1 0.0 - 0.1 K/uL  Hepatic function panel  Result Value Ref Range   Total Bilirubin 0.9 0.2 - 1.2 mg/dL   Bilirubin, Direct 0.1 0.0 - 0.3 mg/dL   Alkaline Phosphatase 76 39 - 117 U/L   AST 15 0 - 37 U/L   ALT 16 0 - 53 U/L   Total Protein 7.6 6.0 - 8.3 g/dL   Albumin 4.4 3.5 - 5.2 g/dL  Lipid panel  Result Value Ref Range   Cholesterol 250 (H) 0 - 200 mg/dL   Triglycerides 725.3 0.0 - 149.0 mg/dL   HDL 66.44 >03.47 mg/dL   VLDL 42.5 0.0 - 95.6 mg/dL   LDL Cholesterol 387 (H) 0 - 99 mg/dL   Total CHOL/HDL Ratio 5    NonHDL 202.73     Assessment and Plan:     ICD-10-CM   1. Encounter for health maintenance examination with abnormal findings  Z00.01     2. Encounter for long-term (current) use of medications  Z79.899 Basic metabolic panel    CBC with Differential/Platelet    Hepatic function panel    3. Hypothyroidism, unspecified type  E03.9 TSH    T4, free    T3, free    4. Special screening for malignant neoplasm of prostate  Z12.5 PSA, Total with Reflex to PSA, Free    5. Screening for diabetes mellitus  Z13.1 Hemoglobin A1c    6. Hyperlipidemia, unspecified hyperlipidemia type  E78.5 Lipid panel    7. Need for shingles vaccine  Z23 Varicella-zoster vaccine IM (Shingrix)    8. Prediabetes  R73.03 Semaglutide,0.25 or 0.5MG /DOS, (OZEMPIC, 0.25 OR 0.5 MG/DOSE,) 2 MG/1.5ML SOPN    9. CAD in native artery  I25.10 Semaglutide,0.25 or 0.5MG /DOS, (OZEMPIC, 0.25 OR 0.5 MG/DOSE,) 2 MG/1.5ML SOPN    10. BMI 50.0-59.9, adult (HCC)  Z68.43 Semaglutide,0.25 or 0.5MG /DOS, (OZEMPIC, 0.25 OR 0.5 MG/DOSE,) 2 MG/1.5ML SOPN    11. Skin rash  R21      General health maintenance.  Shingrix today. Continue to work on weight loss.  Obtain all labs today.  Encourage exercise.  In addition to health maintenance: Additionally: with prediabetes, coronary disease, BMI of 54 patient is at  exceptionally high risk for comorbid problems and exacerbation of these issues.  I am going to prescribe Ozempic and slowly increase the dose for above.  Check thyroid levels, adjust if needed.  At this time, TSH is elevated  Hyperlipidemia.  Unstable.  He is on Crestor 40.  Adjust and confirm compliance.  Total cholesterol 250, LDL now greater than 180.  Concerning and coronary artery disease.  Reassurance regarding irritation underneath pannus.  No concern regarding phallus.  Health Maintenance Exam: The patient's preventative maintenance and recommended screening tests for an annual wellness exam were reviewed in full today. Brought up to date unless services declined.  Counselled on the importance of diet, exercise, and its role in overall health and mortality. The patient's FH and SH was reviewed, including their home life, tobacco status, and drug and alcohol status.  Follow-up in 1 year for physical exam or additional follow-up below.  Follow-up: No follow-ups on file. Or follow-up in 1 year if not noted.  Meds ordered this encounter  Medications   Semaglutide,0.25 or 0.5MG /DOS, (OZEMPIC, 0.25 OR 0.5 MG/DOSE,) 2 MG/1.5ML SOPN    Sig: Inject 0.25 mg into the skin once a week. Increase to 0.5 mg after 1 month    Dispense:  1.5 mL    Refill:  2   There are no discontinued medications. Orders Placed This Encounter  Procedures   Varicella-zoster vaccine IM (Shingrix)    Signed,  Teyana Pierron T. Irasema Chalk, MD   Allergies as of 08/19/2021       Reactions   Latex Itching        Medication List        Accurate as of August 19, 2021 11:59 PM. If you have any questions, ask your nurse or doctor.          diclofenac 75 MG EC tablet Commonly known as: VOLTAREN TAKE 1 TABLET BY MOUTH EVERY DAY IN THE EVENING   escitalopram 20 MG tablet Commonly known as: LEXAPRO TAKE 1 TABLET BY MOUTH EVERY DAY   hydrochlorothiazide 25 MG tablet Commonly known as: HYDRODIURIL TAKE 1  TABLET BY MOUTH EVERY DAY   levothyroxine 100 MCG tablet Commonly known as: SYNTHROID Take 1 tablet (100 mcg total) by mouth daily.   lidocaine 5 % Commonly known as: Lidoderm Place 1 patch onto the skin every 12 (twelve) hours. Remove & Discard patch within 12 hours or as directed by MD   metoprolol tartrate 25 MG tablet Commonly known as: LOPRESSOR TAKE 1 TABLET BY MOUTH TWICE A DAY   Ozempic (0.25 or 0.5 MG/DOSE) 2 MG/1.5ML Sopn Generic drug: Semaglutide(0.25 or 0.5MG /DOS) Inject 0.25 mg into the skin once a week. Increase to 0.5 mg after 1 month Started by: Hannah Beat, MD   rosuvastatin 40 MG tablet Commonly known as: CRESTOR TAKE 1 TABLET BY MOUTH DAILY AT 6 PM

## 2021-08-20 LAB — PSA, TOTAL WITH REFLEX TO PSA, FREE: PSA, Total: 0.7 ng/mL (ref <=4.0)

## 2021-08-21 ENCOUNTER — Encounter: Payer: Self-pay | Admitting: Family Medicine

## 2021-08-21 MED ORDER — OZEMPIC (0.25 OR 0.5 MG/DOSE) 2 MG/1.5ML ~~LOC~~ SOPN: 0.2500 mg | PEN_INJECTOR | SUBCUTANEOUS | 2 refills | Status: DC

## 2021-09-07 ENCOUNTER — Encounter: Payer: Self-pay | Admitting: *Deleted

## 2021-09-28 ENCOUNTER — Other Ambulatory Visit: Payer: Self-pay | Admitting: Family Medicine

## 2021-11-22 ENCOUNTER — Encounter: Payer: Self-pay | Admitting: Family Medicine

## 2021-11-22 ENCOUNTER — Ambulatory Visit: Payer: BC Managed Care – PPO | Admitting: Family Medicine

## 2021-11-22 ENCOUNTER — Telehealth: Payer: Self-pay | Admitting: *Deleted

## 2021-11-22 VITALS — BP 128/86 | HR 80 | Temp 98.1°F | Ht 70.5 in | Wt 379.0 lb

## 2021-11-22 DIAGNOSIS — E038 Other specified hypothyroidism: Secondary | ICD-10-CM

## 2021-11-22 DIAGNOSIS — I2510 Atherosclerotic heart disease of native coronary artery without angina pectoris: Secondary | ICD-10-CM | POA: Diagnosis not present

## 2021-11-22 DIAGNOSIS — E785 Hyperlipidemia, unspecified: Secondary | ICD-10-CM | POA: Diagnosis not present

## 2021-11-22 HISTORY — DX: Atherosclerotic heart disease of native coronary artery without angina pectoris: I25.10

## 2021-11-22 MED ORDER — WEGOVY 0.25 MG/0.5ML ~~LOC~~ SOAJ: 0.2500 mg | SUBCUTANEOUS | 3 refills | Status: DC

## 2021-11-23 LAB — TSH: TSH: 5.72 u[IU]/mL — ABNORMAL HIGH (ref 0.35–5.50)

## 2021-11-23 LAB — LIPID PANEL
Cholesterol: 169 mg/dL (ref 0–200)
HDL: 41.6 mg/dL (ref >39.00)
LDL Cholesterol: 104 mg/dL — ABNORMAL HIGH (ref 0–99)
NonHDL: 127.29
Total CHOL/HDL Ratio: 4
Triglycerides: 118 mg/dL (ref 0.0–149.0)
VLDL: 23.6 mg/dL (ref 0.0–40.0)

## 2021-11-23 LAB — T4, FREE: Free T4: 0.75 ng/dL (ref 0.60–1.60)

## 2021-11-23 LAB — T3, FREE: T3, Free: 2.7 pg/mL (ref 2.3–4.2)

## 2022-01-05 ENCOUNTER — Other Ambulatory Visit: Payer: Self-pay | Admitting: Family Medicine

## 2022-08-16 ENCOUNTER — Other Ambulatory Visit (HOSPITAL_COMMUNITY): Payer: Self-pay

## 2022-09-01 ENCOUNTER — Other Ambulatory Visit (HOSPITAL_COMMUNITY): Payer: Self-pay

## 2022-09-01 ENCOUNTER — Telehealth: Payer: Self-pay

## 2022-09-01 NOTE — Telephone Encounter (Signed)
Please call and schedule CPE with fasting labs prior with Dr. [name].  

## 2022-09-01 NOTE — Telephone Encounter (Signed)
Pharmacy Patient Advocate Encounter   Received notification from [facility] that prior authorization for Steve Ashley is required/requested.  Per the [facility], weight-loss drugs are not longer covered under the plan.

## 2022-09-01 NOTE — Telephone Encounter (Signed)
Raney notified by telephone that [employer] does not cover weight loss medications.  Patient states understanding.

## 2022-09-01 NOTE — Telephone Encounter (Signed)
Can you let the patient know that it is not covered.  Thanks.

## 2022-09-02 NOTE — Telephone Encounter (Signed)
Called patient and set up appt for cpe/ labs.

## 2022-09-12 ENCOUNTER — Other Ambulatory Visit: Payer: Self-pay | Admitting: Family Medicine

## 2022-09-12 DIAGNOSIS — Z79899 Other long term (current) drug therapy: Secondary | ICD-10-CM

## 2022-09-12 DIAGNOSIS — Z125 Encounter for screening for malignant neoplasm of prostate: Secondary | ICD-10-CM

## 2022-09-12 DIAGNOSIS — E785 Hyperlipidemia, unspecified: Secondary | ICD-10-CM

## 2022-09-12 DIAGNOSIS — Z131 Encounter for screening for diabetes mellitus: Secondary | ICD-10-CM

## 2022-09-12 DIAGNOSIS — E038 Other specified hypothyroidism: Secondary | ICD-10-CM

## 2022-09-21 ENCOUNTER — Other Ambulatory Visit (INDEPENDENT_AMBULATORY_CARE_PROVIDER_SITE_OTHER): Payer: BC Managed Care – PPO

## 2022-09-21 DIAGNOSIS — Z131 Encounter for screening for diabetes mellitus: Secondary | ICD-10-CM | POA: Diagnosis not present

## 2022-09-21 DIAGNOSIS — E785 Hyperlipidemia, unspecified: Secondary | ICD-10-CM | POA: Diagnosis not present

## 2022-09-21 DIAGNOSIS — Z79899 Other long term (current) drug therapy: Secondary | ICD-10-CM

## 2022-09-21 DIAGNOSIS — E038 Other specified hypothyroidism: Secondary | ICD-10-CM

## 2022-09-21 DIAGNOSIS — Z125 Encounter for screening for malignant neoplasm of prostate: Secondary | ICD-10-CM

## 2022-09-22 LAB — CBC WITH DIFFERENTIAL/PLATELET
Basophils Absolute: 0.1 10*3/uL (ref 0.0–0.1)
Basophils Relative: 0.7 % (ref 0.0–3.0)
Eosinophils Absolute: 0.3 10*3/uL (ref 0.0–0.7)
Eosinophils Relative: 4 % (ref 0.0–5.0)
HCT: 45.5 % (ref 39.0–52.0)
Hemoglobin: 15 g/dL (ref 13.0–17.0)
Lymphocytes Relative: 31.3 % (ref 12.0–46.0)
Lymphs Abs: 2.5 10*3/uL (ref 0.7–4.0)
MCHC: 32.9 g/dL (ref 30.0–36.0)
MCV: 88 fl (ref 78.0–100.0)
Monocytes Absolute: 0.7 10*3/uL (ref 0.1–1.0)
Monocytes Relative: 9.4 % (ref 3.0–12.0)
Neutro Abs: 4.3 10*3/uL (ref 1.4–7.7)
Neutrophils Relative %: 54.6 % (ref 43.0–77.0)
Platelets: 275 10*3/uL (ref 150.0–400.0)
RBC: 5.17 Mil/uL (ref 4.22–5.81)
RDW: 15.9 % — ABNORMAL HIGH (ref 11.5–15.5)
WBC: 7.8 10*3/uL (ref 4.0–10.5)

## 2022-09-22 LAB — TSH: TSH: 4.81 u[IU]/mL (ref 0.35–5.50)

## 2022-09-22 LAB — T3, FREE: T3, Free: 2.9 pg/mL (ref 2.3–4.2)

## 2022-09-22 LAB — BASIC METABOLIC PANEL
BUN: 15 mg/dL (ref 6–23)
CO2: 34 mEq/L — ABNORMAL HIGH (ref 19–32)
Calcium: 9.9 mg/dL (ref 8.4–10.5)
Chloride: 100 mEq/L (ref 96–112)
Creatinine, Ser: 1.04 mg/dL (ref 0.40–1.50)
GFR: 79.45 mL/min (ref >60.00)
Glucose, Bld: 90 mg/dL (ref 70–99)
Potassium: 4 mEq/L (ref 3.5–5.1)
Sodium: 142 mEq/L (ref 135–145)

## 2022-09-22 LAB — LIPID PANEL
Cholesterol: 161 mg/dL (ref 0–200)
HDL: 42.8 mg/dL (ref >39.00)
LDL Cholesterol: 95 mg/dL (ref 0–99)
NonHDL: 118.04
Total CHOL/HDL Ratio: 4
Triglycerides: 115 mg/dL (ref 0.0–149.0)
VLDL: 23 mg/dL (ref 0.0–40.0)

## 2022-09-22 LAB — HEMOGLOBIN A1C: Hgb A1c MFr Bld: 6.2 % (ref 4.6–6.5)

## 2022-09-22 LAB — HEPATIC FUNCTION PANEL
ALT: 26 U/L (ref 0–53)
AST: 22 U/L (ref 0–37)
Albumin: 4.4 g/dL (ref 3.5–5.2)
Alkaline Phosphatase: 65 U/L (ref 39–117)
Bilirubin, Direct: 0.1 mg/dL (ref 0.0–0.3)
Total Bilirubin: 0.7 mg/dL (ref 0.2–1.2)
Total Protein: 7.8 g/dL (ref 6.0–8.3)

## 2022-09-22 LAB — T4, FREE: Free T4: 0.81 ng/dL (ref 0.60–1.60)

## 2022-09-22 LAB — PSA, TOTAL WITH REFLEX TO PSA, FREE: PSA, Total: 0.7 ng/mL (ref <=4.0)

## 2022-09-27 DIAGNOSIS — R7303 Prediabetes: Secondary | ICD-10-CM | POA: Insufficient documentation

## 2022-09-27 NOTE — Progress Notes (Unsigned)
Shacoria Latif T. Tradarius Reinwald, MD, CAQ Sports Medicine [facility] at [facility] [address] [place], [address]  Phone: [phone]  FAX: [phone]  Steve Ashley - 57 y.o. male  MRN [id number]  Date of Birth: 1966-01-27  Date: 09/28/2022  PCP: Hannah Beat, MD  Referral: Hannah Beat, MD  No chief complaint on file.  Patient Care Team: Hannah Beat, MD as PCP - General Subjective:   Steve Ashley is a 57 y.o. pleasant patient who presents with the following:  Preventative Health Maintenance Visit:  Health Maintenance Summary Reviewed and updated, unless pt declines services.  He is a well-known gentleman with a history of coronary disease status post NSTEMI, hypothyroidism, morbid obesity, hypertension, hyperlipidemia.  Presents today for general health maintenance exam.  Tobacco History Reviewed. Alcohol: No concerns, no excessive use Exercise Habits: Some activity, rec at least 30 mins 5 times a week STD concerns: no risk or activity to increase risk Drug Use: None  Shingrix #2 Covid booster  Health Maintenance  Topic Date Due   COVID-19 Vaccine (3 - Pfizer risk series) 09/13/2019   Zoster Vaccines- Shingrix (2 of 2) 10/14/2021   INFLUENZA VACCINE  12/29/2022   COLONOSCOPY (Pts 45-43yrs Insurance coverage will need to be confirmed)  12/17/2024   DTaP/Tdap/Td (3 - Td or Tdap) 10/01/2029   Hepatitis C Screening  Completed   HIV Screening  Completed   HPV VACCINES  Aged Out   Immunization History  Administered Date(s) Administered   PFIZER(Purple Top)SARS-COV-2 Vaccination 07/26/2019, 08/16/2019   Tdap 10/19/2015, 10/02/2019   Zoster Recombinat (Shingrix) 08/19/2021   Thyroid: No symptoms. Labs reviewed. Denies cold / heat intolerance, dry skin, hair loss. No goiter.  Lab Results  Component Value Date   TSH 4.81 09/21/2022     Patient Active Problem List   Diagnosis Date Noted   CAD (Coronary Artery Disease)  11/22/2021    Priority: High   NSTEMI (non-ST elevated myocardial infarction) [facility])     Priority: High   Essential hypertension 04/16/2018    Priority: Medium    BMI 50.0-59.9, adult (HCC) 05/09/2013    Priority: Medium    OSA (obstructive sleep apnea) 08/29/2012    Priority: Medium    Hypothyroidism 01/06/2010    Priority: Medium    Hyperlipidemia LDL goal <70 01/06/2010    Priority: Medium    Depression, major, single episode, moderate (HCC) 03/09/2017   Family history of prostate cancer 10/10/2013   HALLUX RIGIDUS 07/20/2009    Past Medical History:  Diagnosis Date   Asthma     hx of childhood asthma, none current   CAD (coronary artery disease), native coronary artery 11/22/2021   Essential hypertension 04/16/2018   Hyperlipidemia    Hypothyroidism    Morbid obesity (HCC)    NSTEMI (non-ST elevated myocardial infarction) (HCC)    OSA (obstructive sleep apnea) 08/29/2012   NPSG 10/2013:  AHI 75/hr with desat 58%    Past Surgical History:  Procedure Laterality Date   ANKLE SURGERY  age 63   left ankle    BACK SURGERY     COLONOSCOPY     HERNIA REPAIR     KNEE ARTHROSCOPY  yrs ago   right   LEFT HEART CATH AND CORONARY ANGIOGRAPHY Left 04/17/2018   Procedure: LEFT HEART CATH AND CORONARY ANGIOGRAPHY;  Surgeon: Marcina Millard, MD;  Location: [facility] INVASIVE CV LAB;  Service: Cardiovascular;  Laterality: Left;   SHOULDER ARTHROSCOPY WITH SUBACROMIAL DECOMPRESSION Right 05/17/2018  Procedure: SHOULDER ARTHROSCOPY WITH SUBACROMIAL DECOMPRESSION;  Surgeon: Jones Broom, MD;  Location: [facility] OR;  Service: Orthopedics;  Laterality: Right;   THUMB ARTHROSCOPY  age 20   left    VENTRAL HERNIA REPAIR  12/08/2011   Procedure: LAPAROSCOPIC VENTRAL HERNIA;  Surgeon: Atilano Ina, MD,FACS;  Location: [facility] ORS;  Service: General;  Laterality: N/A;   WISDOM TOOTH EXTRACTION      Family History  Problem Relation Age of Onset   Hypertension Mother    Cancer Father         prostate   Heart disease Neg Hx    Colon cancer Neg Hx    Diabetes Neg Hx     Social History   Social History Narrative   Divorced   2 sons   PE Runner, broadcasting/film/video at [employer], television/film/video at [employer]    Past Medical History, Surgical History, Social History, Family History, Problem List, Medications, and Allergies have been reviewed and updated if relevant.  Review of Systems: Pertinent positives are listed above.  Otherwise, a full 14 point review of systems has been done in full and it is negative except where it is noted positive.  Objective:   There were no vitals taken for this visit. Ideal Body Weight:    Ideal Body Weight:   No results found.    10/12/2017    9:00 AM 04/24/2017    4:22 PM 03/09/2017   11:51 AM 01/26/2017    3:37 PM 12/15/2016    9:07 AM  Depression screen PHQ 2/9  Decreased Interest 0 1 1  3   Down, Depressed, Hopeless 0 0 0 1 2  PHQ - 2 Score 0 1 1 1 5   Altered sleeping  1 1 2 2   Tired, decreased energy  1 2 2 2   Change in appetite  1 1 1 2   Feeling bad or failure about yourself   1 1 1 2   Trouble concentrating  0 1 1 0  Moving slowly or fidgety/restless  0 1 1 0  Suicidal thoughts  0 0 0 0  PHQ-9 Score  5 8 9 13   Difficult doing work/chores  Not difficult at all Somewhat difficult Somewhat difficult      GEN: well developed, well nourished, no acute distress Eyes: conjunctiva and lids normal, PERRLA, EOMI ENT: TM clear, nares clear, oral exam WNL Neck: supple, no lymphadenopathy, no thyromegaly, no JVD Pulm: clear to auscultation and percussion, respiratory effort normal CV: regular rate and rhythm, S1-S2, no murmur, rub or gallop, no bruits, peripheral pulses normal and symmetric, no cyanosis, clubbing, edema or varicosities GI: soft, non-tender; no hepatosplenomegaly, masses; active bowel sounds all quadrants GU: deferred Lymph: no cervical, axillary or inguinal adenopathy MSK: gait normal, muscle tone and  strength WNL, no joint swelling, effusions, discoloration, crepitus  SKIN: clear, good turgor, color WNL, no rashes, lesions, or ulcerations Neuro: normal mental status, normal strength, sensation, and motion Psych: alert; oriented to person, place and time, normally interactive and not anxious or depressed in appearance.  All labs reviewed with patient. Results for orders placed or performed in visit on 09/21/22  T4, free  Result Value Ref Range   Free T4 0.81 0.60 - 1.60 ng/dL  T3, free  Result Value Ref Range   T3, Free 2.9 2.3 - 4.2 pg/mL  TSH  Result Value Ref Range   TSH 4.81 0.35 - 5.50 uIU/mL  PSA, Total with Reflex to PSA, Free  Result Value Ref Range   PSA, Total 0.7 < OR = 4.0 ng/mL  Lipid panel  Result Value Ref Range   Cholesterol 161 0 - 200 mg/dL   Triglycerides 161.0 0.0 - 149.0 mg/dL   HDL 96.04 >54.09 mg/dL   VLDL 81.1 0.0 - 91.4 mg/dL   LDL Cholesterol 95 0 - 99 mg/dL   Total CHOL/HDL Ratio 4    NonHDL 118.04   Hemoglobin A1c  Result Value Ref Range   Hgb A1c MFr Bld 6.2 4.6 - 6.5 %  Hepatic function panel  Result Value Ref Range   Total Bilirubin 0.7 0.2 - 1.2 mg/dL   Bilirubin, Direct 0.1 0.0 - 0.3 mg/dL   Alkaline Phosphatase 65 39 - 117 U/L   AST 22 0 - 37 U/L   ALT 26 0 - 53 U/L   Total Protein 7.8 6.0 - 8.3 g/dL   Albumin 4.4 3.5 - 5.2 g/dL  CBC with Differential/Platelet  Result Value Ref Range   WBC 7.8 4.0 - 10.5 K/uL   RBC 5.17 4.22 - 5.81 Mil/uL   Hemoglobin 15.0 13.0 - 17.0 g/dL   HCT 78.2 95.6 - 21.3 %   MCV 88.0 78.0 - 100.0 fl   MCHC 32.9 30.0 - 36.0 g/dL   RDW 08.6 (H) 57.8 - 46.9 %   Platelets 275.0 150.0 - 400.0 K/uL   Neutrophils Relative % 54.6 43.0 - 77.0 %   Lymphocytes Relative 31.3 12.0 - 46.0 %   Monocytes Relative 9.4 3.0 - 12.0 %   Eosinophils Relative 4.0 0.0 - 5.0 %   Basophils Relative 0.7 0.0 - 3.0 %   Neutro Abs 4.3 1.4 - 7.7 K/uL   Lymphs Abs 2.5 0.7 - 4.0 K/uL   Monocytes Absolute 0.7 0.1 - 1.0 K/uL    Eosinophils Absolute 0.3 0.0 - 0.7 K/uL   Basophils Absolute 0.1 0.0 - 0.1 K/uL  Basic metabolic panel  Result Value Ref Range   Sodium 142 135 - 145 mEq/L   Potassium 4.0 3.5 - 5.1 mEq/L   Chloride 100 96 - 112 mEq/L   CO2 34 (H) 19 - 32 mEq/L   Glucose, Bld 90 70 - 99 mg/dL   BUN 15 6 - 23 mg/dL   Creatinine, Ser 6.29 0.40 - 1.50 mg/dL   GFR 52.84 >13.24 mL/min   Calcium 9.9 8.4 - 10.5 mg/dL    Assessment and Plan:     ICD-10-CM   1. Healthcare maintenance  Z00.00       Health Maintenance Exam: The patient's preventative maintenance and recommended screening tests for an annual wellness exam were reviewed in full today. Brought up to date unless services declined.  Counselled on the importance of diet, exercise, and its role in overall health and mortality. The patient's FH and SH was reviewed, including their home life, tobacco status, and drug and alcohol status.  Follow-up in 1 year for physical exam or additional follow-up below.  Disposition: No follow-ups on file.  No orders of the defined types were placed in this encounter.  There are no discontinued medications. No orders of the defined types were placed in this encounter.   Signed,  Elpidio Galea. Kimberlye Dilger, MD   Allergies as of 09/28/2022       Reactions   Latex Itching        Medication List        Accurate as of September 27, 2022  5:13 PM. If you have any questions, ask your nurse  or doctor.          diclofenac 75 MG EC tablet Commonly known as: VOLTAREN TAKE 1 TABLET BY MOUTH EVERY DAY IN THE EVENING   escitalopram 20 MG tablet Commonly known as: LEXAPRO TAKE 1 TABLET BY MOUTH EVERY DAY   hydrochlorothiazide 25 MG tablet Commonly known as: HYDRODIURIL TAKE 1 TABLET BY MOUTH EVERY DAY   levothyroxine 100 MCG tablet Commonly known as: SYNTHROID TAKE 1 TABLET BY MOUTH EVERY DAY   metoprolol tartrate 25 MG tablet Commonly known as: LOPRESSOR TAKE 1 TABLET BY MOUTH TWICE A DAY    rosuvastatin 40 MG tablet Commonly known as: CRESTOR TAKE 1 TABLET BY MOUTH DAILY AT 6 PM   Wegovy 0.25 MG/0.5ML Soaj Generic drug: Semaglutide-Weight Management Inject 0.25 mg into the skin once a week. Increase to 0.5 mg after 1 month

## 2022-09-28 ENCOUNTER — Encounter: Payer: Self-pay | Admitting: Family Medicine

## 2022-09-28 ENCOUNTER — Ambulatory Visit (INDEPENDENT_AMBULATORY_CARE_PROVIDER_SITE_OTHER): Payer: BC Managed Care – PPO | Admitting: Family Medicine

## 2022-09-28 VITALS — BP 106/68 | HR 73 | Temp 98.7°F | Ht 72.0 in | Wt 374.6 lb

## 2022-09-28 DIAGNOSIS — Z23 Encounter for immunization: Secondary | ICD-10-CM

## 2022-09-28 DIAGNOSIS — R7303 Prediabetes: Secondary | ICD-10-CM

## 2022-09-28 DIAGNOSIS — Z0000 Encounter for general adult medical examination without abnormal findings: Secondary | ICD-10-CM | POA: Diagnosis not present

## 2022-09-28 MED ORDER — DOXYCYCLINE HYCLATE 100 MG PO TABS: 100.0000 mg | ORAL_TABLET | Freq: Two times a day (BID) | ORAL | 0 refills | Status: DC

## 2022-12-30 ENCOUNTER — Other Ambulatory Visit: Payer: Self-pay | Admitting: Family Medicine

## 2022-12-30 MED ORDER — METOPROLOL TARTRATE 25 MG PO TABS: 25.0000 mg | ORAL_TABLET | Freq: Two times a day (BID) | ORAL | 3 refills | Status: DC

## 2022-12-30 MED ORDER — LEVOTHYROXINE SODIUM 100 MCG PO TABS: 100.0000 ug | ORAL_TABLET | Freq: Every day | ORAL | 3 refills | Status: DC

## 2022-12-30 MED ORDER — HYDROCHLOROTHIAZIDE 25 MG PO TABS: 25.0000 mg | ORAL_TABLET | Freq: Every day | ORAL | 3 refills | Status: DC

## 2022-12-30 NOTE — Addendum Note (Signed)
Addended by: Damita Lack on: 12/30/2022 04:38 PM   Modules accepted: Orders

## 2023-02-08 ENCOUNTER — Other Ambulatory Visit: Payer: Self-pay | Admitting: Family Medicine

## 2023-02-08 NOTE — Telephone Encounter (Signed)
Last office visit 09/28/2022 for CPE.  Last refilled 06/23/2021 for #90 with 1 refill.  Next Appt: No future appointments.

## 2023-02-10 ENCOUNTER — Other Ambulatory Visit: Payer: Self-pay | Admitting: Family Medicine

## 2023-03-01 ENCOUNTER — Ambulatory Visit: Payer: BC Managed Care – PPO | Admitting: Surgery

## 2023-03-01 ENCOUNTER — Encounter: Payer: Self-pay | Admitting: Surgery

## 2023-03-01 VITALS — BP 119/78 | HR 71 | Temp 98.2°F | Ht 71.75 in | Wt 373.0 lb

## 2023-03-01 DIAGNOSIS — L720 Epidermal cyst: Secondary | ICD-10-CM | POA: Diagnosis not present

## 2023-03-01 NOTE — Patient Instructions (Signed)
Finish all of your antibiotics.   We will have you follow up here in 4 weeks.

## 2023-03-03 ENCOUNTER — Encounter: Payer: Self-pay | Admitting: Surgery

## 2023-03-03 NOTE — Progress Notes (Signed)
Patient ID: Steve Ashley, male   DOB: 14-Feb-1966, 57 y.o.   MRN: [id number]  HPI Amir B Woldt is a 57 y.o. male seen in consultation at the request of Dr. Wallene Huh.  Reports a 1 week history of back pain associated with drainage.  Reports a lump on the left side of his back.  The pain is intermittent moderate intensity and worsening with pressure.  He does have significant comorbidities including high BMI, coronary artery disease, hypertension.  Misty Stanley his primary care physician and when he was placed on doxycycline.  He reports he feels a bit better after initiation of antibiotic. He denies any fevers any chills. He did have a prior laparoscopic ventral hernia repair  HPI  Past Medical History:  Diagnosis Date   Asthma     hx of childhood asthma, none current   CAD (coronary artery disease), native coronary artery 11/22/2021   Essential hypertension 04/16/2018   Hyperlipidemia    Hypothyroidism    Morbid obesity (HCC)    NSTEMI (non-ST elevated myocardial infarction) (HCC)    OSA (obstructive sleep apnea) 08/29/2012   NPSG 10/2013:  AHI 75/hr with desat 58%    Past Surgical History:  Procedure Laterality Date   ANKLE SURGERY  age 56   left ankle    BACK SURGERY     COLONOSCOPY     HERNIA REPAIR     Umbilical   KNEE ARTHROSCOPY Bilateral yrs ago   right   LEFT HEART CATH AND CORONARY ANGIOGRAPHY Left 04/17/2018   Procedure: LEFT HEART CATH AND CORONARY ANGIOGRAPHY;  Surgeon: Marcina Millard, MD;  Location: [facility] INVASIVE CV LAB;  Service: Cardiovascular;  Laterality: Left;   SHOULDER ARTHROSCOPY WITH SUBACROMIAL DECOMPRESSION Right 05/17/2018   Procedure: SHOULDER ARTHROSCOPY WITH SUBACROMIAL DECOMPRESSION;  Surgeon: Jones Broom, MD;  Location: [facility] OR;  Service: Orthopedics;  Laterality: Right;   THUMB ARTHROSCOPY Left age 77   left    VENTRAL HERNIA REPAIR  12/08/2011   Procedure: LAPAROSCOPIC VENTRAL HERNIA;  Surgeon: Atilano Ina, MD,FACS;  Location: [facility] ORS;   Service: General;  Laterality: N/A;   WISDOM TOOTH EXTRACTION      Family History  Problem Relation Age of Onset   Hypertension Mother    Cancer Father        prostate   Heart disease Neg Hx    Colon cancer Neg Hx    Diabetes Neg Hx     Social History Social History   Tobacco Use   Smoking status: Never    Passive exposure: Never   Smokeless tobacco: Never  Vaping Use   Vaping status: Never Used  Substance Use Topics   Alcohol use: Yes    Alcohol/week: 0.0 standard drinks of alcohol    Comment: occassional; once monthly   Drug use: Never    Allergies  Allergen Reactions   Latex Itching    Current Outpatient Medications  Medication Sig Dispense Refill   diclofenac (VOLTAREN) 75 MG EC tablet TAKE 1 TABLET BY MOUTH EVERY DAY IN THE EVENING 90 tablet 3   doxycycline (VIBRA-TABS) 100 MG tablet Take 1 tablet (100 mg total) by mouth 2 (two) times daily. 20 tablet 0   escitalopram (LEXAPRO) 20 MG tablet TAKE 1 TABLET BY MOUTH EVERY DAY 90 tablet 1   hydrochlorothiazide (HYDRODIURIL) 25 MG tablet Take 1 tablet (25 mg total) by mouth daily. 90 tablet 3   levothyroxine (SYNTHROID) 100 MCG tablet Take 1 tablet (100 mcg total) by mouth daily. 90 tablet  3   metoprolol tartrate (LOPRESSOR) 25 MG tablet Take 1 tablet (25 mg total) by mouth 2 (two) times daily. 180 tablet 3   rosuvastatin (CRESTOR) 40 MG tablet TAKE 1 TABLET BY MOUTH DAILY AT 6 PM 90 tablet 1   No current facility-administered medications for this visit.     Review of Systems Full ROS  was asked and was negative except for the information on the HPI  Physical Exam Blood pressure 119/78, pulse 71, temperature 98.2 F (36.8 C), height 5' 11.75" (1.822 m), weight (!) 373 lb (169.2 kg), SpO2 96%. CONSTITUTIONAL: NAD , BMI 51. EYES: Pupils are equal, round,  Sclera are non-icteric. EARS, NOSE, MOUTH AND THROAT:  The oral mucosa is pink and moist. Hearing is intact to voice. LYMPH NODES:  Lymph nodes in the neck  are normal. RESPIRATORY:  Lungs are clear. There is normal respiratory effort, with equal breath sounds bilaterally, and without pathologic use of accessory muscles. CARDIOVASCULAR: Heart is regular without murmurs, gallops, or rubs. GI: The abdomen is  soft, nontender, and nondistended. There are no palpable masses. There is no hepatosplenomegaly. There are normal bowel sounds in all quadrants. GU: Rectal deferred.   MUSCULOSKELETAL: Normal muscle strength and tone. No cyanosis or edema.   SKIN: There is evidence of an epidermal inclusion cyst on the left back.  Mildly tender to palpation.  There is an opening and is draining purulence.  No evidence of necrotizing infection.  There is significant induration NEUROLOGIC: Motor and sensation is grossly normal. Cranial nerves are grossly intact. PSYCH:  Oriented to person, place and time. Affect is normal.  Data Reviewed  I have personally reviewed the patient's imaging, laboratory findings and medical records.    Assessment/Plan   57 year old male with an infected epidermal inclusion cyst in the back.  Right now no need for excision.  We will continue antibiotic therapy and then see him back again in 3 to 4 weeks hopefully at that time inflammation has subsided enough that we can perform an excision and primary closure.  With the patient in detail about his disease process.  He agrees with the plan. Please note I spent 40 minutes in this encounter including person reviewing medical records, counseling the patient, placing orders and performing documentation  Copy of this report was sent to the referring provider  Sterling Big, MD FACS General Surgeon 03/03/2023, 3:14 PM

## 2023-04-03 ENCOUNTER — Ambulatory Visit (INDEPENDENT_AMBULATORY_CARE_PROVIDER_SITE_OTHER): Payer: BC Managed Care – PPO | Admitting: Surgery

## 2023-04-03 ENCOUNTER — Encounter: Payer: Self-pay | Admitting: Surgery

## 2023-04-03 VITALS — BP 141/91 | HR 62 | Temp 98.4°F | Ht 71.75 in | Wt 389.6 lb

## 2023-04-03 DIAGNOSIS — L720 Epidermal cyst: Secondary | ICD-10-CM

## 2023-04-03 NOTE — Patient Instructions (Signed)
If you have any concerns or questions, please feel free to call our office. See follow up appointment.   Epidermoid Cyst  An epidermoid cyst, also called an epidermal cyst, is a small lump under your skin. The cyst contains a substance called keratin. Do not try to pop or open the cyst yourself. What are the causes? A blocked hair follicle. A hair that curls and re-enters the skin instead of growing straight out of the skin. A blocked pore. Irritated skin. An injury to the skin. Certain conditions that are passed along from parent to child. Human papillomavirus (HPV). This happens rarely when cysts occur on the bottom of the feet. Long-term sun damage to the skin. What increases the risk? Having acne. Being male. Having an injury to the skin. Being past puberty. Having certain conditions caused by genes (genetic disorder) What are the signs or symptoms? These cysts are usually harmless, but they can get infected. Symptoms of infection may include: Redness. Inflammation. Tenderness. Warmth. Fever. A bad-smelling substance that drains from the cyst. Pus that drains from the cyst. How is this treated? In many cases, epidermoid cysts go away on their own without treatment. If a cyst becomes infected, treatment may include: Opening and draining the cyst, done by a doctor. After draining, you may need minor surgery to remove the rest of the cyst. Antibiotic medicine. Shots of medicines (steroids) that help to reduce inflammation. Surgery to remove the cyst. Surgery may be done if the cyst: Becomes large. Bothers you. Has a chance of turning into cancer. Do not try to open a cyst yourself. Follow these instructions at home: Medicines Take over-the-counter and prescription medicines as told by your doctor. If you were prescribed an antibiotic medicine, take it as told by your doctor. Do not stop taking it even if you start to feel better. General instructions Keep the area around  your cyst clean and dry. Wear loose, dry clothing. Avoid touching your cyst. Check your cyst every day for signs of infection. Check for: Redness, swelling, or pain. Fluid or blood. Warmth. Pus or a bad smell. Keep all follow-up visits. How is this prevented? Wear clean, dry, clothing. Avoid wearing tight clothing. Keep your skin clean and dry. Take showers or baths every day. Contact a doctor if: Your cyst has symptoms of infection. Your condition does not improve or gets worse. You have a cyst that looks different from other cysts you have had. You have a fever. Get help right away if: Redness spreads from the cyst into the area close by. Summary An epidermoid cyst is a small lump under your skin. If a cyst becomes infected, treatment may include surgery to open and drain the cyst, or to remove it. Take over-the-counter and prescription medicines only as told by your doctor. Contact a doctor if your condition is not improving or is getting worse. Keep all follow-up visits. This information is not intended to replace advice given to you by your health care provider. Make sure you discuss any questions you have with your health care provider. Document Revised: 08/20/2019 Document Reviewed: 08/21/2019 Elsevier Patient Education  2024 [employer].

## 2023-04-04 NOTE — Progress Notes (Addendum)
Outpatient Surgical Follow Up   Steve Ashley is an 57 y.o. male.   Chief Complaint  Patient presents with   Follow-up    Sebaceous cyst on back    HPI: EIC infection resolved, not draining anymore completed a/bs. Pt wishes to prevent further infection and wishes to have cyst removed.  Past Medical History:  Diagnosis Date   Asthma     hx of childhood asthma, none current   CAD (coronary artery disease), native coronary artery 11/22/2021   Essential hypertension 04/16/2018   Hyperlipidemia    Hypothyroidism    Morbid obesity (HCC)    NSTEMI (non-ST elevated myocardial infarction) (HCC)    OSA (obstructive sleep apnea) 08/29/2012   NPSG 10/2013:  AHI 75/hr with desat 58%    Past Surgical History:  Procedure Laterality Date   ANKLE SURGERY  age 37   left ankle    BACK SURGERY     COLONOSCOPY     HERNIA REPAIR     Umbilical   KNEE ARTHROSCOPY Bilateral yrs ago   right   LEFT HEART CATH AND CORONARY ANGIOGRAPHY Left 04/17/2018   Procedure: LEFT HEART CATH AND CORONARY ANGIOGRAPHY;  Surgeon: Marcina Millard, MD;  Location: [facility] INVASIVE CV LAB;  Service: Cardiovascular;  Laterality: Left;   SHOULDER ARTHROSCOPY WITH SUBACROMIAL DECOMPRESSION Right 05/17/2018   Procedure: SHOULDER ARTHROSCOPY WITH SUBACROMIAL DECOMPRESSION;  Surgeon: Jones Broom, MD;  Location: [facility] OR;  Service: Orthopedics;  Laterality: Right;   THUMB ARTHROSCOPY Left age 50   left    VENTRAL HERNIA REPAIR  12/08/2011   Procedure: LAPAROSCOPIC VENTRAL HERNIA;  Surgeon: Atilano Ina, MD,FACS;  Location: [facility] ORS;  Service: General;  Laterality: N/A;   WISDOM TOOTH EXTRACTION      Family History  Problem Relation Age of Onset   Hypertension Mother    Cancer Father        prostate   Heart disease Neg Hx    Colon cancer Neg Hx    Diabetes Neg Hx     Social History:  reports that he has never smoked. He has never been exposed to tobacco smoke. He has never used smokeless tobacco. He reports  current alcohol use. He reports that he does not use drugs.  Allergies:  Allergies  Allergen Reactions   Latex Itching    Medications reviewed.    ROS Full ROS performed and is otherwise negative other than what is stated in HPI   BP (!) 141/91 (BP Location: Right Arm, Patient Position: Sitting, Cuff Size: Large)   Pulse 62   Temp 98.4 F (36.9 C) (Oral)   Ht 5' 11.75" (1.822 m)   Wt (!) 389 lb 9.6 oz (176.7 kg)   SpO2 96%   BMI 53.21 kg/m   Physical Exam CONSTITUTIONAL: NAD , BMI 51. EYES: Pupils are equal, round,  Sclera are non-icteric. EARS, NOSE, MOUTH AND THROAT:  The oral mucosa is pink and moist. Hearing is intact to voice. GI: The abdomen is  soft, nontender, and nondistended. There are no palpable masses. There is no hepatosplenomegaly. There are normal bowel sounds in all quadrants. GU: Rectal deferred.   MUSCULOSKELETAL: Normal muscle strength and tone. No cyanosis or edema.   SKIN: There is evidence of an epidermal inclusion cyst on the left back. No evidence of infection or drainage NEUROLOGIC: Motor and sensation is grossly normal. Cranial nerves are grossly intact. PSYCH:  Oriented to person, place and time. Affect is normal.  Assessment/Plan: 57 year old male with prior  infected epidermal inclusion cyst in the back, infection resolved.  Pt wishes to have this excised. D/W him in detail about the procedure, risks, benefits and possible complications. We can arrange for excision here in the office under local in a couple of weeks  Please note I spent 20 minutes in this encounter including person reviewing medical records, counseling the patient, placing orders and performing documentation   Sterling Big, MD [facility] General Surgeon

## 2023-04-11 NOTE — Progress Notes (Unsigned)
    Steve Cabriales T. Candas Deemer, MD, CAQ Sports Medicine [facility] at [facility] [address] [place], [address]  Phone: [phone]  FAX: [phone]  Steve Ashley - 57 y.o. male  MRN [id number]  Date of Birth: Aug 05, 1965  Date: 04/12/2023  PCP: Hannah Beat, MD  Referral: Hannah Beat, MD  No chief complaint on file.  Subjective:   Steve Ashley is a 57 y.o. very pleasant male patient with There is no height or weight on file to calculate BMI. who presents with the following:  He is a very nice patient who I have known for many years.  He presents today with some ongoing joint pains.    Review of Systems is noted in the HPI, as appropriate  Objective:   There were no vitals taken for this visit.  GEN: No acute distress; alert,appropriate. PULM: Breathing comfortably in no respiratory distress PSYCH: Normally interactive.   Laboratory and Imaging Data:  Assessment and Plan:   ***

## 2023-04-12 ENCOUNTER — Encounter: Payer: Self-pay | Admitting: Family Medicine

## 2023-04-12 ENCOUNTER — Ambulatory Visit: Payer: BC Managed Care – PPO | Admitting: Family Medicine

## 2023-04-12 VITALS — BP 110/80 | HR 86 | Temp 98.0°F | Ht 72.0 in | Wt 391.5 lb

## 2023-04-12 DIAGNOSIS — R21 Rash and other nonspecific skin eruption: Secondary | ICD-10-CM

## 2023-04-12 DIAGNOSIS — Z6841 Body Mass Index (BMI) 40.0 and over, adult: Secondary | ICD-10-CM

## 2023-04-12 DIAGNOSIS — N5089 Other specified disorders of the male genital organs: Secondary | ICD-10-CM

## 2023-04-12 DIAGNOSIS — R7303 Prediabetes: Secondary | ICD-10-CM

## 2023-04-12 LAB — POCT GLYCOSYLATED HEMOGLOBIN (HGB A1C): Hemoglobin A1C: 6.2 % — AB (ref 4.0–5.6)

## 2023-04-12 MED ORDER — ESCITALOPRAM OXALATE 20 MG PO TABS: 20.0000 mg | ORAL_TABLET | Freq: Every day | ORAL | 3 refills | Status: DC

## 2023-04-12 MED ORDER — BETAMETHASONE VALERATE 0.1 % EX OINT: 1.0000 | TOPICAL_OINTMENT | Freq: Two times a day (BID) | CUTANEOUS | 3 refills | Status: AC

## 2023-04-13 ENCOUNTER — Encounter: Payer: Self-pay | Admitting: *Deleted

## 2023-04-17 ENCOUNTER — Ambulatory Visit (INDEPENDENT_AMBULATORY_CARE_PROVIDER_SITE_OTHER): Payer: BC Managed Care – PPO | Admitting: Surgery

## 2023-04-17 ENCOUNTER — Encounter: Payer: Self-pay | Admitting: *Deleted

## 2023-04-17 ENCOUNTER — Encounter: Payer: Self-pay | Admitting: Surgery

## 2023-04-17 VITALS — BP 126/87 | HR 61 | Temp 98.4°F | Ht 72.0 in | Wt 385.2 lb

## 2023-04-17 DIAGNOSIS — L720 Epidermal cyst: Secondary | ICD-10-CM | POA: Diagnosis not present

## 2023-04-17 NOTE — Progress Notes (Signed)
DIAGNOSIS Symptomatic soft tissue mass Back  PROCEDURES 1.  Excision of epidermal cyst back 31mm 2.  Intermediate closure measuring 60mm  ANESTHESIA: Lidocaine 1% with epinephrine  EBL: Minimal  FINDINGS: epidermal cyst  After informed consent was obtained the patient was prepped and draped in the usual sterile fashion.  Lidocaine 1% was injected over the area of interest.  15 blade knife used to create an incision and the subcutaneous tissue was dissected free with hemostats.  The mass was dissected free from adjacent structures using Metzenbaum scissors.  It was sent for permanent pathology.  Hemostasis obtained with pressure.  The wound was closed in a 2 layer fashion with dermal layer using interrupted 3-0 Vicryl.  The skin was closed in a subcuticular fashion using 4-0 Monocryl.  Dermabond was applied.  No complications. The  patient tolerated procedure well

## 2023-04-17 NOTE — Patient Instructions (Signed)
 We have removed a Cyst in our office today.  You have sutures under the skin that will dissolve and also dermabond (skin glue) on top of your skin which will come off on it's own in 10-14 days.  You may use Ibuprofen or Tylenol as needed for pain control. Use the ice pack 3-4 times a day for the next two days for any achiness.  You may shower tomorrow, do not scrub at the area.  Avoid Strenuous activities that will make you sweat during the next 48 hours to avoid the glue coming off prematurely. Avoid activities that will place pressure to this area of the body for 1-2 weeks to avoid re-injury to incision site.  Please see your follow-up appointment provided. We will see you back in office to make sure this area is healed and to review the final pathology. If you have any questions or concerns prior to this appointment, call our office and speak with a nurse.    Excision of Skin Cysts or Lesions Excision of a skin lesion refers to the removal of a section of skin by making small cuts (incisions) in the skin. This procedure may be done to remove a cancerous (malignant) or noncancerous (benign) growth on the skin. It is typically done to treat or prevent cancer or infection. It may also be done to improve cosmetic appearance. The procedure may be done to remove: Cancerous growths, such as basal cell carcinoma, squamous cell carcinoma, or melanoma. Noncancerous growths, such as a cyst or lipoma. Growths, such as moles or skin tags, which may be removed for cosmetic reasons.  Various excision or surgical techniques may be used depending on your condition, the location of the lesion, and your overall health. Tell a health care provider about: Any allergies you have. All medicines you are taking, including vitamins, herbs, eye drops, creams, and over-the-counter medicines. Any problems you or family members have had with anesthetic medicines. Any blood disorders you have. Any surgeries you have  had. Any medical conditions you have. Whether you are pregnant or may be pregnant. What are the risks? Generally, this is a safe procedure. However, problems may occur, including: Bleeding. Infection. Scarring. Recurrence of the cyst, lipoma, or cancer. Changes in skin sensation or appearance, such as discoloration or swelling. Reaction to the anesthetics. Allergic reaction to surgical materials or ointments. Damage to nerves, blood vessels, muscles, or other structures. Continued pain.  What happens before the procedure? Ask your health care provider about: Changing or stopping your regular medicines. This is especially important if you are taking diabetes medicines or blood thinners. Taking medicines such as aspirin and ibuprofen. These medicines can thin your blood. Do not take these medicines before your procedure if your health care provider instructs you not to. You may be asked to take certain medicines. You may be asked to stop smoking. You may have an exam or testing. What happens during the procedure? To reduce your risk of infection: Your health care team will wash or sanitize their hands. Your skin will be washed with soap. You will be given a medicine to numb the area (local anesthetic). One of the following excision techniques will be performed. At the end of any of these procedures, antibiotic ointment will be applied as needed. Each of the following techniques may vary among health care providers and hospitals. Complete Surgical Excision The area of skin that needs to be removed will be marked with a pen. Using a small scalpel or scissors, the surgeon  will gently cut around and under the lesion until it is completely removed. The lesion will be placed in a fluid and sent to the lab for examination. If necessary, bleeding will be controlled with a device that delivers heat (electrocautery). The edges of the wound may be stitched (sutured) together, and a bandage  (dressing) or surgical glue will be applied. This procedure may be performed to treat a cancerous growth or a noncancerous cyst or lesion.  What happens after the procedure? Return to your normal activities as told by your health care provider. Report any excessive bleeding, spreading redness, or increased pain.

## 2023-04-20 LAB — SURGICAL PATHOLOGY

## 2023-06-26 ENCOUNTER — Other Ambulatory Visit: Payer: Self-pay | Admitting: Family Medicine

## 2023-08-31 ENCOUNTER — Ambulatory Visit (INDEPENDENT_AMBULATORY_CARE_PROVIDER_SITE_OTHER): Payer: Self-pay | Admitting: Family Medicine

## 2023-08-31 ENCOUNTER — Encounter: Payer: Self-pay | Admitting: Family Medicine

## 2023-08-31 VITALS — BP 110/86 | HR 119 | Temp 97.5°F | Ht 72.0 in | Wt 383.2 lb

## 2023-08-31 DIAGNOSIS — R7303 Prediabetes: Secondary | ICD-10-CM

## 2023-08-31 DIAGNOSIS — Z6841 Body Mass Index (BMI) 40.0 and over, adult: Secondary | ICD-10-CM

## 2023-08-31 DIAGNOSIS — N5089 Other specified disorders of the male genital organs: Secondary | ICD-10-CM

## 2023-08-31 LAB — POCT GLYCOSYLATED HEMOGLOBIN (HGB A1C): Hemoglobin A1C: 5.9 % — AB (ref 4.0–5.6)

## 2023-08-31 MED ORDER — METFORMIN HCL 500 MG PO TABS: 500.0000 mg | ORAL_TABLET | Freq: Two times a day (BID) | ORAL | 3 refills | Status: DC

## 2023-08-31 NOTE — Progress Notes (Unsigned)
     Appollonia Klee T. Nichoals Heyde, MD, CAQ Sports Medicine [facility] at [facility] [address] [place], [address]  Phone: [phone]  FAX: [phone]  LYRIQ JARCHOW - 58 y.o. male  MRN [id number]  Date of Birth: 1966-05-18  Date: 08/31/2023  PCP: Hannah Beat, MD  Referral: Hannah Beat, MD  Chief Complaint  Patient presents with   Prediabetes   Skin Tag   Subjective:   Steve Ashley is a 58 y.o. very pleasant male patient with Body mass index is 51.98 kg/m. who presents with the following:  Skin tag on the bottom of testicle - ? Ightcolored lesion on the testicle  Lab Results  Component Value Date   HGBA1C 5.9 (A) 08/31/2023    Down to 377 -     Review of Systems is noted in the HPI, as appropriate  Objective:   BP 110/86 (BP Location: Left Arm, Patient Position: Sitting, Cuff Size: Large)   Pulse (!) 119   Temp (!) 97.5 F (36.4 C) (Temporal)   Ht 6' (1.829 m)   Wt (!) 383 lb 4 oz (173.8 kg)   SpO2 96%   BMI 51.98 kg/m   GEN: No acute distress; alert,appropriate. PULM: Breathing comfortably in no respiratory distress PSYCH: Normally interactive.   Laboratory and Imaging Data:  Assessment and Plan:   ***

## 2023-09-01 ENCOUNTER — Encounter: Payer: Self-pay | Admitting: Family Medicine

## 2023-09-13 ENCOUNTER — Emergency Department

## 2023-09-13 ENCOUNTER — Emergency Department
Admission: EM | Admit: 2023-09-13 | Discharge: 2023-09-13 | Disposition: A | Discharge status: Home / Self Care | Attending: Emergency Medicine | Admitting: Emergency Medicine

## 2023-09-13 ENCOUNTER — Other Ambulatory Visit: Payer: Self-pay

## 2023-09-13 DIAGNOSIS — L0291 Cutaneous abscess, unspecified: Secondary | ICD-10-CM

## 2023-09-13 DIAGNOSIS — L03012 Cellulitis of left finger: Secondary | ICD-10-CM | POA: Diagnosis not present

## 2023-09-13 DIAGNOSIS — L02512 Cutaneous abscess of left hand: Secondary | ICD-10-CM | POA: Insufficient documentation

## 2023-09-13 DIAGNOSIS — M7989 Other specified soft tissue disorders: Secondary | ICD-10-CM | POA: Diagnosis present

## 2023-09-13 LAB — COMPREHENSIVE METABOLIC PANEL WITH GFR
ALT: 15 U/L (ref 0–44)
AST: 18 U/L (ref 15–41)
Albumin: 4.1 g/dL (ref 3.5–5.0)
Alkaline Phosphatase: 63 U/L (ref 38–126)
Anion gap: 8 (ref 5–15)
BUN: 9 mg/dL (ref 6–20)
CO2: 26 mmol/L (ref 22–32)
Calcium: 9.4 mg/dL (ref 8.9–10.3)
Chloride: 104 mmol/L (ref 98–111)
Creatinine, Ser: 0.99 mg/dL (ref 0.61–1.24)
GFR, Estimated: >60 mL/min (ref >60)
Glucose, Bld: 105 mg/dL — ABNORMAL HIGH (ref 70–99)
Potassium: 4.5 mmol/L (ref 3.5–5.1)
Sodium: 138 mmol/L (ref 135–145)
Total Bilirubin: 1 mg/dL (ref 0.0–1.2)
Total Protein: 8.7 g/dL — ABNORMAL HIGH (ref 6.5–8.1)

## 2023-09-13 LAB — CBC WITH DIFFERENTIAL/PLATELET
Abs Immature Granulocytes: 0.02 10*3/uL (ref 0.00–0.07)
Basophils Absolute: 0.1 10*3/uL (ref 0.0–0.1)
Basophils Relative: 1 %
Eosinophils Absolute: 0.2 10*3/uL (ref 0.0–0.5)
Eosinophils Relative: 2 %
HCT: 47.9 % (ref 39.0–52.0)
Hemoglobin: 15.4 g/dL (ref 13.0–17.0)
Immature Granulocytes: 0 %
Lymphocytes Relative: 30 %
Lymphs Abs: 2.6 10*3/uL (ref 0.7–4.0)
MCH: 28.9 pg (ref 26.0–34.0)
MCHC: 32.2 g/dL (ref 30.0–36.0)
MCV: 90 fL (ref 80.0–100.0)
Monocytes Absolute: 0.9 10*3/uL (ref 0.1–1.0)
Monocytes Relative: 11 %
Neutro Abs: 4.8 10*3/uL (ref 1.7–7.7)
Neutrophils Relative %: 56 %
Platelets: 291 10*3/uL (ref 150–400)
RBC: 5.32 MIL/uL (ref 4.22–5.81)
RDW: 15 % (ref 11.5–15.5)
WBC: 8.5 10*3/uL (ref 4.0–10.5)
nRBC: 0 % (ref 0.0–0.2)

## 2023-09-13 LAB — LACTIC ACID, PLASMA: Lactic Acid, Venous: 1.7 mmol/L (ref 0.5–1.9)

## 2023-09-13 MED ORDER — SULFAMETHOXAZOLE-TRIMETHOPRIM 800-160 MG PO TABS
1.0000 | ORAL_TABLET | Freq: Once | ORAL | Status: AC
  Administered 2023-09-13: 1 via ORAL
  Filled 2023-09-13: qty 1

## 2023-09-13 MED ORDER — SULFAMETHOXAZOLE-TRIMETHOPRIM 800-160 MG PO TABS
1.0000 | ORAL_TABLET | Freq: Two times a day (BID) | ORAL | 0 refills | Status: DC
Start: 2023-09-13 — End: 2023-09-28

## 2023-09-13 MED ORDER — LIDOCAINE HCL (PF) 1 % IJ SOLN
5.0000 mL | Freq: Once | INTRAMUSCULAR | Status: AC
  Administered 2023-09-13: 5 mL
  Filled 2023-09-13: qty 5

## 2023-09-13 NOTE — ED Triage Notes (Signed)
 Pt states sent from [facility] clinic with L middle finger infection, area swollen and weeping, pt unsure of insect bite but denies injury. [facility] clinic states he may have a osteo. Pt denies fevers. NAD noted.

## 2023-09-13 NOTE — Discharge Instructions (Signed)
 Keep the wound clean, dry, covered.  Take antibiotic twice daily as prescribed.  Apply warm compress to help reduce swelling and promote healing.  See your primary provider for interim wound check.

## 2023-09-13 NOTE — ED Notes (Signed)
 ED Provider at bedside performing I&D with PA student.

## 2023-09-13 NOTE — ED Provider Notes (Signed)
 [facility] Emergency Department Provider Note     Event Date/Time   First MD Initiated Contact with Patient 09/13/23 1614     (approximate)   History   Wound Infection   HPI  Steve Ashley is a 58 y.o. male presents to the ED from [facility], for evaluation of a left middle finger infection.  Patient presents with swelling, edema, purulent drainage from his dorsal left middle finger, after what he assumes was an insect bite last week.  Patient does admit to manipulating the area and squeezing the wound, after which he began to experience increased swelling to the dorsal finger and hand.  He initially presented to [facility] for evaluation, but they advised patient be seen in the ED, for concern for osteomyelitis to the finger.  Patient presents with no fevers reported.  He is endorsing some disability with full range of motion secondary to swelling.  Patient does not endorse any previous finger injuries or reports of the same cellulitis or abscess.  Physical Exam   Triage Vital Signs: ED Triage Vitals  Encounter Vitals Group     BP 09/13/23 1559 (!) 149/94     Systolic BP Percentile --      Diastolic BP Percentile --      Pulse Rate 09/13/23 1559 89     Resp 09/13/23 1559 (!) 21     Temp 09/13/23 1559 97.8 F (36.6 C)     Temp Source 09/13/23 1559 Oral     SpO2 09/13/23 1559 100 %     Weight 09/13/23 1600 (!) 379 lb (171.9 kg)     Height 09/13/23 1600 6' (1.829 m)     Head Circumference --      Peak Flow --      Pain Score 09/13/23 1600 7     Pain Loc --      Pain Education --      Exclude from Growth Chart --     Most recent vital signs: Vitals:   09/13/23 1559  BP: (!) 149/94  Pulse: 89  Resp: (!) 21  Temp: 97.8 F (36.6 C)  SpO2: 100%    General Awake, no distress. NAD HEENT NCAT. PERRL. EOMI. No rhinorrhea. Mucous membranes are moist.  CV:  Good peripheral perfusion.  RESP:  Normal effort.  ABD:  No distention.  MSK:  Left middle  finger with a dorsal proximal phalanx wound and soft tissue swelling.  Significant edema is noted to the entirety of the finger extending over the dorsum of the hand.  Seropurulent drainage is noted from the dorsal wound.  Normal composite fist limited only by soft tissue swelling. NEURO: Normal gross sensation.   ED Results / Procedures / Treatments   Labs (all labs ordered are listed, but only abnormal results are displayed) Labs Reviewed  COMPREHENSIVE METABOLIC PANEL WITH GFR - Abnormal; Notable for the following components:      Result Value   Glucose, Bld 105 (*)    Total Protein 8.7 (*)    All other components within normal limits  AEROBIC/ANAEROBIC CULTURE W GRAM STAIN (SURGICAL/DEEP WOUND)  CBC WITH DIFFERENTIAL/PLATELET  LACTIC ACID, PLASMA     EKG   RADIOLOGY  I personally viewed and evaluated these images as part of my medical decision making, as well as reviewing the written report by the radiologist.  ED Provider Interpretation: No evidence of radiopaque foreign body or bony erosive changes.  Soft tissue swelling noted.  DG Finger Middle  Left Result Date: 09/13/2023 CLINICAL DATA:  Pain and swelling. EXAM: LEFT MIDDLE FINGER 3V COMPARISON:  None Available. FINDINGS: No fracture or dislocation. Preserved bone mineralization. Minimal degenerative changes of the distal interphalangeal joint. IMPRESSION: Slight degenerative changes of the distal interphalangeal joint. Electronically Signed   By: Karen Kays M.D.   On: 09/13/2023 19:32     PROCEDURES:  Critical Care performed: No  .Incision and Drainage  Date/Time: 09/13/2023 6:01 PM  Performed by: Lissa Hoard, PA-C Authorized by: Lissa Hoard, PA-C   Consent:    Consent obtained:  Verbal   Consent given by:  Patient   Risks, benefits, and alternatives were discussed: yes     Risks discussed:  Bleeding, incomplete drainage and pain Universal protocol:    Imaging studies available: yes      Site/side marked: yes     Patient identity confirmed:  Verbally with patient Location:    Type:  Abscess   Size:  2   Location:  Upper extremity   Upper extremity location:  Finger   Finger location:  L index finger Pre-procedure details:    Skin preparation:  Povidone-iodine Sedation:    Sedation type:  None Anesthesia:    Anesthesia method:  Local infiltration   Local anesthetic:  Lidocaine 1% w/o epi Procedure type:    Complexity:  Simple Procedure details:    Ultrasound guidance: no     Needle aspiration: no     Incision types:  Single straight   Incision depth:  Subcutaneous   Wound management:  Irrigated with saline   Drainage:  Purulent   Drainage amount:  Moderate   Wound treatment:  Wound left open   Packing materials:  None Post-procedure details:    Procedure completion:  Tolerated well, no immediate complications    MEDICATIONS ORDERED IN ED: Medications  sulfamethoxazole-trimethoprim (BACTRIM DS) 800-160 MG per tablet 1 tablet (1 tablet Oral Given 09/13/23 1756)  lidocaine (PF) (XYLOCAINE) 1 % injection 5 mL (5 mLs Infiltration Given 09/13/23 1831)     IMPRESSION / MDM / ASSESSMENT AND PLAN / ED COURSE  I reviewed the triage vital signs and the nursing notes.                              Differential diagnosis includes, but is not limited to, abscess, cellulitis, tenosynovitis, retained foreign body, septic arthritis, gout  Patient's presentation is most consistent with acute presentation with potential threat to life or bodily function.   Patient's diagnosis is consistent with acute cellulitis and abscess the long finger of the left hand.  Patient presents for evaluation of progressive swelling and purulent drainage from the proximal findings of the hand.  No fevers noted in the interim.  Reevaluation without evidence of bony erosion or retained foreign body.  Patient consents to a local I&D procedure which is performed with meaningful expression of  seropurulent drainage.  Patient will be discharged home with prescriptions for Bactrim. Patient is to follow up with his PCP as discussed, as needed or otherwise directed. Patient is given ED precautions to return to the ED for any worsening or new symptoms.     FINAL CLINICAL IMPRESSION(S) / ED DIAGNOSES   Final diagnoses:  Cellulitis of finger of left hand  Abscess     Rx / DC Orders   ED Discharge Orders          Ordered    sulfamethoxazole-trimethoprim (  BACTRIM DS) 800-160 MG tablet  2 times daily        09/13/23 1847             Note:  This document was prepared using Dragon voice recognition software and may include unintentional dictation errors.    May Sparks, PA-C 09/13/23 2215    Shane Darling, MD 09/13/23 [phone]

## 2023-09-13 NOTE — ED Triage Notes (Signed)
 From [facility] for evaluation of wound on hand.  Symptoms have been ongoing x 1 week..  VS wnl.

## 2023-09-18 LAB — AEROBIC/ANAEROBIC CULTURE W GRAM STAIN (SURGICAL/DEEP WOUND)

## 2023-09-21 ENCOUNTER — Encounter: Payer: Self-pay | Admitting: Urology

## 2023-09-27 ENCOUNTER — Other Ambulatory Visit: Payer: Self-pay | Admitting: Family Medicine

## 2023-09-27 NOTE — Telephone Encounter (Signed)
 Please schedule CPE with fasting labs prior with Dr. Patsy Lager.

## 2023-09-27 NOTE — Telephone Encounter (Signed)
 LVM to schedule CPE with Prior fasting labs with Dr Geralyn Knee

## 2023-09-28 ENCOUNTER — Ambulatory Visit: Admitting: Urology

## 2023-09-28 VITALS — BP 121/88 | HR 102 | Ht 72.0 in | Wt 374.4 lb

## 2023-09-28 DIAGNOSIS — N4883 Acquired buried penis: Secondary | ICD-10-CM

## 2023-09-28 DIAGNOSIS — R350 Frequency of micturition: Secondary | ICD-10-CM

## 2023-09-28 DIAGNOSIS — R3129 Other microscopic hematuria: Secondary | ICD-10-CM

## 2023-09-28 DIAGNOSIS — N5089 Other specified disorders of the male genital organs: Secondary | ICD-10-CM | POA: Diagnosis not present

## 2023-09-28 LAB — BLADDER SCAN AMB NON-IMAGING: Scan Result: 20

## 2023-09-28 MED ORDER — TAMSULOSIN HCL 0.4 MG PO CAPS: 0.4000 mg | ORAL_CAPSULE | Freq: Every day | ORAL | 11 refills | Status: AC

## 2023-09-28 NOTE — Progress Notes (Signed)
 Elfrieda Grise Plume,acting as a scribe for Dustin Gimenez, MD.,have documented all relevant documentation on the behalf of Dustin Gimenez, MD,as directed by  Dustin Gimenez, MD while in the presence of Dustin Gimenez, MD.  09/28/23 2:33 PM   Steve Ashley 03-13-1966 [id number]  Referring provider: Scherrie Curt, MD [address] [place],  [place] [address]  Chief Complaint  Patient presents with   testicle lump    HPI: 58 year-old male who presents with concerns about a lump in his scrotum and urinary symptoms. He reports initially noticing a small growth on the bottom of his scrotum, which he thought was a pimple. It popped once, and he expressed concern about it.   He also reports urinary urgency, especially when getting out of the car, and uses a urinal at night, urinating approximately three times. He denies seeing blood in his urine but has a history of kidney stones. The urgency and frequency of urination have been ongoing for about two years, which he attributed to aging. 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 He is concerned about the presence of blood in his urine sample, which may be due to contamination. He is scheduled to provide another urine sample and is agreeable to further workup if necessary.  His urinalysis today has >30 RBC, no WBCs, >10 epithelial cells.  He has a history of sleep apnea (noncompliant with CPAP ) and is prediabetic. He has not smoked, except for trying a cigarette once.   Results for orders placed or performed in visit on 09/28/23  Bladder Scan (Post Void Residual) in office  Result Value Ref Range   Scan Result 20 ml     IPSS     Row Name 09/28/23 1400         International Prostate Symptom Score   How often have you had the sensation of not emptying your bladder? More than half the time     How often have you had to urinate less than every two hours? More than half the time     How often have you found  you stopped and started again several times when you urinated? About half the time     How often have you found it difficult to postpone urination? Almost always     How often have you had a weak urinary stream? More than half the time     How often have you had to strain to start urination? About half the time     How many times did you typically get up at night to urinate? 3 Times     Total IPSS Score 26       Quality of Life due to urinary symptoms   If you were to spend the rest of your life with your urinary condition just the way it is now how would you feel about that? Mostly Disatisfied              Score:  1-7 Mild 8-19 Moderate 20-35 Severe  PMH: Past Medical History:  Diagnosis Date   Asthma     hx of childhood asthma, none current   CAD (coronary artery disease), native coronary artery 11/22/2021   Essential hypertension 04/16/2018   Hyperlipidemia    Hypothyroidism    Morbid obesity (HCC)    NSTEMI (non-ST elevated myocardial infarction) (HCC)    OSA (obstructive sleep apnea) 08/29/2012   NPSG 10/2013:  AHI 75/hr with desat 58%  Surgical History: Past Surgical History:  Procedure Laterality Date   ANKLE SURGERY  age 48   left ankle    BACK SURGERY     COLONOSCOPY     HERNIA REPAIR     Umbilical   KNEE ARTHROSCOPY Bilateral yrs ago   right   LEFT HEART CATH AND CORONARY ANGIOGRAPHY Left 04/17/2018   Procedure: LEFT HEART CATH AND CORONARY ANGIOGRAPHY;  Surgeon: Percival Brace, MD;  Location: [facility] INVASIVE CV LAB;  Service: Cardiovascular;  Laterality: Left;   SHOULDER ARTHROSCOPY WITH SUBACROMIAL DECOMPRESSION Right 05/17/2018   Procedure: SHOULDER ARTHROSCOPY WITH SUBACROMIAL DECOMPRESSION;  Surgeon: Sammye Cristal, MD;  Location: [facility] OR;  Service: Orthopedics;  Laterality: Right;   THUMB ARTHROSCOPY Left age 65   left    VENTRAL HERNIA REPAIR  12/08/2011   Procedure: LAPAROSCOPIC VENTRAL HERNIA;  Surgeon: Fran Imus, MD,FACS;  Location: [facility]  ORS;  Service: General;  Laterality: N/A;   WISDOM TOOTH EXTRACTION      Home Medications:  Allergies as of 09/28/2023       Reactions   Latex Itching        Medication List        Accurate as of Sep 28, 2023  2:33 PM. If you have any questions, ask your nurse or doctor.          STOP taking these medications    sulfamethoxazole -trimethoprim  800-160 MG tablet Commonly known as: BACTRIM  DS       TAKE these medications    betamethasone  valerate ointment 0.1 % Commonly known as: VALISONE  Apply 1 Application topically 2 (two) times daily.   diclofenac  75 MG EC tablet Commonly known as: VOLTAREN  TAKE 1 TABLET BY MOUTH EVERY DAY IN THE EVENING   escitalopram  20 MG tablet Commonly known as: LEXAPRO  Take 1 tablet (20 mg total) by mouth daily.   hydrochlorothiazide  25 MG tablet Commonly known as: HYDRODIURIL  Take 1 tablet (25 mg total) by mouth daily.   levothyroxine  100 MCG tablet Commonly known as: SYNTHROID  Take 1 tablet (100 mcg total) by mouth daily.   metFORMIN  500 MG tablet Commonly known as: GLUCOPHAGE  Take 1 tablet (500 mg total) by mouth 2 (two) times daily with a meal.   metoprolol  tartrate 25 MG tablet Commonly known as: LOPRESSOR  Take 1 tablet (25 mg total) by mouth 2 (two) times daily.   rosuvastatin  40 MG tablet Commonly known as: CRESTOR  TAKE 1 TABLET BY MOUTH DAILY AT 6 PM   tamsulosin  0.4 MG Caps capsule Commonly known as: FLOMAX  Take 1 capsule (0.4 mg total) by mouth daily.        Allergies:  Allergies  Allergen Reactions   Latex Itching    Family History: Family History  Problem Relation Age of Onset   Hypertension Mother    Cancer Father        prostate   Heart disease Neg Hx    Colon cancer Neg Hx    Diabetes Neg Hx     Social History:  reports that he has never smoked. He has never been exposed to tobacco smoke. He has never used smokeless tobacco. He reports current alcohol use. He reports that he does not use  drugs.   Physical Exam: BP 121/88   Pulse (!) 102   Ht 6' (1.829 m)   Wt (!) 374 lb 6 oz (169.8 kg)   BMI 50.77 kg/m   Constitutional:  Alert and oriented, No acute distress. HEENT: [place] AT, moist mucus membranes.  Trachea midline,  no masses. GU: Previous boil on scrotum, resolved. Buried penis, uncircumcised phallus Neurologic: Grossly intact, no focal deficits, moving all 4 extremities. Psychiatric: Normal mood and affect.   Assessment & Plan:    1. Urinary Symptoms with Possible Microscopic Hematuria - He presents with urinary urgency and frequency, and a contaminated urine sample showing blood and epithelial cells. The differential diagnosis includes urinary tract infection, kidney stones, or more serious conditions such as kidney or bladder cancer. - Obtain a clean urine sample on Monday to reassess for hematuria. - If blood is confirmed, a CT scan of the kidneys, ureters, and bladder will be ordered, and a cystoscopy will be scheduled to further evaluate the urinary tract.  - In the meantime, he will start on Flomax  to help alleviate urinary symptoms. He was advised to take Flomax  at night to minimize dizziness and was informed about potential side effects.  2. Resolved Boil on Scrotum - He had a previous boil on the scrotum, which has resolved. It was likely an ingrown hair or minor infection.  3. 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 Weight loss was discussed as a potential long-term solution. He was instructed on proper technique for providing a urine sample to avoid contamination.  Return in about 1 week (around 10/05/2023) for repeat urinalysis.  I have reviewed the above documentation for accuracy and completeness, and I agree with the above.   Dustin Gimenez, MD   [facility] [address] [name], [place] [address] [phone]

## 2023-09-29 LAB — URINALYSIS, COMPLETE
Bilirubin, UA: NEGATIVE
Glucose, UA: NEGATIVE
Ketones, UA: NEGATIVE
Leukocytes,UA: NEGATIVE
Nitrite, UA: NEGATIVE
Specific Gravity, UA: 1.03 (ref 1.005–1.030)
Urobilinogen, Ur: 0.2 mg/dL (ref 0.2–1.0)
pH, UA: 6 (ref 5.0–7.5)

## 2023-09-29 LAB — MICROSCOPIC EXAMINATION
Epithelial Cells (non renal): >10 /HPF — AB (ref 0–10)
RBC, Urine: >30 /HPF — AB (ref 0–2)

## 2023-09-29 NOTE — Telephone Encounter (Signed)
 Sent mychart message to schedule.

## 2023-10-02 ENCOUNTER — Other Ambulatory Visit

## 2023-10-02 DIAGNOSIS — N5089 Other specified disorders of the male genital organs: Secondary | ICD-10-CM

## 2023-10-02 DIAGNOSIS — R350 Frequency of micturition: Secondary | ICD-10-CM

## 2023-10-02 DIAGNOSIS — R3129 Other microscopic hematuria: Secondary | ICD-10-CM

## 2023-10-02 DIAGNOSIS — N4883 Acquired buried penis: Secondary | ICD-10-CM

## 2023-10-02 LAB — MICROSCOPIC EXAMINATION

## 2023-10-02 NOTE — Telephone Encounter (Signed)
 Lvmtcb, sent mychart message

## 2023-10-03 LAB — MICROSCOPIC EXAMINATION

## 2023-10-03 LAB — URINALYSIS, COMPLETE
Bilirubin, UA: NEGATIVE
Glucose, UA: NEGATIVE
Ketones, UA: NEGATIVE
Leukocytes,UA: NEGATIVE
Nitrite, UA: NEGATIVE
Protein,UA: NEGATIVE
Specific Gravity, UA: 1.02 (ref 1.005–1.030)
Urobilinogen, Ur: 0.2 mg/dL (ref 0.2–1.0)
pH, UA: 6.5 (ref 5.0–7.5)

## 2023-12-10 ENCOUNTER — Other Ambulatory Visit: Payer: Self-pay | Admitting: Family Medicine

## 2023-12-11 NOTE — Telephone Encounter (Signed)
 Please schedule CPE with fasting labs piror with Dr. Watt.

## 2023-12-11 NOTE — Telephone Encounter (Signed)
 Lvmtcb, sent mychart message

## 2023-12-13 NOTE — Telephone Encounter (Signed)
 Pt sch cpe for 12/20/23

## 2023-12-13 NOTE — Telephone Encounter (Signed)
 Lvmtcb

## 2023-12-17 NOTE — Progress Notes (Unsigned)
 Steve Ashley T. Steve Woolford, MD, CAQ Sports Medicine [facility] [address] [place], [address]  Phone: [phone]  FAX: [phone]  DRAVEN NATTER - 58 y.o. male  MRN [id number]  Date of Birth: January 20, 1966  Date: 12/20/2023  PCP: Watt Mirza, MD  Referral: Watt Mirza, MD  No chief complaint on file.  Patient Care Team: Watt Mirza, MD as PCP - General Subjective:   Steve Ashley is a 58 y.o. pleasant patient who presents with the following:  Preventative Health Maintenance Visit:  Health Maintenance Summary Reviewed and updated, unless pt declines services.  Tobacco History Reviewed. Alcohol: No concerns, no excessive use Exercise Habits: Some activity, rec at least 30 mins 5 times a week STD concerns: no risk or activity to increase risk Drug Use: None  Steve Ashley is a very well-known patient.  He does have a history of coronary disease, history of MI, hypertension, hyperlipidemia, morbid obesity, hypothyroidism.  Prevnar 20 COVID booster Annual flu vaccine  Health Maintenance  Topic Date Due   Pneumococcal Vaccine 6-43 Years old (1 of 2 - PCV) Never done   Hepatitis B Vaccines (1 of 3 - 19+ 3-dose series) Never done   COVID-19 Vaccine (3 - Pfizer risk series) 09/13/2019   INFLUENZA VACCINE  12/29/2023   Colonoscopy  12/17/2024   DTaP/Tdap/Td (3 - Td or Tdap) 10/01/2029   Hepatitis C Screening  Completed   HIV Screening  Completed   Zoster Vaccines- Shingrix   Completed   HPV VACCINES  Aged Out   Meningococcal B Vaccine  Aged Out   Immunization History  Administered Date(s) Administered   PFIZER(Purple Top)SARS-COV-2 Vaccination 07/26/2019, 08/16/2019   Tdap 10/19/2015, 10/02/2019   Zoster Recombinant(Shingrix ) 08/19/2021, 09/28/2022, 01/01/2023   Patient Active Problem List   Diagnosis Date Noted   CAD (Coronary Artery Disease) 11/22/2021    Priority: High   NSTEMI (non-ST elevated myocardial  infarction) (HCC)     Priority: High   Essential hypertension 04/16/2018    Priority: Medium    BMI 50.0-59.9, adult (HCC) 05/09/2013    Priority: Medium    OSA (obstructive sleep apnea) 08/29/2012    Priority: Medium    Hypothyroidism 01/06/2010    Priority: Medium    Hyperlipidemia LDL goal <70 01/06/2010    Priority: Medium    Prediabetes 09/27/2022   Depression, major, single episode, moderate (HCC) 03/09/2017   Family history of prostate cancer 10/10/2013   HALLUX RIGIDUS 07/20/2009    Past Medical History:  Diagnosis Date   Asthma     hx of childhood asthma, none current   CAD (coronary artery disease), native coronary artery 11/22/2021   Essential hypertension 04/16/2018   Hyperlipidemia    Hypothyroidism    Morbid obesity (HCC)    NSTEMI (non-ST elevated myocardial infarction) (HCC)    OSA (obstructive sleep apnea) 08/29/2012   NPSG 10/2013:  AHI 75/hr with desat 58%    Past Surgical History:  Procedure Laterality Date   ANKLE SURGERY  age 43   left ankle    BACK SURGERY     COLONOSCOPY     HERNIA REPAIR     Umbilical   KNEE ARTHROSCOPY Bilateral yrs ago   right   LEFT HEART CATH AND CORONARY ANGIOGRAPHY Left 04/17/2018   Procedure: LEFT HEART CATH AND CORONARY ANGIOGRAPHY;  Surgeon: Ammon Blunt, MD;  Location: [facility] INVASIVE CV LAB;  Service: Cardiovascular;  Laterality: Left;   SHOULDER ARTHROSCOPY WITH SUBACROMIAL DECOMPRESSION Right 05/17/2018  Procedure: SHOULDER ARTHROSCOPY WITH SUBACROMIAL DECOMPRESSION;  Surgeon: Dozier Soulier, MD;  Location: [facility] OR;  Service: Orthopedics;  Laterality: Right;   THUMB ARTHROSCOPY Left age 68   left    VENTRAL HERNIA REPAIR  12/08/2011   Procedure: LAPAROSCOPIC VENTRAL HERNIA;  Surgeon: Camellia CHRISTELLA Blush, MD,FACS;  Location: [facility] ORS;  Service: General;  Laterality: N/A;   WISDOM TOOTH EXTRACTION      Family History  Problem Relation Age of Onset   Hypertension Mother    Cancer Father        prostate   Heart  disease Neg Hx    Colon cancer Neg Hx    Diabetes Neg Hx     Social History   Social History Narrative   Divorced   2 sons   [employer] Runner, broadcasting/film/video at [employer], television/film/video at [employer]    Past Medical History, Surgical History, Social History, Family History, Problem List, Medications, and Allergies have been reviewed and updated if relevant.  Review of Systems: Pertinent positives are listed above.  Otherwise, a full 14 point review of systems has been done in full and it is negative except where it is noted positive.  Objective:   There were no vitals taken for this visit. Ideal Body Weight:    Ideal Body Weight:   No results found.    09/28/2022    8:43 AM 10/12/2017    9:00 AM 04/24/2017    4:22 PM 03/09/2017   11:51 AM 01/26/2017    3:37 PM  Depression screen PHQ 2/9  Decreased Interest 3 0 1 1   Down, Depressed, Hopeless 1 0 0 0 1  PHQ - 2 Score 4 0 1 1 1   Altered sleeping 2  1 1 2   Tired, decreased energy 2  1 2 2   Change in appetite 0  1 1 1   Feeling bad or failure about yourself  2  1 1 1   Trouble concentrating 0  0 1 1  Moving slowly or fidgety/restless 0  0 1 1  Suicidal thoughts 0  0 0 0  PHQ-9 Score 10  5 8 9   Difficult doing work/chores Somewhat difficult  Not difficult at all Somewhat difficult Somewhat difficult     GEN: well developed, well nourished, no acute distress Eyes: conjunctiva and lids normal, PERRLA, EOMI ENT: TM clear, nares clear, oral exam WNL Neck: supple, no lymphadenopathy, no thyromegaly, no JVD Pulm: clear to auscultation and percussion, respiratory effort normal CV: regular rate and rhythm, S1-S2, no murmur, rub or gallop, no bruits, peripheral pulses normal and symmetric, no cyanosis, clubbing, edema or varicosities GI: soft, non-tender; no hepatosplenomegaly, masses; active bowel sounds all quadrants GU: deferred Lymph: no cervical, axillary or inguinal adenopathy MSK: gait normal, muscle tone and  strength WNL, no joint swelling, effusions, discoloration, crepitus  SKIN: clear, good turgor, color WNL, no rashes, lesions, or ulcerations Neuro: normal mental status, normal strength, sensation, and motion Psych: alert; oriented to person, place and time, normally interactive and not anxious or depressed in appearance.  All labs reviewed with patient. Results for orders placed or performed in visit on 10/02/23  Microscopic Examination   Collection Time: 10/02/23  3:02 PM   Urine  Result Value Ref Range   WBC, UA 0-5 0 - 5 /hpf   RBC, Urine 0-2 0 - 2 /hpf   Epithelial Cells (non renal) 0-10 0 - 10 /hpf   Bacteria, UA None seen None seen/Few  Urinalysis, Complete   Collection Time: 10/02/23  3:02 PM  Result Value Ref Range   Specific Gravity, UA 1.020 1.005 - 1.030   pH, UA 6.5 5.0 - 7.5   Color, UA Yellow Yellow   Appearance Ur Slightly cloudy Clear   Leukocytes,UA Negative Negative   Protein,UA Negative Negative/Trace   Glucose, UA Negative Negative   Ketones, UA Negative Negative   RBC, UA Trace (A) Negative   Bilirubin, UA Negative Negative   Urobilinogen, Ur 0.2 0.2 - 1.0 mg/dL   Nitrite, UA Negative Negative   Microscopic Examination See below:     Assessment and Plan:     ICD-10-CM   1. Healthcare maintenance  Z00.00       Health Maintenance Exam: The patient's preventative maintenance and recommended screening tests for an annual wellness exam were reviewed in full today. Brought up to date unless services declined.  Counselled on the importance of diet, exercise, and its role in overall health and mortality. The patient's FH and SH was reviewed, including their home life, tobacco status, and drug and alcohol status.  Follow-up in 1 year for physical exam or additional follow-up below.  Disposition: No follow-ups on file.  No orders of the defined types were placed in this encounter.  There are no discontinued medications. No orders of the defined types  were placed in this encounter.   Signed,  Steve DASEN. Kaylia Winborne, MD   Allergies as of 12/20/2023       Reactions   Latex Itching        Medication List        Accurate as of December 17, 2023  3:27 PM. If you have any questions, ask your nurse or doctor.          betamethasone  valerate ointment 0.1 % Commonly known as: VALISONE  Apply 1 Application topically 2 (two) times daily.   diclofenac  75 MG EC tablet Commonly known as: VOLTAREN  TAKE 1 TABLET BY MOUTH EVERY DAY IN THE EVENING   escitalopram  20 MG tablet Commonly known as: LEXAPRO  Take 1 tablet (20 mg total) by mouth daily.   hydrochlorothiazide  25 MG tablet Commonly known as: HYDRODIURIL  Take 1 tablet (25 mg total) by mouth daily.   levothyroxine  100 MCG tablet Commonly known as: SYNTHROID  Take 1 tablet (100 mcg total) by mouth daily.   metFORMIN  500 MG tablet Commonly known as: GLUCOPHAGE  TAKE 1 TABLET BY MOUTH 2 TIMES DAILY WITH A MEAL.   metoprolol  tartrate 25 MG tablet Commonly known as: LOPRESSOR  Take 1 tablet (25 mg total) by mouth 2 (two) times daily.   rosuvastatin  40 MG tablet Commonly known as: CRESTOR  TAKE 1 TABLET BY MOUTH DAILY AT 6 PM   tamsulosin  0.4 MG Caps capsule Commonly known as: FLOMAX  Take 1 capsule (0.4 mg total) by mouth daily.

## 2023-12-20 ENCOUNTER — Encounter: Payer: Self-pay | Admitting: Family Medicine

## 2023-12-20 ENCOUNTER — Ambulatory Visit (INDEPENDENT_AMBULATORY_CARE_PROVIDER_SITE_OTHER): Admitting: Family Medicine

## 2023-12-20 VITALS — BP 90/64 | HR 89 | Temp 97.2°F | Ht 70.25 in | Wt 372.5 lb

## 2023-12-20 DIAGNOSIS — Z125 Encounter for screening for malignant neoplasm of prostate: Secondary | ICD-10-CM

## 2023-12-20 DIAGNOSIS — Z23 Encounter for immunization: Secondary | ICD-10-CM | POA: Diagnosis not present

## 2023-12-20 DIAGNOSIS — Z0000 Encounter for general adult medical examination without abnormal findings: Secondary | ICD-10-CM | POA: Diagnosis not present

## 2023-12-20 DIAGNOSIS — E038 Other specified hypothyroidism: Secondary | ICD-10-CM | POA: Diagnosis not present

## 2023-12-20 DIAGNOSIS — R7303 Prediabetes: Secondary | ICD-10-CM

## 2023-12-20 DIAGNOSIS — E785 Hyperlipidemia, unspecified: Secondary | ICD-10-CM

## 2023-12-20 LAB — LIPID PANEL
Cholesterol: 271 mg/dL — ABNORMAL HIGH (ref 0–200)
HDL: 44.9 mg/dL (ref >39.00)
LDL Cholesterol: 206 mg/dL — ABNORMAL HIGH (ref 0–99)
NonHDL: 225.64
Total CHOL/HDL Ratio: 6
Triglycerides: 96 mg/dL (ref 0.0–149.0)
VLDL: 19.2 mg/dL (ref 0.0–40.0)

## 2023-12-20 LAB — T3, FREE: T3, Free: 3.2 pg/mL (ref 2.3–4.2)

## 2023-12-20 LAB — T4, FREE: Free T4: 0.63 ng/dL (ref 0.60–1.60)

## 2023-12-20 LAB — HEMOGLOBIN A1C: Hgb A1c MFr Bld: 6.1 % (ref 4.6–6.5)

## 2023-12-20 LAB — TSH: TSH: 16.83 u[IU]/mL — ABNORMAL HIGH (ref 0.35–5.50)

## 2023-12-22 ENCOUNTER — Ambulatory Visit: Payer: Self-pay | Admitting: Family Medicine

## 2023-12-22 LAB — PSA, TOTAL WITH REFLEX TO PSA, FREE: PSA, Total: 0.7 ng/mL (ref <=4.0)

## 2024-04-20 ENCOUNTER — Other Ambulatory Visit: Payer: Self-pay | Admitting: Family Medicine

## 2024-04-22 NOTE — Telephone Encounter (Signed)
 Last office visit 12/20/2023 for CPE.  Last refilled 02/08/23 for #[id number] with 3 refills.  Next Appt: No future appointments.
